# Patient Record
Sex: Female | Born: 1950 | Race: Black or African American | Hispanic: No | Smoking: Never smoker
Health system: Southern US, Community
[De-identification: ages and names within clinical notes are randomized; demographics above are authoritative.]

## PROBLEM LIST (undated history)

## (undated) DIAGNOSIS — I471 Supraventricular tachycardia, unspecified: Secondary | ICD-10-CM

## (undated) DIAGNOSIS — E079 Disorder of thyroid, unspecified: Secondary | ICD-10-CM

## (undated) DIAGNOSIS — I472 Ventricular tachycardia: Secondary | ICD-10-CM

## (undated) DIAGNOSIS — E7800 Pure hypercholesterolemia, unspecified: Secondary | ICD-10-CM

## (undated) DIAGNOSIS — I4729 Other ventricular tachycardia: Secondary | ICD-10-CM

## (undated) DIAGNOSIS — R202 Paresthesia of skin: Secondary | ICD-10-CM

## (undated) DIAGNOSIS — I10 Essential (primary) hypertension: Secondary | ICD-10-CM

## (undated) DIAGNOSIS — H409 Unspecified glaucoma: Secondary | ICD-10-CM

## (undated) DIAGNOSIS — R42 Dizziness and giddiness: Secondary | ICD-10-CM

## (undated) DIAGNOSIS — Z789 Other specified health status: Secondary | ICD-10-CM

## (undated) DIAGNOSIS — I340 Nonrheumatic mitral (valve) insufficiency: Secondary | ICD-10-CM

## (undated) DIAGNOSIS — C50919 Malignant neoplasm of unspecified site of unspecified female breast: Secondary | ICD-10-CM

## (undated) DIAGNOSIS — R9431 Abnormal electrocardiogram [ECG] [EKG]: Secondary | ICD-10-CM

## (undated) HISTORY — DX: Ventricular tachycardia: I47.2

## (undated) HISTORY — PX: BREAST EXCISIONAL BIOPSY: SUR124

## (undated) HISTORY — DX: Other ventricular tachycardia: I47.29

## (undated) HISTORY — DX: Paresthesia of skin: R20.2

## (undated) HISTORY — DX: Dizziness and giddiness: R42

## (undated) HISTORY — DX: Supraventricular tachycardia: I47.1

## (undated) HISTORY — DX: Supraventricular tachycardia, unspecified: I47.10

## (undated) HISTORY — PX: ABDOMINAL HYSTERECTOMY: SHX81

## (undated) HISTORY — DX: Other specified health status: Z78.9

## (undated) HISTORY — DX: Abnormal electrocardiogram (ECG) (EKG): R94.31

## (undated) HISTORY — DX: Nonrheumatic mitral (valve) insufficiency: I34.0

---

## 2003-04-29 DIAGNOSIS — C50919 Malignant neoplasm of unspecified site of unspecified female breast: Secondary | ICD-10-CM

## 2003-04-29 HISTORY — PX: BREAST LUMPECTOMY: SHX2

## 2003-04-29 HISTORY — DX: Malignant neoplasm of unspecified site of unspecified female breast: C50.919

## 2014-06-09 DIAGNOSIS — D493 Neoplasm of unspecified behavior of breast: Secondary | ICD-10-CM | POA: Diagnosis not present

## 2014-06-09 DIAGNOSIS — R7301 Impaired fasting glucose: Secondary | ICD-10-CM | POA: Diagnosis not present

## 2014-06-09 DIAGNOSIS — I10 Essential (primary) hypertension: Secondary | ICD-10-CM | POA: Diagnosis not present

## 2014-06-09 DIAGNOSIS — M818 Other osteoporosis without current pathological fracture: Secondary | ICD-10-CM | POA: Diagnosis not present

## 2014-06-09 DIAGNOSIS — E784 Other hyperlipidemia: Secondary | ICD-10-CM | POA: Diagnosis not present

## 2014-06-12 ENCOUNTER — Other Ambulatory Visit (HOSPITAL_COMMUNITY): Payer: Self-pay | Admitting: Internal Medicine

## 2014-06-12 DIAGNOSIS — I159 Secondary hypertension, unspecified: Secondary | ICD-10-CM

## 2014-06-13 ENCOUNTER — Other Ambulatory Visit: Payer: Self-pay | Admitting: Internal Medicine

## 2014-06-13 DIAGNOSIS — Z853 Personal history of malignant neoplasm of breast: Secondary | ICD-10-CM

## 2014-06-14 ENCOUNTER — Ambulatory Visit (HOSPITAL_COMMUNITY)
Admission: RE | Admit: 2014-06-14 | Discharge: 2014-06-14 | Disposition: A | Discharge status: Home / Self Care | Payer: Medicare Other | Source: Ambulatory Visit | Attending: Internal Medicine | Admitting: Internal Medicine

## 2014-06-14 DIAGNOSIS — I159 Secondary hypertension, unspecified: Secondary | ICD-10-CM | POA: Diagnosis not present

## 2014-06-14 DIAGNOSIS — R0600 Dyspnea, unspecified: Secondary | ICD-10-CM

## 2014-06-14 NOTE — Progress Notes (Signed)
  Echocardiogram 2D Echocardiogram has been performed.  Donata Clay 06/14/2014, 1:28 PM

## 2014-06-16 DIAGNOSIS — H4011X1 Primary open-angle glaucoma, mild stage: Secondary | ICD-10-CM | POA: Diagnosis not present

## 2014-08-08 ENCOUNTER — Ambulatory Visit
Admission: RE | Admit: 2014-08-08 | Discharge: 2014-08-08 | Disposition: A | Discharge status: Home / Self Care | Payer: Medicare Other | Source: Ambulatory Visit | Attending: Internal Medicine | Admitting: Internal Medicine

## 2014-08-08 DIAGNOSIS — Z1231 Encounter for screening mammogram for malignant neoplasm of breast: Secondary | ICD-10-CM | POA: Diagnosis not present

## 2014-08-08 DIAGNOSIS — Z853 Personal history of malignant neoplasm of breast: Secondary | ICD-10-CM

## 2014-09-06 DIAGNOSIS — E784 Other hyperlipidemia: Secondary | ICD-10-CM | POA: Diagnosis not present

## 2014-09-06 DIAGNOSIS — I10 Essential (primary) hypertension: Secondary | ICD-10-CM | POA: Diagnosis not present

## 2014-09-06 DIAGNOSIS — N390 Urinary tract infection, site not specified: Secondary | ICD-10-CM | POA: Diagnosis not present

## 2014-09-06 DIAGNOSIS — R7301 Impaired fasting glucose: Secondary | ICD-10-CM | POA: Diagnosis not present

## 2014-09-07 ENCOUNTER — Emergency Department (HOSPITAL_COMMUNITY): Payer: Medicare Other

## 2014-09-07 ENCOUNTER — Encounter (HOSPITAL_COMMUNITY): Payer: Self-pay | Admitting: *Deleted

## 2014-09-07 ENCOUNTER — Emergency Department (HOSPITAL_COMMUNITY)
Admission: EM | Admit: 2014-09-07 | Discharge: 2014-09-07 | Disposition: A | Discharge status: Home / Self Care | Payer: Medicare Other | Attending: Emergency Medicine | Admitting: Emergency Medicine

## 2014-09-07 DIAGNOSIS — Z79899 Other long term (current) drug therapy: Secondary | ICD-10-CM | POA: Diagnosis not present

## 2014-09-07 DIAGNOSIS — H409 Unspecified glaucoma: Secondary | ICD-10-CM | POA: Insufficient documentation

## 2014-09-07 DIAGNOSIS — I10 Essential (primary) hypertension: Secondary | ICD-10-CM | POA: Insufficient documentation

## 2014-09-07 DIAGNOSIS — E780 Pure hypercholesterolemia: Secondary | ICD-10-CM | POA: Insufficient documentation

## 2014-09-07 DIAGNOSIS — R918 Other nonspecific abnormal finding of lung field: Secondary | ICD-10-CM | POA: Diagnosis not present

## 2014-09-07 DIAGNOSIS — I491 Atrial premature depolarization: Secondary | ICD-10-CM | POA: Diagnosis not present

## 2014-09-07 DIAGNOSIS — R51 Headache: Secondary | ICD-10-CM | POA: Diagnosis not present

## 2014-09-07 DIAGNOSIS — Z880 Allergy status to penicillin: Secondary | ICD-10-CM | POA: Insufficient documentation

## 2014-09-07 DIAGNOSIS — R42 Dizziness and giddiness: Secondary | ICD-10-CM | POA: Diagnosis not present

## 2014-09-07 HISTORY — DX: Unspecified glaucoma: H40.9

## 2014-09-07 HISTORY — DX: Pure hypercholesterolemia, unspecified: E78.00

## 2014-09-07 HISTORY — DX: Essential (primary) hypertension: I10

## 2014-09-07 LAB — PROTIME-INR
INR: 1.11 (ref 0.00–1.49)
Prothrombin Time: 14.5 seconds (ref 11.6–15.2)

## 2014-09-07 LAB — CBC
HEMATOCRIT: 39.4 % (ref 36.0–46.0)
Hemoglobin: 13.5 g/dL (ref 12.0–15.0)
MCH: 31.1 pg (ref 26.0–34.0)
MCHC: 34.3 g/dL (ref 30.0–36.0)
MCV: 90.8 fL (ref 78.0–100.0)
Platelets: 205 10*3/uL (ref 150–400)
RBC: 4.34 MIL/uL (ref 3.87–5.11)
RDW: 11.9 % (ref 11.5–15.5)
WBC: 5.4 10*3/uL (ref 4.0–10.5)

## 2014-09-07 LAB — I-STAT TROPONIN, ED: Troponin i, poc: 0 ng/mL (ref 0.00–0.08)

## 2014-09-07 LAB — BASIC METABOLIC PANEL
Anion gap: 11 (ref 5–15)
BUN: 11 mg/dL (ref 6–20)
CO2: 23 mmol/L (ref 22–32)
CREATININE: 0.8 mg/dL (ref 0.44–1.00)
Calcium: 8.9 mg/dL (ref 8.9–10.3)
Chloride: 105 mmol/L (ref 101–111)
GFR calc non Af Amer: >60 mL/min (ref >60)
Glucose, Bld: 114 mg/dL — ABNORMAL HIGH (ref 65–99)
Potassium: 3.6 mmol/L (ref 3.5–5.1)
Sodium: 139 mmol/L (ref 135–145)

## 2014-09-07 MED ORDER — SULFAMETHOXAZOLE-TRIMETHOPRIM 800-160 MG PO TABS: 1.0000 | ORAL_TABLET | Freq: Two times a day (BID) | ORAL | Status: DC

## 2014-09-07 MED ORDER — NITROFURANTOIN MONOHYD MACRO 100 MG PO CAPS: 100.0000 mg | ORAL_CAPSULE | Freq: Two times a day (BID) | ORAL | Status: DC

## 2014-09-07 NOTE — ED Provider Notes (Signed)
CSN: [id number]     Arrival date & time 09/07/14  1522 History   First MD Initiated Contact with Patient 09/07/14 1639     Chief Complaint  Patient presents with  . Dizziness  . Blurred Vision                                                                                                          HPI Pt started a new medication for a uti yesterday.  After that she started having symptoms of her heart skipping a beat and racing.  She felt lightheaded as well and blurred vision when the episode occurred.  She thinks the symptoms started each time after taking the abx.  It has persisted since yesterday.  No trouble with speech or coordination.  No CP or SOB.  No fevers.  No vomiting.  No history of heart disease.  SHe called her doctor in order to get a different medication and she was told to come to the ED. Past Medical History  Diagnosis Date  . Hypertension   . Glaucoma   . Hypercholesteremia    Past Surgical History  Procedure Laterality Date  . Abdominal hysterectomy    . Breast lumpectomy Bilateral    History reviewed. No pertinent family history. History  Substance Use Topics  . Smoking status: Never Smoker   . Smokeless tobacco: Never Used  . Alcohol Use: No   OB History    No data available     Review of Systems  Constitutional: Negative for fever.  Neurological: Positive for light-headedness and headaches. Negative for speech difficulty.  All other systems reviewed and are negative.     Allergies  Penicillins  Home Medications   Prior to Admission medications   Medication Sig Start Date End Date Taking? Authorizing Provider  ciprofloxacin (CIPRO) 250 MG tablet Take 250 mg by mouth every 12 (twelve) hours. 09/06/14  Yes Historical Provider, MD  latanoprost (XALATAN) 0.005 % ophthalmic solution Place 1 drop into both eyes at bedtime. 08/24/14  Yes Historical Provider, MD  simvastatin (ZOCOR) 20 MG tablet Take 20 mg by mouth at bedtime. 08/24/14  Yes Historical  Provider, MD  valsartan (DIOVAN) 160 MG tablet Take 160 mg by mouth daily. 08/24/14  Yes Historical Provider, MD   BP 161/136 mmHg  Pulse 62  Temp(Src) 98.3 F (36.8 C) (Oral)  Resp 18  Ht 5\' 3"  (1.6 m)  Wt 121 lb (54.885 kg)  BMI 21.44 kg/m2  SpO2 99% Physical Exam  Constitutional: She is oriented to person, place, and time. She appears well-developed and well-nourished. No distress.  HENT:  Head: Normocephalic and atraumatic.  Right Ear: External ear normal.  Left Ear: External ear normal.  Mouth/Throat: Oropharynx is clear and moist.  Eyes: Conjunctivae are normal. Right eye exhibits no discharge. Left eye exhibits no discharge. No scleral icterus.  Neck: Neck supple. No tracheal deviation present.  Cardiovascular: Normal rate, regular rhythm and intact distal pulses.   Pulmonary/Chest: Effort normal and breath sounds normal. No stridor. No respiratory distress. She has no  wheezes. She has no rales.  Abdominal: Soft. Bowel sounds are normal. She exhibits no distension. There is no tenderness. There is no rebound and no guarding.  Musculoskeletal: She exhibits no edema or tenderness.  Neurological: She is alert and oriented to person, place, and time. She has normal strength. No cranial nerve deficit (No facial droop, extraocular movements intact, tongue midline ) or sensory deficit. She exhibits normal muscle tone. She displays no seizure activity. Coordination normal.  No pronator drift bilateral upper extrem, able to hold both legs off bed for 5 seconds, sensation intact in all extremities, no visual field cuts, no left or right sided neglect, normal finger-nose exam bilaterally, no nystagmus noted   Skin: Skin is warm and dry. No rash noted.  Psychiatric: She has a normal mood and affect.  Nursing note and vitals reviewed.   ED Course  Procedures (including critical care time) Labs Review Labs Reviewed  BASIC METABOLIC PANEL - Abnormal; Notable for the following:     Glucose, Bld 114 (*)    All other components within normal limits  CBC  PROTIME-INR  Randolm Idol, ED    Imaging Review Dg Chest 2 View  09/07/2014   CLINICAL DATA:  64 year old female with adverse reaction to bladder infection medication. History of hypertension. Initial encounter.  EXAM: CHEST  2 VIEW  COMPARISON:  None.  FINDINGS: No infiltrate, congestive heart failure or pneumothorax.  Minimal scarring right lung apex.  Heart size top-normal  IMPRESSION: No active cardiopulmonary disease.   Electronically Signed   By: Genia Del M.D.   On: 09/07/2014 16:18   Ct Head Wo Contrast  09/07/2014   CLINICAL DATA:  Dizziness and headaches  EXAM: CT HEAD WITHOUT CONTRAST  TECHNIQUE: Contiguous axial images were obtained from the base of the skull through the vertex without intravenous contrast.  COMPARISON:  None.  FINDINGS: The bony calvarium is intact. The ventricles are of normal size and configuration. No findings to suggest acute hemorrhage, acute infarction or space-occupying mass lesion are noted. Bilateral basal ganglia calcifications are noted.  IMPRESSION: No acute intracranial abnormality noted.   Electronically Signed   By: Inez Catalina M.D.   On: 09/07/2014 17:18     EKG Interpretation   Date/Time:  Thursday Sep 07 2014 15:29:38 EDT Ventricular Rate:  76 PR Interval:  104 QRS Duration: 84 QT Interval:  376 QTC Calculation: 423 R Axis:   49 Text Interpretation:  Sinus rhythm with short PR with Premature  supraventricular complexes Possible Left atrial enlargement Nonspecific ST  abnormality Abnormal ECG No old tracing to compare Confirmed by Maciel Kegg   MD-J, Jahlisa Rossitto ([id number]) on 09/07/2014 4:52:44 PM      MDM   Final diagnoses:  PAC (premature atrial contraction)   Pt started experiencing palpitations after starting cipro.  PAcs noted on her EKG and monitor.  No focal neuro symptoms.  Doubt TIA , stroke.  Will dc home on alternative abx, stop cipro.  Start macrobid.  Pt has  adequate creatinine clearance  At this time there does not appear to be any evidence of an acute emergency medical condition and the patient appears stable for discharge with appropriate outpatient follow up.    Dorie Rank, MD 09/07/14 [phone]

## 2014-09-07 NOTE — ED Notes (Signed)
Pt reports having a chest fluttering and left hand tingling after taking two medications for a bladder infection. Those symptoms have subsided, pt now c/o light headache, blurry vision and dizziness since 1300. Pt did not take the antibiotics today. Pt denies n/v/d. Pt denies any other symptoms.

## 2014-09-07 NOTE — ED Notes (Signed)
Pt is in stable condition upon d/c and ambulates from ED. 

## 2014-10-17 DIAGNOSIS — H4011X2 Primary open-angle glaucoma, moderate stage: Secondary | ICD-10-CM | POA: Diagnosis not present

## 2014-10-19 DIAGNOSIS — R7301 Impaired fasting glucose: Secondary | ICD-10-CM | POA: Diagnosis not present

## 2014-10-19 DIAGNOSIS — Z124 Encounter for screening for malignant neoplasm of cervix: Secondary | ICD-10-CM | POA: Diagnosis not present

## 2014-10-19 DIAGNOSIS — I10 Essential (primary) hypertension: Secondary | ICD-10-CM | POA: Diagnosis not present

## 2014-10-19 DIAGNOSIS — N760 Acute vaginitis: Secondary | ICD-10-CM | POA: Diagnosis not present

## 2014-11-16 DIAGNOSIS — H2513 Age-related nuclear cataract, bilateral: Secondary | ICD-10-CM | POA: Diagnosis not present

## 2014-11-16 DIAGNOSIS — H34239 Retinal artery branch occlusion, unspecified eye: Secondary | ICD-10-CM | POA: Diagnosis not present

## 2014-11-16 DIAGNOSIS — H4011X2 Primary open-angle glaucoma, moderate stage: Secondary | ICD-10-CM | POA: Diagnosis not present

## 2014-11-22 DIAGNOSIS — I10 Essential (primary) hypertension: Secondary | ICD-10-CM | POA: Diagnosis not present

## 2014-11-22 DIAGNOSIS — R7301 Impaired fasting glucose: Secondary | ICD-10-CM | POA: Diagnosis not present

## 2014-11-22 DIAGNOSIS — E784 Other hyperlipidemia: Secondary | ICD-10-CM | POA: Diagnosis not present

## 2014-11-22 DIAGNOSIS — D493 Neoplasm of unspecified behavior of breast: Secondary | ICD-10-CM | POA: Diagnosis not present

## 2014-12-22 DIAGNOSIS — H4011X2 Primary open-angle glaucoma, moderate stage: Secondary | ICD-10-CM | POA: Diagnosis not present

## 2015-01-17 DIAGNOSIS — H2513 Age-related nuclear cataract, bilateral: Secondary | ICD-10-CM | POA: Diagnosis not present

## 2015-01-17 DIAGNOSIS — H4011X2 Primary open-angle glaucoma, moderate stage: Secondary | ICD-10-CM | POA: Diagnosis not present

## 2015-02-21 DIAGNOSIS — I10 Essential (primary) hypertension: Secondary | ICD-10-CM | POA: Diagnosis not present

## 2015-02-21 DIAGNOSIS — Z1389 Encounter for screening for other disorder: Secondary | ICD-10-CM | POA: Diagnosis not present

## 2015-02-21 DIAGNOSIS — E784 Other hyperlipidemia: Secondary | ICD-10-CM | POA: Diagnosis not present

## 2015-02-21 DIAGNOSIS — R7301 Impaired fasting glucose: Secondary | ICD-10-CM | POA: Diagnosis not present

## 2015-03-02 ENCOUNTER — Encounter (HOSPITAL_COMMUNITY): Payer: Self-pay | Admitting: *Deleted

## 2015-03-02 ENCOUNTER — Emergency Department (HOSPITAL_COMMUNITY)
Admission: EM | Admit: 2015-03-02 | Discharge: 2015-03-02 | Disposition: A | Discharge status: Home / Self Care | Payer: Medicare Other | Attending: Emergency Medicine | Admitting: Emergency Medicine

## 2015-03-02 DIAGNOSIS — H539 Unspecified visual disturbance: Secondary | ICD-10-CM

## 2015-03-02 DIAGNOSIS — Z79899 Other long term (current) drug therapy: Secondary | ICD-10-CM | POA: Diagnosis not present

## 2015-03-02 DIAGNOSIS — Z880 Allergy status to penicillin: Secondary | ICD-10-CM | POA: Diagnosis not present

## 2015-03-02 DIAGNOSIS — H409 Unspecified glaucoma: Secondary | ICD-10-CM | POA: Diagnosis not present

## 2015-03-02 DIAGNOSIS — Z8639 Personal history of other endocrine, nutritional and metabolic disease: Secondary | ICD-10-CM | POA: Diagnosis not present

## 2015-03-02 DIAGNOSIS — I10 Essential (primary) hypertension: Secondary | ICD-10-CM | POA: Diagnosis not present

## 2015-03-02 DIAGNOSIS — H538 Other visual disturbances: Secondary | ICD-10-CM | POA: Insufficient documentation

## 2015-03-02 DIAGNOSIS — R42 Dizziness and giddiness: Secondary | ICD-10-CM | POA: Insufficient documentation

## 2015-03-02 LAB — COMPREHENSIVE METABOLIC PANEL
ALBUMIN: 3.6 g/dL (ref 3.5–5.0)
ALT: 19 U/L (ref 14–54)
ANION GAP: 6 (ref 5–15)
AST: 27 U/L (ref 15–41)
Alkaline Phosphatase: 55 U/L (ref 38–126)
BILIRUBIN TOTAL: 1.2 mg/dL (ref 0.3–1.2)
BUN: 9 mg/dL (ref 6–20)
CHLORIDE: 109 mmol/L (ref 101–111)
CO2: 26 mmol/L (ref 22–32)
Calcium: 8.8 mg/dL — ABNORMAL LOW (ref 8.9–10.3)
Creatinine, Ser: 0.77 mg/dL (ref 0.44–1.00)
GFR calc Af Amer: >60 mL/min (ref >60)
GFR calc non Af Amer: >60 mL/min (ref >60)
GLUCOSE: 113 mg/dL — AB (ref 65–99)
POTASSIUM: 3.6 mmol/L (ref 3.5–5.1)
SODIUM: 141 mmol/L (ref 135–145)
Total Protein: 6.4 g/dL — ABNORMAL LOW (ref 6.5–8.1)

## 2015-03-02 LAB — CBC WITH DIFFERENTIAL/PLATELET
BASOS ABS: 0 10*3/uL (ref 0.0–0.1)
BASOS PCT: 0 %
EOS ABS: 0.1 10*3/uL (ref 0.0–0.7)
EOS PCT: 3 %
HCT: 39.2 % (ref 36.0–46.0)
Hemoglobin: 13.5 g/dL (ref 12.0–15.0)
Lymphocytes Relative: 36 %
Lymphs Abs: 1.8 10*3/uL (ref 0.7–4.0)
MCH: 31.3 pg (ref 26.0–34.0)
MCHC: 34.4 g/dL (ref 30.0–36.0)
MCV: 90.7 fL (ref 78.0–100.0)
MONO ABS: 0.3 10*3/uL (ref 0.1–1.0)
MONOS PCT: 6 %
Neutro Abs: 2.7 10*3/uL (ref 1.7–7.7)
Neutrophils Relative %: 55 %
PLATELETS: 211 10*3/uL (ref 150–400)
RBC: 4.32 MIL/uL (ref 3.87–5.11)
RDW: 11.8 % (ref 11.5–15.5)
WBC: 4.9 10*3/uL (ref 4.0–10.5)

## 2015-03-02 LAB — URINE MICROSCOPIC-ADD ON

## 2015-03-02 LAB — URINALYSIS, ROUTINE W REFLEX MICROSCOPIC
Bilirubin Urine: NEGATIVE
Glucose, UA: NEGATIVE mg/dL
Ketones, ur: NEGATIVE mg/dL
Leukocytes, UA: NEGATIVE
NITRITE: NEGATIVE
Protein, ur: NEGATIVE mg/dL
Specific Gravity, Urine: 1.008 (ref 1.005–1.030)
UROBILINOGEN UA: 0.2 mg/dL (ref 0.0–1.0)
pH: 7 (ref 5.0–8.0)

## 2015-03-02 LAB — I-STAT TROPONIN, ED: Troponin i, poc: 0 ng/mL (ref 0.00–0.08)

## 2015-03-02 MED ORDER — SODIUM CHLORIDE 0.9 % IV BOLUS (SEPSIS): 1000.0000 mL | Freq: Once | INTRAVENOUS | Status: DC

## 2015-03-02 MED ORDER — SODIUM CHLORIDE 0.9 % IV BOLUS (SEPSIS)
1000.0000 mL | Freq: Once | INTRAVENOUS | Status: AC
  Administered 2015-03-02: 1000 mL via INTRAVENOUS

## 2015-03-02 NOTE — Discharge Instructions (Signed)
You have been seen today for dizziness. Your lab tests showed no abnormalities. Follow up with PCP for chronic management of this issue. Return to ED should symptoms worsen.

## 2015-03-02 NOTE — ED Notes (Signed)
Pt ambulated in the room with mild dizziness.  Steady on her feet with standby assist.

## 2015-03-02 NOTE — ED Provider Notes (Signed)
CSN: [id number]     Arrival date & time 03/02/15  4166 History   First MD Initiated Contact with Patient 03/02/15 1003     Chief Complaint  Patient presents with  . Dizziness     (Consider location/radiation/quality/duration/timing/severity/associated sxs/prior Treatment) HPI   Victoria Cardenas is a 64 y.o. female, pt with history of HTN, a "leaky heart valve," and Glaucoma, presenting to the ED with dizziness that pt describes as "feeling unsteady" while walking beginning yesterday. Pt states she also felt some onset of abdominal pain in the RUQ and LUQ yesterday, but currently denies any pain. Pt states the dizziness is intermittent and is only present when she stands up and tries to walk. Pt reports "stumbling around" while at the mall yesterday, but denies confusion, difficulty mentating, or sensory deficits. Pt also admits to some vision changes that she states feels like her vision is "fuzzy." Pt last visual acuity test was October 2015. Dizziness is accompanied by some tingling and "heaviness" in her hands. Pt denies falls, recent trauma or surgeries. Denies chest pain, shortness of breath, N/V/C/D, fever/chills, weakness or any other complaints. Pt denies new medications, changes in routine, or new level of physical activity. Pt does state that she has been under the influence of a new significant stressor since Wednesday of this week. Pt denies alcohol or drug use. Last eye pressure test for glaucoma was last month and pressures were normal. Pt was unable to say what kind of heart valve defect she has specifically and this information is not in her record. Pt last ate last night, states she still has a good appetite, and last drank water this morning.   Past Medical History  Diagnosis Date  . Hypertension   . Glaucoma   . Hypercholesteremia    Past Surgical History  Procedure Laterality Date  . Abdominal hysterectomy    . Breast lumpectomy Bilateral    No family history on file. Social  History  Substance Use Topics  . Smoking status: Never Smoker   . Smokeless tobacco: Never Used  . Alcohol Use: No   OB History    No data available     Review of Systems  Respiratory: Negative for shortness of breath.   Cardiovascular: Negative for chest pain.  Gastrointestinal: Negative for nausea, vomiting, abdominal pain, diarrhea, constipation and blood in stool.  Neurological: Positive for dizziness.  All other systems reviewed and are negative.     Allergies  Penicillins and Ciprofloxacin  Home Medications   Prior to Admission medications   Medication Sig Start Date End Date Taking? Authorizing Provider  LUMIGAN 0.01 % SOLN Place 1 drop into both eyes at bedtime. 02/13/15  Yes Historical Provider, MD  valsartan (DIOVAN) 160 MG tablet Take 160 mg by mouth daily at 12 noon.  08/24/14  Yes Historical Provider, MD   BP 135/74 mmHg  Pulse 56  Temp(Src) 98.4 F (36.9 C) (Oral)  Resp 15  SpO2 100% Physical Exam  Constitutional: She is oriented to person, place, and time. She appears well-developed and well-nourished. No distress.  HENT:  Head: Normocephalic and atraumatic.  Eyes: Conjunctivae and EOM are normal.  Right pupil slow to respond.  Cardiovascular: Normal rate, regular rhythm, normal heart sounds and intact distal pulses.   Orthostatic vitals signs show no drop in BP and no rise in pulse.   Pulmonary/Chest: Effort normal and breath sounds normal. No respiratory distress.  Abdominal: Soft. Bowel sounds are normal.  Musculoskeletal: Normal range of motion. She  exhibits no edema or tenderness.  Neurological: She is alert and oriented to person, place, and time. She has normal reflexes.  No sensory deficits. Strength 5/5 in all extremities. Cranial nerves II-XII grossly intact. Pt has no dizziness and is steady when sitting, even with eyes closed. Pt is stable when standing with feet together and eyes closed, but when pt is asked to shift her weight to one foot or  the other, she becomes rather unsteady. When walking, pt has a wobbling gait and takes smaller steps to compensate, but seems to lift her feet off the ground and does not shuffle. Walking heal to toe and on tip toes presents some stability issues.   Skin: Skin is warm and dry. She is not diaphoretic.  Nursing note and vitals reviewed.   ED Course  Procedures (including critical care time) Labs Review Labs Reviewed  COMPREHENSIVE METABOLIC PANEL - Abnormal; Notable for the following:    Glucose, Bld 113 (*)    Calcium 8.8 (*)    Total Protein 6.4 (*)    All other components within normal limits  URINALYSIS, ROUTINE W REFLEX MICROSCOPIC (NOT AT [facility]) - Abnormal; Notable for the following:    Hgb urine dipstick TRACE (*)    All other components within normal limits  CBC WITH DIFFERENTIAL/PLATELET  URINE MICROSCOPIC-ADD ON  I-STAT TROPOININ, ED    Imaging Review No results found. I have personally reviewed and evaluated these images and lab results as part of my medical decision-making.   EKG Interpretation   Date/Time:  Friday March 02 2015 10:11:29 EDT Ventricular Rate:  66 PR Interval:  104 QRS Duration: 80 QT Interval:  406 QTC Calculation: 425 R Axis:   54 Text Interpretation:  Sinus rhythm Short PR interval Minimal ST  depression, inferior leads No significant change since last tracing  Confirmed by Debby Freiberg [phone]) on 03/02/2015 10:25:22 AM      MDM   Final diagnoses:  Dizziness  Visual changes    Victoria Cardenas presents with complaint of dizziness and unsteadiness since yesterday.  Findings and plan of care discussed with Debby Freiberg, MD.  Will test orthostatic vital signs and visual acuity, as well as have labs drawn. Possibly stress related. EKG shows sinus rhythm with no significant changes since last tracing. Patient has gait ataxia and imbalance on exam. CBC and urinalysis revealed no significant abnormalities and nothing that would explain  current clinical symptoms. 12:17 PM patient reassessed. States that she feels much better and her vision is back to normal. Patient ambulated after IV fluids and dizziness resolved. Will discharge patient with instructions to follow up with PCP. Troponin is negative. Doubt cardiac source for dizziness.  Lorayne Bender, PA-C 03/02/15 1238  Debby Freiberg, MD 03/03/15 [phone]

## 2015-03-02 NOTE — ED Notes (Signed)
Pt presents via POV c/o dizziness and off balance when she walks, beginning yesterday.  Bilateral hands tingling and feel heavy as well.  Pt initially thought it was her BP.  Reports hx of these symptoms but not as severe.  Hx: HTN, HLD.  Pt a x 4, NAD. PA at bedside.

## 2015-04-18 DIAGNOSIS — H401131 Primary open-angle glaucoma, bilateral, mild stage: Secondary | ICD-10-CM | POA: Diagnosis not present

## 2015-04-18 DIAGNOSIS — H2513 Age-related nuclear cataract, bilateral: Secondary | ICD-10-CM | POA: Diagnosis not present

## 2015-06-29 ENCOUNTER — Other Ambulatory Visit: Payer: Self-pay | Admitting: Internal Medicine

## 2015-06-29 DIAGNOSIS — E2839 Other primary ovarian failure: Secondary | ICD-10-CM

## 2015-07-25 ENCOUNTER — Ambulatory Visit (INDEPENDENT_AMBULATORY_CARE_PROVIDER_SITE_OTHER): Payer: Medicare Other | Admitting: Cardiology

## 2015-07-25 ENCOUNTER — Encounter: Payer: Self-pay | Admitting: Cardiology

## 2015-07-25 VITALS — BP 130/82 | HR 58 | Ht 63.0 in | Wt 121.0 lb

## 2015-07-25 DIAGNOSIS — I340 Nonrheumatic mitral (valve) insufficiency: Secondary | ICD-10-CM

## 2015-07-25 DIAGNOSIS — I119 Hypertensive heart disease without heart failure: Secondary | ICD-10-CM | POA: Diagnosis not present

## 2015-07-25 DIAGNOSIS — E785 Hyperlipidemia, unspecified: Secondary | ICD-10-CM

## 2015-07-25 NOTE — Progress Notes (Signed)
Patient ID: Victoria Cardenas, female   DOB: Dec 27, 1950, 65 y.o.   MRN: [id number]     Cardiology Office Note    Date:  07/25/2015   ID:  Victoria Cardenas, DOB March 15, 1951, MRN [id number]  PCP:  Pcp Not In System  Cardiologist:  Dorothy Spark, MD   Chief complain: Establish cardiology care.  History of Present Illness:  Victoria Cardenas is a 65 y.o. female who is a very pleasant patient who recently moved here from [place] to take care of her elderly mother. The patient has history of hypertension treated with Diovan, and hyperlipidemia. She was previously treated with Lipitor for 2 years but developed muscle pain. After that patient try to be very strict with her diet and exercise and her cholesterol improved and was at goal at her primary care physician office about a month ago. She is very active, in fact she doesn't have a car and walks everywhere. With that she doesn't experience any chest pain or shortness of breath no palpitation or syncope. She denies any lower extremity edema, orthopnea.  File in [place] she underwent an exercise treadmill stress test that was positive for 1 mm ST depressions however follow-up exercise nuclear stress test showed no ischemia and no prior scar.  Past Medical History  Diagnosis Date  . Hypertension   . Glaucoma   . Hypercholesteremia     Past Surgical History  Procedure Laterality Date  . Abdominal hysterectomy    . Breast lumpectomy Bilateral     Outpatient Prescriptions Prior to Visit  Medication Sig Dispense Refill  . LUMIGAN 0.01 % SOLN Place 1 drop into both eyes at bedtime.  6  . valsartan (DIOVAN) 160 MG tablet Take 160 mg by mouth daily at 12 noon.   4   No facility-administered medications prior to visit.     Allergies:   Penicillins and Ciprofloxacin   Social History   Social History  . Marital Status: Single    Spouse Name: N/A  . Number of Children: N/A  . Years of Education: N/A   Social History Main Topics  . Smoking  status: Never Smoker   . Smokeless tobacco: Never Used  . Alcohol Use: No  . Drug Use: No  . Sexual Activity: Not Asked   Other Topics Concern  . None   Social History Narrative     Family History:  The patient's family history is not on file.   ROS:   Please see the history of present illness.    ROS All other systems reviewed and are negative.   PHYSICAL EXAM:   VS:  BP 130/82 mmHg  Pulse 58  Ht 5\' 3"  (1.6 m)  Wt 121 lb (54.885 kg)  BMI 21.44 kg/m2   GEN: Well nourished, well developed, in no acute distress HEENT: normal Neck: no JVD, carotid bruits, or masses Cardiac: RRR; no murmurs, rubs, or gallops,no edema  Respiratory:  clear to auscultation bilaterally, normal work of breathing GI: soft, nontender, nondistended, + BS MS: no deformity or atrophy Skin: warm and dry, no rash Neuro:  Alert and Oriented x 3, Strength and sensation are intact Psych: euthymic mood, full affect  Wt Readings from Last 3 Encounters:  07/25/15 121 lb (54.885 kg)  09/07/14 121 lb (54.885 kg)      Studies/Labs Reviewed:   EKG:  Normal sinus rhythm normal EKG.  Recent Labs: 03/02/2015: ALT 19; BUN 9; Creatinine, Ser 0.77; Hemoglobin 13.5; Platelets 211; Potassium 3.6; Sodium  141    TTE: 06/14/2014 Left ventricle: The cavity size was normal. Systolic function was normal. The estimated ejection fraction was in the range of 60% to 65%. Wall motion was normal; there were no regional wall motion abnormalities. ------------------------------------------------------------------- Aortic valve:  Trileaflet. Mild calcification, consistent with sclerosis. Mobility was not restricted. Doppler: Transvalvular velocity was within the normal range. There was no stenosis. There was no regurgitation. ------------------------------------------------------------------- Aorta: Aortic root: The aortic root was normal in  size. ------------------------------------------------------------------- Mitral valve:  Structurally normal valve.  Mobility was not restricted. Doppler: Transvalvular velocity was within the normal range. There was no evidence for stenosis. There was mild regurgitation.  Peak gradient (D): 3 mm Hg.   ASSESSMENT/PLAN:    1. 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 Most recent EKG didn't show any signs of LVH, most recent echocardiogram didn't show any LVH and no diastolic dysfunction. LVEF normal.  2. Hyperlipidemia on diet only will continue as all lipids at goal last months.  3. Mild mitral regurgitation - we will follow.   Signed, Dorothy Spark, MD  07/25/2015 10:14 AM    [facility] [place], [place], [place]  [address] Phone: [phone]; Fax: [phone]

## 2015-08-07 ENCOUNTER — Ambulatory Visit
Admission: RE | Admit: 2015-08-07 | Discharge: 2015-08-07 | Disposition: A | Discharge status: Home / Self Care | Payer: Medicare Other | Source: Ambulatory Visit | Attending: Internal Medicine | Admitting: Internal Medicine

## 2015-08-07 DIAGNOSIS — E2839 Other primary ovarian failure: Secondary | ICD-10-CM

## 2015-09-03 ENCOUNTER — Other Ambulatory Visit: Payer: Self-pay

## 2015-09-03 DIAGNOSIS — Z1231 Encounter for screening mammogram for malignant neoplasm of breast: Secondary | ICD-10-CM

## 2015-09-17 ENCOUNTER — Ambulatory Visit
Admission: RE | Admit: 2015-09-17 | Discharge: 2015-09-17 | Disposition: A | Discharge status: Home / Self Care | Payer: Medicare Other | Source: Ambulatory Visit

## 2015-09-17 DIAGNOSIS — Z1231 Encounter for screening mammogram for malignant neoplasm of breast: Secondary | ICD-10-CM

## 2015-09-19 ENCOUNTER — Other Ambulatory Visit: Payer: Self-pay | Admitting: Internal Medicine

## 2015-09-19 DIAGNOSIS — R928 Other abnormal and inconclusive findings on diagnostic imaging of breast: Secondary | ICD-10-CM

## 2015-09-26 ENCOUNTER — Ambulatory Visit
Admission: RE | Admit: 2015-09-26 | Discharge: 2015-09-26 | Disposition: A | Discharge status: Home / Self Care | Payer: Medicare Other | Source: Ambulatory Visit | Attending: Internal Medicine | Admitting: Internal Medicine

## 2015-09-26 ENCOUNTER — Other Ambulatory Visit: Payer: Self-pay | Admitting: Internal Medicine

## 2015-09-26 DIAGNOSIS — R928 Other abnormal and inconclusive findings on diagnostic imaging of breast: Secondary | ICD-10-CM

## 2015-09-26 DIAGNOSIS — N6320 Unspecified lump in the left breast, unspecified quadrant: Secondary | ICD-10-CM

## 2015-10-05 ENCOUNTER — Other Ambulatory Visit: Payer: Self-pay | Admitting: Internal Medicine

## 2015-10-05 DIAGNOSIS — N6320 Unspecified lump in the left breast, unspecified quadrant: Secondary | ICD-10-CM

## 2015-10-10 ENCOUNTER — Ambulatory Visit
Admission: RE | Admit: 2015-10-10 | Discharge: 2015-10-10 | Disposition: A | Discharge status: Home / Self Care | Payer: Medicare Other | Source: Ambulatory Visit | Attending: Internal Medicine | Admitting: Internal Medicine

## 2015-10-10 DIAGNOSIS — N6320 Unspecified lump in the left breast, unspecified quadrant: Secondary | ICD-10-CM

## 2015-10-10 HISTORY — PX: BREAST EXCISIONAL BIOPSY: SUR124

## 2015-11-08 ENCOUNTER — Other Ambulatory Visit: Payer: Self-pay | Admitting: Surgery

## 2015-11-08 DIAGNOSIS — E041 Nontoxic single thyroid nodule: Secondary | ICD-10-CM

## 2015-11-13 ENCOUNTER — Other Ambulatory Visit: Payer: Self-pay | Admitting: Surgery

## 2015-11-13 ENCOUNTER — Inpatient Hospital Stay
Admission: RE | Admit: 2015-11-13 | Discharge: 2015-11-13 | Disposition: A | Discharge status: Home / Self Care | Payer: Self-pay | Source: Ambulatory Visit | Attending: Surgery | Admitting: Surgery

## 2015-11-13 DIAGNOSIS — E041 Nontoxic single thyroid nodule: Secondary | ICD-10-CM

## 2015-11-27 ENCOUNTER — Ambulatory Visit
Admission: RE | Admit: 2015-11-27 | Discharge: 2015-11-27 | Disposition: A | Discharge status: Home / Self Care | Payer: Medicare Other | Source: Ambulatory Visit | Attending: Surgery | Admitting: Surgery

## 2015-11-27 ENCOUNTER — Other Ambulatory Visit (HOSPITAL_COMMUNITY)
Admission: RE | Admit: 2015-11-27 | Discharge: 2015-11-27 | Disposition: A | Discharge status: Home / Self Care | Payer: Medicare Other | Source: Ambulatory Visit | Attending: General Surgery | Admitting: General Surgery

## 2015-11-27 DIAGNOSIS — E041 Nontoxic single thyroid nodule: Secondary | ICD-10-CM

## 2016-02-06 DIAGNOSIS — H938X1 Other specified disorders of right ear: Secondary | ICD-10-CM | POA: Insufficient documentation

## 2016-02-06 DIAGNOSIS — H903 Sensorineural hearing loss, bilateral: Secondary | ICD-10-CM | POA: Insufficient documentation

## 2016-02-27 DIAGNOSIS — H6981 Other specified disorders of Eustachian tube, right ear: Secondary | ICD-10-CM | POA: Insufficient documentation

## 2016-04-04 ENCOUNTER — Other Ambulatory Visit: Payer: Self-pay | Admitting: Internal Medicine

## 2016-04-04 DIAGNOSIS — N6320 Unspecified lump in the left breast, unspecified quadrant: Secondary | ICD-10-CM

## 2016-04-11 ENCOUNTER — Other Ambulatory Visit: Payer: Self-pay | Admitting: Surgery

## 2016-04-11 DIAGNOSIS — E041 Nontoxic single thyroid nodule: Secondary | ICD-10-CM

## 2016-04-14 ENCOUNTER — Ambulatory Visit
Admission: RE | Admit: 2016-04-14 | Discharge: 2016-04-14 | Disposition: A | Discharge status: Home / Self Care | Payer: Medicare Other | Source: Ambulatory Visit | Attending: Internal Medicine | Admitting: Internal Medicine

## 2016-04-14 DIAGNOSIS — N6320 Unspecified lump in the left breast, unspecified quadrant: Secondary | ICD-10-CM

## 2016-05-07 ENCOUNTER — Ambulatory Visit
Admission: RE | Admit: 2016-05-07 | Discharge: 2016-05-07 | Disposition: A | Discharge status: Home / Self Care | Payer: Medicare Other | Source: Ambulatory Visit | Attending: Surgery | Admitting: Surgery

## 2016-05-07 DIAGNOSIS — E041 Nontoxic single thyroid nodule: Secondary | ICD-10-CM

## 2016-07-28 ENCOUNTER — Encounter: Payer: Self-pay | Admitting: Cardiology

## 2016-07-28 ENCOUNTER — Ambulatory Visit (INDEPENDENT_AMBULATORY_CARE_PROVIDER_SITE_OTHER): Payer: Medicare Other | Admitting: Cardiology

## 2016-07-28 VITALS — BP 124/64 | HR 64 | Ht 63.0 in | Wt 121.0 lb

## 2016-07-28 DIAGNOSIS — I119 Hypertensive heart disease without heart failure: Secondary | ICD-10-CM | POA: Diagnosis not present

## 2016-07-28 DIAGNOSIS — I340 Nonrheumatic mitral (valve) insufficiency: Secondary | ICD-10-CM | POA: Diagnosis not present

## 2016-07-28 MED ORDER — VALSARTAN 160 MG PO TABS: 160.0000 mg | ORAL_TABLET | Freq: Every day | ORAL | 6 refills | Status: DC

## 2016-07-28 NOTE — Patient Instructions (Signed)
Medication Instructions:   Your physician recommends that you continue on your current medications as directed. Please refer to the Current Medication list given to you today.    Follow-Up:  Your physician wants you to follow-up in: 2 [place] will receive a reminder letter in the mail two months in advance. If you don't receive a letter, please call our office to schedule the follow-up appointment.        If you need a refill on your cardiac medications before your next appointment, please call your pharmacy.

## 2016-07-28 NOTE — Progress Notes (Signed)
Patient ID: Victoria Cardenas, female   DOB: 1950-12-09, 66 y.o.   MRN: [id number]     Cardiology Office Note    Date:  07/28/2016   ID:  Victoria Cardenas, DOB 1951/02/10, MRN [id number]  PCP:  Victoria Fendt, MD  Cardiologist:  Victoria Dawley, MD   Chief complain: 1 year follow up  History of Present Illness:  Victoria Cardenas is a 66 y.o. female who is a very pleasant patient who recently moved here from [place] to take care of her elderly mother. The patient has history of hypertension treated with Diovan, and hyperlipidemia. She was previously treated with Lipitor for 2 years but developed muscle pain. After that patient try to be very strict with her diet and exercise and her cholesterol improved and was at goal at her primary care physician office about a month ago. She is very active, in fact she doesn't have a car and walks everywhere. With that she doesn't experience any chest pain or shortness of breath no palpitation or syncope. She denies any lower extremity edema, orthopnea.  While in [place] she underwent an exercise treadmill stress test that was positive for 1 mm ST depressions however follow-up exercise nuclear stress test showed no ischemia and no prior scar.  07/28/2016 - 1 year follow up, the patient feels great, she walks a lot, uses public transportation as she doesn't have a car. She denies any chest pain, SOB, palpitations, dizziness or syncope. She has been taking Diovan and has no side effects. She has had labs checked by her PCP and they were all at goal. She complains of muscle cramps at night, she stays hydrated and drinks 8 cups of water/day.   Past Medical History:  Diagnosis Date  . Glaucoma   . Hypercholesteremia   . Hypertension     Past Surgical History:  Procedure Laterality Date  . ABDOMINAL HYSTERECTOMY    . BREAST LUMPECTOMY Bilateral     Outpatient Medications Prior to Visit  Medication Sig Dispense Refill  . LUMIGAN 0.01 % SOLN Place 1 drop into both eyes  at bedtime.  6  . valsartan (DIOVAN) 160 MG tablet Take 160 mg by mouth daily at 12 noon.   4   No facility-administered medications prior to visit.      Allergies:   Penicillins and Ciprofloxacin   Social History   Social History  . Marital status: Single    Spouse name: N/A  . Number of children: N/A  . Years of education: N/A   Social History Main Topics  . Smoking status: Never Smoker  . Smokeless tobacco: Never Used  . Alcohol use No  . Drug use: No  . Sexual activity: Not Asked   Other Topics Concern  . None   Social History Narrative  . None     Family History:  The patient's family history is not on file.   ROS:   Please see the history of present illness.    ROS All other systems reviewed and are negative.   PHYSICAL EXAM:   VS:  BP 124/64   Pulse 64   Ht 5\' 3"  (1.6 m)   Wt 121 lb (54.9 kg)   BMI 21.43 kg/m    GEN: Well nourished, well developed, in no acute distress  HEENT: normal  Neck: no JVD, carotid bruits, or masses Cardiac: RRR; no murmurs, rubs, or gallops,no edema  Respiratory:  clear to auscultation bilaterally, normal work of breathing GI: soft, nontender,  nondistended, + BS MS: no deformity or atrophy  Skin: warm and dry, no rash Neuro:  Alert and Oriented x 3, Strength and sensation are intact Psych: euthymic mood, full affect  Wt Readings from Last 3 Encounters:  07/28/16 121 lb (54.9 kg)  07/25/15 121 lb (54.9 kg)  09/07/14 121 lb (54.9 kg)      Studies/Labs Reviewed:   EKG:  Performed today for 2018 and shows normal sinus rhythm normal EKG unchanged from prior.  Recent Labs: No results found for requested labs within last 8760 hours.    TTE: 06/14/2014 Left ventricle: The cavity size was normal. Systolic function was normal. The estimated ejection fraction was in the range of 60% to 65%. Wall motion was normal; there were no regional wall  motion abnormalities. ------------------------------------------------------------------- Aortic valve:  Trileaflet. Mild calcification, consistent with sclerosis. Mobility was not restricted. Doppler: Transvalvular velocity was within the normal range. There was no stenosis. There was no regurgitation. ------------------------------------------------------------------- Aorta: Aortic root: The aortic root was normal in size. ------------------------------------------------------------------- Mitral valve:  Structurally normal valve.  Mobility was not restricted. Doppler: Transvalvular velocity was within the normal range. There was no evidence for stenosis. There was mild regurgitation.  Peak gradient (D): 3 mm Hg.   ASSESSMENT/PLAN:    1. 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 Her EKG is completely normal and shows no signs of LVH. Echocardiograms in 2016 showed normal LVEF and no LVH. The patient is very active and asymptomatic, her labs were recently followed by her primary care physician and were normal. We will follow in 2 years.  2. Mitral regurgitation - only mild, minimal murmur, we will follow.  Signed, Victoria Dawley, MD  07/28/2016 10:10 AM    [facility] [place], [place], [place]  [address] Phone: [phone]; Fax: [phone]

## 2016-11-13 ENCOUNTER — Other Ambulatory Visit: Payer: Self-pay | Admitting: Internal Medicine

## 2016-11-13 DIAGNOSIS — Z1231 Encounter for screening mammogram for malignant neoplasm of breast: Secondary | ICD-10-CM

## 2016-12-04 ENCOUNTER — Ambulatory Visit (HOSPITAL_COMMUNITY)
Admission: EM | Admit: 2016-12-04 | Discharge: 2016-12-04 | Disposition: A | Discharge status: Home / Self Care | Payer: Medicare Other | Attending: Family Medicine | Admitting: Family Medicine

## 2016-12-04 ENCOUNTER — Encounter (HOSPITAL_COMMUNITY): Payer: Self-pay | Admitting: *Deleted

## 2016-12-04 DIAGNOSIS — R350 Frequency of micturition: Secondary | ICD-10-CM | POA: Diagnosis present

## 2016-12-04 DIAGNOSIS — N390 Urinary tract infection, site not specified: Secondary | ICD-10-CM | POA: Insufficient documentation

## 2016-12-04 LAB — POCT URINALYSIS DIP (DEVICE)
BILIRUBIN URINE: NEGATIVE
Glucose, UA: NEGATIVE mg/dL
KETONES UR: NEGATIVE mg/dL
Nitrite: NEGATIVE
Protein, ur: NEGATIVE mg/dL
UROBILINOGEN UA: 0.2 mg/dL (ref 0.0–1.0)
pH: 7 (ref 5.0–8.0)

## 2016-12-04 MED ORDER — NITROFURANTOIN MONOHYD MACRO 100 MG PO CAPS: 100.0000 mg | ORAL_CAPSULE | Freq: Two times a day (BID) | ORAL | 0 refills | Status: DC

## 2016-12-04 NOTE — ED Notes (Signed)
Pt already given UA

## 2016-12-04 NOTE — ED Triage Notes (Signed)
Pt   Reports   Started   [place]      11/25/1956     And   Since  Has  Had  Frequent   And  Scanty  Urination   And  Stiffness  In neck      Symptoms  Started  After  Starting  The  meds    Stopped  The  meds   Yesterday  Lightheaded  When  Gets  Up

## 2016-12-04 NOTE — ED Provider Notes (Signed)
  [place]   [id number] 12/04/16 Arrival Time: 6389  ASSESSMENT & PLAN:  1. Lower urinary tract infectious disease   2. Urinary frequency     Meds ordered this encounter  Medications  . nitrofurantoin, macrocrystal-monohydrate, (MACROBID) 100 MG capsule    Sig: Take 1 capsule (100 mg total) by mouth 2 (two) times daily.    Dispense:  10 capsule    Refill:  0    Order Specific Question:   Supervising Provider    Answer:   Sherlene Shams [[id number]]   UA cx  Reviewed expectations re: course of current medical issues. Questions answered. Outlined signs and symptoms indicating need for more acute intervention. Patient verbalized understanding. After Visit Summary given.   SUBJECTIVE:  Victoria Cardenas is a 66 y.o. female who presents with complaint of urinary frequency  ROS: As per HPI.   OBJECTIVE:  Vitals:   12/04/16 1319 12/04/16 1320  BP: 138/79   Pulse: 72   Resp: 16   Temp: 98.7 F (37.1 C)   TempSrc: Oral   SpO2: 97%   Weight:  125 lb (56.7 kg)  Height:  5\' 3"  (1.6 m)     General appearance: alert; no distress HEENT: normocephalic; atraumatic; conjunctivae normal; TMs normal; nasal mucosa normal; oral mucosa normal Neck: supple Lungs: clear to auscultation bilaterally Heart: regular rate and rhythm Abdomen: soft, non-tender; bowel sounds normal; no masses or organomegaly; no guarding or rebound tenderness Back: no CVA tenderness Extremities: no cyanosis or edema; symmetrical with no gross deformities Skin: warm and dry Neurologic: normal symmetric reflexes; normal gait Psychological:  alert and cooperative; normal mood and affect  Results for orders placed or performed during the hospital encounter of 12/04/16  POCT urinalysis dip (device)  Result Value Ref Range   Glucose, UA NEGATIVE NEGATIVE mg/dL   Bilirubin Urine NEGATIVE NEGATIVE   Ketones, ur NEGATIVE NEGATIVE mg/dL   Specific Gravity, Urine <=1.005 1.005 - 1.030   Hgb urine  dipstick TRACE (A) NEGATIVE   pH 7.0 5.0 - 8.0   Protein, ur NEGATIVE NEGATIVE mg/dL   Urobilinogen, UA 0.2 0.0 - 1.0 mg/dL   Nitrite NEGATIVE NEGATIVE   Leukocytes, UA TRACE (A) NEGATIVE    Labs Reviewed  POCT URINALYSIS DIP (DEVICE) - Abnormal; Notable for the following:       Result Value   Hgb urine dipstick TRACE (*)    Leukocytes, UA TRACE (*)    All other components within normal limits  URINE CULTURE    No results found.  Allergies PCN cipro  PMHx, SurgHx, SocialHx, Medications, and Allergies were reviewed in the Visit Navigator and updated as appropriate.      Lysbeth Penner, [place] 12/04/16 1526

## 2016-12-06 LAB — URINE CULTURE: Culture: <10000 — AB

## 2016-12-18 ENCOUNTER — Emergency Department (HOSPITAL_COMMUNITY): Payer: Medicare Other

## 2016-12-18 ENCOUNTER — Encounter (HOSPITAL_COMMUNITY): Payer: Self-pay

## 2016-12-18 ENCOUNTER — Emergency Department (HOSPITAL_COMMUNITY)
Admission: EM | Admit: 2016-12-18 | Discharge: 2016-12-19 | Disposition: A | Discharge status: Home / Self Care | Payer: Medicare Other | Attending: Emergency Medicine | Admitting: Emergency Medicine

## 2016-12-18 DIAGNOSIS — R002 Palpitations: Secondary | ICD-10-CM | POA: Insufficient documentation

## 2016-12-18 DIAGNOSIS — I10 Essential (primary) hypertension: Secondary | ICD-10-CM | POA: Diagnosis not present

## 2016-12-18 DIAGNOSIS — Z79899 Other long term (current) drug therapy: Secondary | ICD-10-CM | POA: Diagnosis not present

## 2016-12-18 LAB — CBC
HEMATOCRIT: 42.5 % (ref 36.0–46.0)
HEMOGLOBIN: 14.7 g/dL (ref 12.0–15.0)
MCH: 31.1 pg (ref 26.0–34.0)
MCHC: 34.6 g/dL (ref 30.0–36.0)
MCV: 89.9 fL (ref 78.0–100.0)
Platelets: 266 10*3/uL (ref 150–400)
RBC: 4.73 MIL/uL (ref 3.87–5.11)
RDW: 11.7 % (ref 11.5–15.5)
WBC: 5.9 10*3/uL (ref 4.0–10.5)

## 2016-12-18 LAB — BASIC METABOLIC PANEL
ANION GAP: 11 (ref 5–15)
BUN: 8 mg/dL (ref 6–20)
CALCIUM: 9.2 mg/dL (ref 8.9–10.3)
CO2: 24 mmol/L (ref 22–32)
Chloride: 105 mmol/L (ref 101–111)
Creatinine, Ser: 0.7 mg/dL (ref 0.44–1.00)
GFR calc Af Amer: >60 mL/min (ref >60)
GLUCOSE: 122 mg/dL — AB (ref 65–99)
POTASSIUM: 3.6 mmol/L (ref 3.5–5.1)
SODIUM: 140 mmol/L (ref 135–145)

## 2016-12-18 LAB — I-STAT TROPONIN, ED
TROPONIN I, POC: 0 ng/mL (ref 0.00–0.08)
Troponin i, poc: 0 ng/mL (ref 0.00–0.08)

## 2016-12-18 NOTE — ED Notes (Signed)
ED Provider at bedside. 

## 2016-12-18 NOTE — Discharge Instructions (Signed)
Return here as needed.  Follow-up on Tuesday at the cardiology office at 8:30 AM

## 2016-12-18 NOTE — ED Notes (Signed)
New BP med prescribed and pt has been taking it for a week and has been having side effects. Swelling bilateral, pt "felt flutter in chest". Pt states she does feel light headed currently and at home while taking med. Pt stated that "I feel a little shaky and short of breath". Pt denies SOB currently. Pt denies pain.

## 2016-12-18 NOTE — ED Triage Notes (Signed)
Pt presents for evaluation of palpitations starting yesterday. Pt reports recently changed BP meds approximately 1 week ago. Pt reports fluttering to chest, denies CP/SOB. Reports PCP was out of office yesterday.

## 2016-12-22 NOTE — ED Provider Notes (Signed)
[facility] DEPT Provider Note   CSN: [id number] Arrival date & time: 12/18/16  1047     History   Chief Complaint Chief Complaint  Patient presents with  . Palpitations    HPI Victoria Cardenas is a 66 y.o. female.  HPI Patient presents to the emergency department with palpitations that started one week ago.  She states it started slightly after she started taking a new blood pressure medication.  The patient states that she occasionally feels lightheaded.  She states she continue take the medication feeling that she does need to adjust patient states that she has not had any shortness of breath or chest pain. The patient denies chest pain, shortness of breath, headache,blurred vision, neck pain, fever, cough, weakness, numbness, dizziness, anorexia, edema, abdominal pain, nausea, vomiting, diarrhea, rash, back pain, dysuria, hematemesis, bloody stool, near syncope, or syncope.  Patient states nothing seems to make the condition better or worse Past Medical History:  Diagnosis Date  . Glaucoma   . Hypercholesteremia   . Hypertension     Patient Active Problem List   Diagnosis Date Noted  . Eustachian tube dysfunction, right 02/27/2016  . Ear pressure, right 02/06/2016  . 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 ABDOMINAL HYSTERECTOMY    . BREAST LUMPECTOMY Bilateral     OB History    No data available       Home Medications    Prior to Admission medications   Medication Sig Start Date End Date Taking? Authorizing Provider  amLODipine (NORVASC) 10 MG tablet Take 10 mg by mouth daily. 12/05/16  Yes [provider]  LUMIGAN 0.01 % SOLN Place 1 drop into both eyes at bedtime. 02/13/15  Yes [provider]  nitrofurantoin, macrocrystal-monohydrate, (MACROBID) 100 MG capsule Take 1 capsule (100 mg total) by mouth 2 (two) times daily. Patient not taking: Reported on 12/18/2016 12/04/16    Lysbeth Penner, FNP  valsartan (DIOVAN) 160 MG tablet Take 1 tablet (160 mg total) by mouth daily at 12 noon. Patient not taking: Reported on 12/18/2016 07/28/16   Dorothy Spark, MD    Family History No family history on file.  Social History Social History  Substance Use Topics  . Smoking status: Never Smoker  . Smokeless tobacco: Never Used  . Alcohol use No     Allergies   Penicillins and Ciprofloxacin   Review of Systems Review of Systems All other systems negative except as documented in the HPI. All pertinent positives and negatives as reviewed in the HPI.  Physical Exam Updated Vital Signs BP (!) 153/74 (BP Location: Right Arm)   Pulse 60   Temp 98 F (36.7 C) (Oral)   Resp 16   Ht 5\' 3"  (1.6 m)   Wt 56.7 kg (125 lb)   SpO2 100%   BMI 22.14 kg/m   Physical Exam  Constitutional: She is oriented to person, place, and time. She appears well-developed and well-nourished. No distress.  HENT:  Head: Normocephalic and atraumatic.  Mouth/Throat: Oropharynx is clear and moist.  Eyes: Pupils are equal, round, and reactive to light.  Neck: Normal range of motion. Neck supple.  Cardiovascular: Normal rate, regular rhythm and normal heart sounds.  Exam reveals no gallop and no friction rub.   No murmur heard. Pulmonary/Chest: Effort normal and breath sounds normal. No respiratory distress. She has no wheezes.  Abdominal: Soft. Bowel sounds are normal. She exhibits no distension. There  is no tenderness.  Neurological: She is alert and oriented to person, place, and time. She exhibits normal muscle tone. Coordination normal.  Skin: Skin is warm and dry. Capillary refill takes less than 2 seconds. No rash noted. No erythema.  Psychiatric: She has a normal mood and affect. Her behavior is normal.  Nursing note and vitals reviewed.    ED Treatments / Results  Labs (all labs ordered are listed, but only abnormal results are displayed) Labs Reviewed  BASIC  METABOLIC PANEL - Abnormal; Notable for the following:       Result Value   Glucose, Bld 122 (*)    All other components within normal limits  CBC  I-STAT TROPONIN, ED  I-STAT TROPONIN, ED    EKG  EKG Interpretation  Date/Time:  Thursday December 18 2016 10:48:37 EDT Ventricular Rate:  73 PR Interval:  108 QRS Duration: 84 QT Interval:  386 QTC Calculation: 425 R Axis:   36 Text Interpretation:  Sinus rhythm with short PR Otherwise normal ECG Nonspecific ST changes No significant change since last tracing Confirmed by Gareth Morgan [phone]) on 12/18/2016 2:39:19 PM       Radiology No results found.  Procedures Procedures (including critical care time)  Medications Ordered in ED Medications - No data to display   Initial Impression / Assessment and Plan / ED Course  I have reviewed the triage vital signs and the nursing notes.  Pertinent labs & imaging results that were available during my care of the patient were reviewed by me and considered in my medical decision making (see chart for details).     I spoke with the cardiologist, who will see the patient on Tuesday for further evaluation.  The patient is been stable.  She does not have any chest pain or shortness of breath in the emergency department.  Patient states that she feels no symptoms at this time.  I advised the patient to return here for any worsening in her condition.  The patient agrees the plan and all questions were answered  Final Clinical Impressions(s) / ED Diagnoses   Final diagnoses:  Palpitations    New Prescriptions Discharge Medication List as of 12/18/2016  4:39 PM       Dalia Heading, PA-C 12/22/16 1713    Jessy Cybulski, Harrell Gave, PA-C 12/22/16 1714    Gareth Morgan, MD 12/22/16 2322

## 2016-12-22 NOTE — Progress Notes (Signed)
Cardiology Office Note    Date:  12/23/2016  ID:  [name], DOB 1950-06-08, MRN [id number] PCP:  Victoria Ebbs, MD  Cardiologist:  [name]   Chief Complaint: f/u palpitations  History of Present Illness:  Victoria Cardenas is a 66 y.o. female with history of HTN, hyperlipidemia, glaucoma, mild MR who presents to f/u palpitations. She's previously followed with Dr. [name] for history of abnormal stress test. Reportedly while in [place] in the past she underwent an exercise treadmill stress test that was positive for 1 mm ST depressions however follow-up exercise nuclear stress test showed no ischemia and no prior scar. There is a report scanned in from 2013 indicating normal imaging, LVEF >65%, "during treadmill exercise and Adenosine infusion, the patient developed dyspnea, rare PVCs and positive ST segment response without chest pain." It sounds like this was managed conservatively. 2D echo 2016 showed EF 60-65%, mild aortic sclerosis, mild MR.   She recently was switched from valsartan onto amlodipine 10mg  daily due to recall. After that switch (approx 1 week ago), she began noticing increased LEE as well as sensation of heart pounding (not racing, just pounding harder). She was also feeling intermittently dizzy. She tried to go to her PCP's office but he was not in so she went on to the ED. CXR NAD. ED note does not indicate any specific finding. Istat troponin was negative x 2, labs otherwise showed K 3.6, glucose 122, Cr 0.70, normal CBC. EKG showed NSR with nonspecific ST-T Changes, short PR c/w prior. She has since stopped the amlodipine and continued to feel better daily. She denies any chest pain or dyspnea - states she is tremendously active walking everywhere because she does not have a car, and has not had any exertional angina. She has noticed her BP has been running normally off antihypertensives.   Past Medical History:  Diagnosis Date  . Glaucoma   . Hypercholesteremia   .  Hypertension     Past Surgical History:  Procedure Laterality Date  . ABDOMINAL HYSTERECTOMY    . BREAST LUMPECTOMY Bilateral     Current Medications: Current Meds  Medication Sig  . LUMIGAN 0.01 % SOLN Place 1 drop into both eyes at bedtime.     Allergies:   Penicillins and Ciprofloxacin   Social History   Social History  . Marital status: Single    Spouse name: N/A  . Number of children: N/A  . Years of education: N/A   Social History Main Topics  . Smoking status: Never Smoker  . Smokeless tobacco: Never Used  . Alcohol use No  . Drug use: No  . Sexual activity: Not Asked   Other Topics Concern  . None   Social History Narrative  . None     Family History:  Family History  Problem Relation Age of Onset  . Hypertension Mother     ROS:   Please see the history of present illness.  All other systems are reviewed and otherwise negative.    PHYSICAL EXAM:   VS:  BP 126/78   Pulse 68   Ht 5\' 3"  (1.6 m)   Wt 124 lb (56.2 kg)   BMI 21.97 kg/m   BMI: Body mass index is 21.97 kg/m. GEN: Well nourished, well developed AAF, in no acute distress  HEENT: normocephalic, atraumatic Neck: no JVD, carotid bruits, or masses Cardiac: RRR; no murmurs, rubs, or gallops, no edema  Respiratory:  clear to auscultation bilaterally, normal work of breathing GI:  soft, nontender, nondistended, + BS MS: no deformity or atrophy  Skin: warm and dry, no rash Neuro:  Alert and Oriented x 3, Strength and sensation are intact, follows commands Psych: euthymic mood, full affect  Wt Readings from Last 3 Encounters:  12/23/16 124 lb (56.2 kg)  12/18/16 125 lb (56.7 kg)  12/04/16 125 lb (56.7 kg)      Studies/Labs Reviewed:   EKG:   EKG was not ordered today. Reviewed EKG from ED.  Recent Labs: 12/18/2016: BUN 8; Creatinine, Ser 0.70; Hemoglobin 14.7; Platelets 266; Potassium 3.6; Sodium 140   Lipid Panel No results found for: CHOL, TRIG, HDL, CHOLHDL, VLDL, LDLCALC,  LDLDIRECT  Additional studies/ records that were reviewed today include: Summarized above    ASSESSMENT & PLAN:   1. Palpitations - the patient relays sensation of heart pounding harder as well as lower extremity edema while on amlodipine. I question whether this was dropping her BP too low and she was having a compensatory vasodilation response, since her blood pressure is now completely normal off any antihypertensives. The LEE is a fairly common side effect. She denies any tachypalpitations. EKG did not show any significant arrhythmia. She feels much better off amlodipine and BP is normal without it. We discussed event monitoring but she prefers to hold off and observe for any recurrent symptoms which is reasonable. She does have a short PR interval on her EKG but this was seen on prior tracings as well. I discussed importance of observation for recurrent sx - she will notify us ASAP if she experiences any. Will check screening TSH to complete workup. 2. Essential HTN - now controlled off anti-hypertensives. Follow. 3. Hyperlipidemia - being managed by PCP. Patient did not tolerate simvastatin due to perceived increased urination. 4. Hyperglycemia - discussed finding with pt, encouraged her to f/u PCP for further monitoring.  Disposition: F/u with Dr. [name] 2-3 months.  Medication Adjustments/Labs and Tests Ordered: Current medicines are reviewed at length with the patient today.  Concerns regarding medicines are outlined above. Medication changes, Labs and Tests ordered today are summarized above and listed in the Patient Instructions accessible in Encounters. Upon closing encounter and adding amlodipine to intolerances, it was noted that intake team had not removed amlodipine from medication list. I've asked intake nurse to call patient to make sure she knows NOT to continue this medication. I have since removed from her med list. We had discussed this in depth at OV but I just wanted to make  sure since her discharge document still had this listed.  Signed, Charlie Pitter, PA-C  12/23/2016 8:52 AM    Victoria Cardenas, [place], [place]  [address] Phone: [phone]; Fax: [phone]

## 2016-12-23 ENCOUNTER — Ambulatory Visit (INDEPENDENT_AMBULATORY_CARE_PROVIDER_SITE_OTHER): Payer: Medicare Other | Admitting: Physician Assistant

## 2016-12-23 ENCOUNTER — Encounter: Payer: Self-pay | Admitting: Physician Assistant

## 2016-12-23 VITALS — BP 126/78 | HR 68 | Ht 63.0 in | Wt 124.0 lb

## 2016-12-23 DIAGNOSIS — R002 Palpitations: Secondary | ICD-10-CM

## 2016-12-23 DIAGNOSIS — E785 Hyperlipidemia, unspecified: Secondary | ICD-10-CM

## 2016-12-23 DIAGNOSIS — I10 Essential (primary) hypertension: Secondary | ICD-10-CM | POA: Insufficient documentation

## 2016-12-23 DIAGNOSIS — R739 Hyperglycemia, unspecified: Secondary | ICD-10-CM | POA: Diagnosis not present

## 2016-12-23 LAB — TSH: TSH: 0.198 u[IU]/mL — ABNORMAL LOW (ref 0.450–4.500)

## 2016-12-23 NOTE — Patient Instructions (Addendum)
Medication Instructions:  Your physician recommends that you continue on your current medications as directed. Please refer to the Current Medication list given to you today.   Labwork: TODAY:  TSH  Testing/Procedures:   Follow-Up: Your physician recommends that you schedule a follow-up appointment in: 2-3 [place]   Any Other Special Instructions Will Be Listed Below (If Applicable).     If you need a refill on your cardiac medications before your next appointment, please call your pharmacy.

## 2017-01-05 ENCOUNTER — Ambulatory Visit
Admission: RE | Admit: 2017-01-05 | Discharge: 2017-01-05 | Disposition: A | Discharge status: Home / Self Care | Payer: Medicare Other | Source: Ambulatory Visit | Attending: Internal Medicine | Admitting: Internal Medicine

## 2017-01-05 DIAGNOSIS — Z1231 Encounter for screening mammogram for malignant neoplasm of breast: Secondary | ICD-10-CM

## 2017-04-01 ENCOUNTER — Ambulatory Visit: Payer: Medicare Other | Admitting: Cardiology

## 2017-04-02 ENCOUNTER — Encounter: Payer: Self-pay | Admitting: Cardiology

## 2017-04-02 ENCOUNTER — Ambulatory Visit (INDEPENDENT_AMBULATORY_CARE_PROVIDER_SITE_OTHER): Payer: Medicare Other | Admitting: Cardiology

## 2017-04-02 ENCOUNTER — Encounter (INDEPENDENT_AMBULATORY_CARE_PROVIDER_SITE_OTHER): Payer: Self-pay

## 2017-04-02 VITALS — BP 132/82 | HR 57 | Ht 63.0 in | Wt 122.0 lb

## 2017-04-02 DIAGNOSIS — R7989 Other specified abnormal findings of blood chemistry: Secondary | ICD-10-CM | POA: Diagnosis not present

## 2017-04-02 DIAGNOSIS — R002 Palpitations: Secondary | ICD-10-CM | POA: Diagnosis not present

## 2017-04-02 DIAGNOSIS — I119 Hypertensive heart disease without heart failure: Secondary | ICD-10-CM

## 2017-04-02 LAB — TSH: TSH: 0.356 u[IU]/mL — ABNORMAL LOW (ref 0.450–4.500)

## 2017-04-02 LAB — T4, FREE: Free T4: 1.24 ng/dL (ref 0.82–1.77)

## 2017-04-02 LAB — T3, FREE: T3, Free: 3.4 pg/mL (ref 2.0–4.4)

## 2017-04-02 MED ORDER — ASPIRIN EC 81 MG PO TBEC: 81.0000 mg | DELAYED_RELEASE_TABLET | Freq: Every day | ORAL | 3 refills | Status: DC

## 2017-04-02 NOTE — Progress Notes (Signed)
Cardiology Office Note    Date:  04/02/2017  ID:  Victoria Cardenas, DOB 09-Nov-1950, MRN [id number] PCP:  Nolene Ebbs, MD  Cardiologist:  [name]   Chief Complaint: f/u palpitations, hypertension  History of Present Illness:  Victoria Cardenas is a 66 y.o. female with history of HTN, hyperlipidemia, glaucoma, mild MR who presents to f/u palpitations. She's previously followed with Dr. [name] for history of abnormal stress test. Reportedly while in [place] in the past she underwent an exercise treadmill stress test that was positive for 1 mm ST depressions however follow-up exercise nuclear stress test showed no ischemia and no prior scar. There is a report scanned in from 2013 indicating normal imaging, LVEF >65%, "during treadmill exercise and Adenosine infusion, the patient developed dyspnea, rare PVCs and positive ST segment response without chest pain." It sounds like this was managed conservatively. 2D echo 2016 showed EF 60-65%, mild aortic sclerosis, mild MR.   12/23/2016 - She recently was switched from valsartan onto amlodipine '10mg'$  daily due to recall. After that switch (approx 1 week ago), she began noticing increased LEE as well as sensation of heart pounding (not racing, just pounding harder). She was also feeling intermittently dizzy. She tried to go to her PCP's office but he was not in so she went on to the ED. CXR NAD. ED note does not indicate any specific finding. Istat troponin was negative x 2, labs otherwise showed K 3.6, glucose 122, Cr 0.70, normal CBC. EKG showed NSR with nonspecific ST-T Changes, short PR c/w prior. She has since stopped the amlodipine and continued to feel better daily. She denies any chest pain or dyspnea - states she is tremendously active walking everywhere because she does not have a car, and has not had any exertional angina. She has noticed her BP has been running normally off antihypertensives.  04/02/2017 - the patient is coming after 4 months, she states  that her palpitations have completely resolved after she discontinued amlodipine. She is currently on no blood pressure medication and her blood pressure is always in 120 to 130 range. She denies any chest pain or shortness of breath, she is very active she doesn't have a car she has to walk to the bus stop and never experiences any symptoms. She previously had elevated lipids however tried diet and a normalized, followed by primary care physician. She has recently lost few pounds despite feeling hungry all the time, her TSH was normal in August slightly low. She previously had dedicated thyroid ultrasound for nodules one of them biopsied and benign. No other nodules met criteria for follow-up scan.   Past Medical History:  Diagnosis Date  . Glaucoma   . Hypercholesteremia   . Hypertension     Past Surgical History:  Procedure Laterality Date  . ABDOMINAL HYSTERECTOMY    . BREAST EXCISIONAL BIOPSY Bilateral   . BREAST LUMPECTOMY Bilateral     Current Medications: Current Meds  Medication Sig  . alendronate (FOSAMAX) 70 MG tablet Take 70 mg by mouth once a week.  Marland Kitchen LUMIGAN 0.01 % SOLN Place 1 drop into both eyes at bedtime.     Allergies:   Amlodipine; Penicillins; Ciprofloxacin; and Simvastatin   Social History   Socioeconomic History  . Marital status: Single    Spouse name: None  . Number of children: None  . Years of education: None  . Highest education level: None  Social Needs  . Financial resource strain: None  . Food insecurity -  worry: None  . Food insecurity - inability: None  . Transportation needs - medical: None  . Transportation needs - non-medical: None  Occupational History  . None  Tobacco Use  . Smoking status: Never Smoker  . Smokeless tobacco: Never Used  Substance and Sexual Activity  . Alcohol use: No  . Drug use: No  . Sexual activity: None  Other Topics Concern  . None  Social History Narrative  . None     Family History:  Family History    Problem Relation Age of Onset  . Hypertension Mother     ROS:   Please see the history of present illness.  All other systems are reviewed and otherwise negative.    PHYSICAL EXAM:   VS:  BP 132/82   Pulse (!) 57   Ht '5\' 3"'$  (1.6 m)   Wt 122 lb (55.3 kg)   SpO2 98%   BMI 21.61 kg/m   BMI: Body mass index is 21.61 kg/m. GEN: Well nourished, well developed AAF, in no acute distress  HEENT: normocephalic, atraumatic, right thyroid nodule Neck: no JVD, carotid bruits, or masses Cardiac: RRR; no murmurs, rubs, or gallops, no edema  Respiratory:  clear to auscultation bilaterally, normal work of breathing GI: soft, nontender, nondistended, + BS MS: no deformity or atrophy  Skin: warm and dry, no rash Neuro:  Alert and Oriented x 3, Strength and sensation are intact, follows commands Psych: euthymic mood, full affect  Wt Readings from Last 3 Encounters:  04/02/17 122 lb (55.3 kg)  12/23/16 124 lb (56.2 kg)  12/18/16 125 lb (56.7 kg)    Studies/Labs Reviewed:   EKG:   EKG was not ordered today. Reviewed EKG from ED.  Recent Labs: 12/18/2016: BUN 8; Creatinine, Ser 0.70; Hemoglobin 14.7; Platelets 266; Potassium 3.6; Sodium 140 12/23/2016: TSH 0.198   Lipid Panel No results found for: CHOL, TRIG, HDL, CHOLHDL, VLDL, LDLCALC, LDLDIRECT  Additional studies/ records that were reviewed today include: Summarized above    ASSESSMENT & PLAN:   1. Increased appetite and weight loss, low TSH in August, we will repeat with free T3 and free T4 2. Palpitations - this has resolved with discontinuation of amlodipine 3. Essential HTN - now controlled off anti-hypertensives with diet only. 4. Hyperlipidemia - being managed by PCP. It has normalized with diet, we will obtain records from primary care physician.   Disposition: F/u with Dr. [name] 6 months.  Medication Adjustments/Labs and Tests Ordered: Current medicines are reviewed at length with the patient today.  Concerns  regarding medicines are outlined above. Medication changes, Labs and Tests ordered today are summarized above and listed in the Patient Instructions accessible in Encounters. Upon closing encounter and adding amlodipine to intolerances, it was noted that intake team had not removed amlodipine from medication list. I've asked intake nurse to call patient to make sure she knows NOT to continue this medication. I have since removed from her med list. We had discussed this in depth at OV but I just wanted to make sure since her discharge document still had this listed.  Signed, Ena Dawley, MD  04/02/2017 9:11 AM    [facility] [place], [place], [place]  [address] Phone: [phone]; Fax: [phone]

## 2017-04-02 NOTE — Patient Instructions (Signed)
Medication Instructions:   START TAKING ASPIRIN 81 MG ONCE DAILY    Labwork:  TODAY---TSH, FREE T3, AND FREE T4     Follow-Up:  Your physician wants you to follow-up in: [place] will receive a reminder letter in the mail two months in advance. If you don't receive a letter, please call our office to schedule the follow-up appointment.        If you need a refill on your cardiac medications before your next appointment, please call your pharmacy.

## 2017-04-14 IMAGING — US US THYROID BIOPSY
1 series · 13 of 15 positions shown · non-contrast
Comparison: Ultrasound on November 13, 2015

INDICATION: Dysphagia with findings of a right thyroid nodule on recent
ultrasound. Request is made for biopsy.

EXAM:
ULTRASOUND GUIDED NEEDLE ASPIRATE BIOPSY OF THE THYROID GLAND

[Series 1: us thyroid biopsy · 0.07mm/px · 15 acquisitions, 13 frames shown]
[im 1/15]
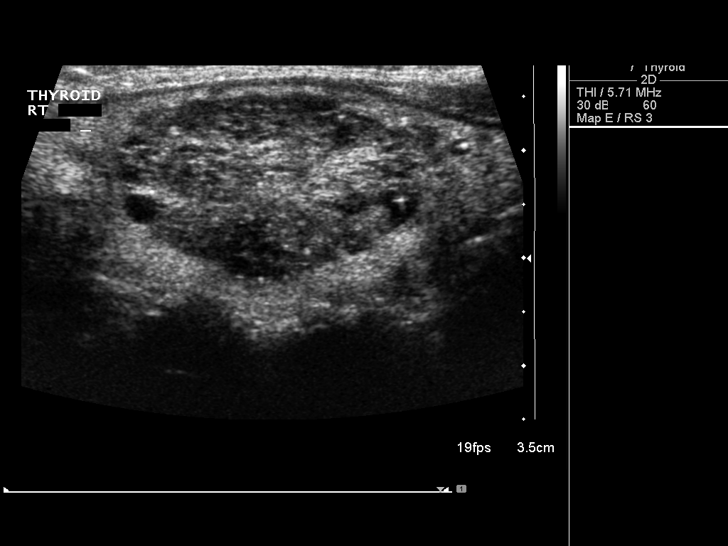
[im 2/15]
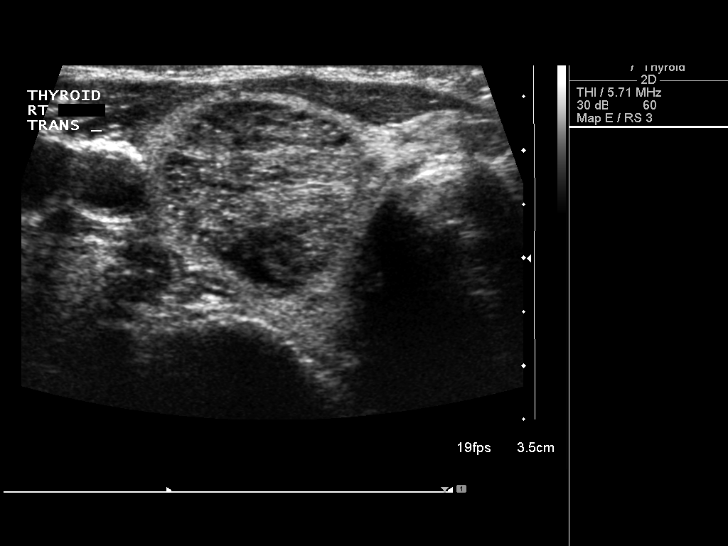
[im 3/15]
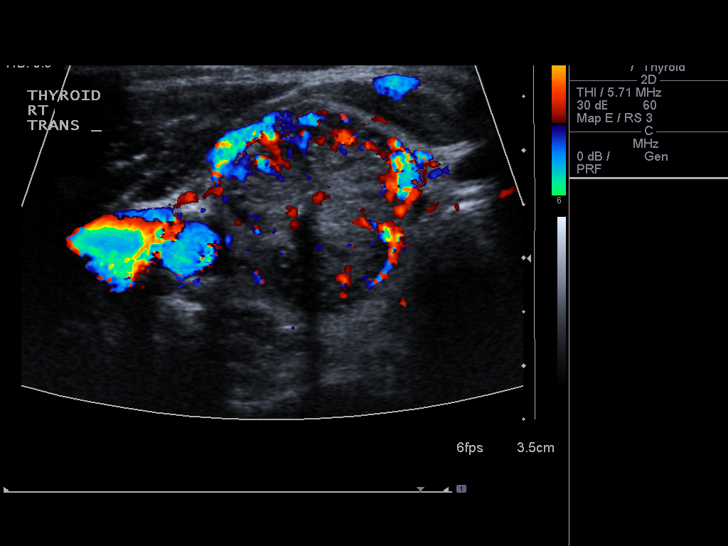
[im 5/15]
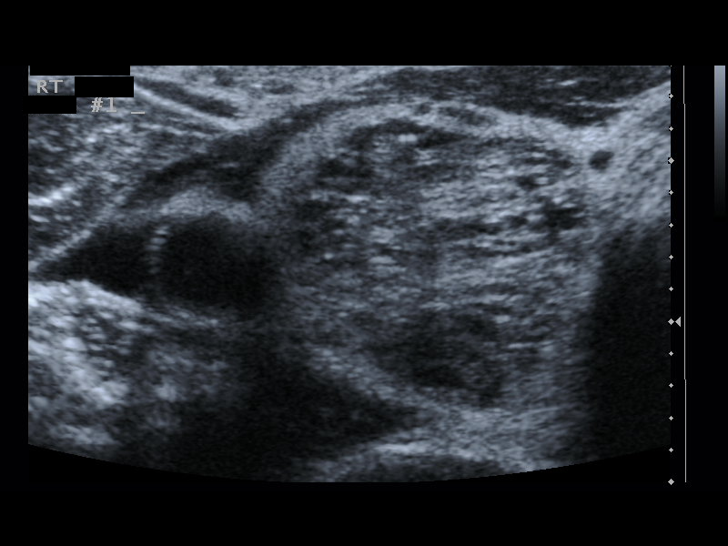
[im 6/15]
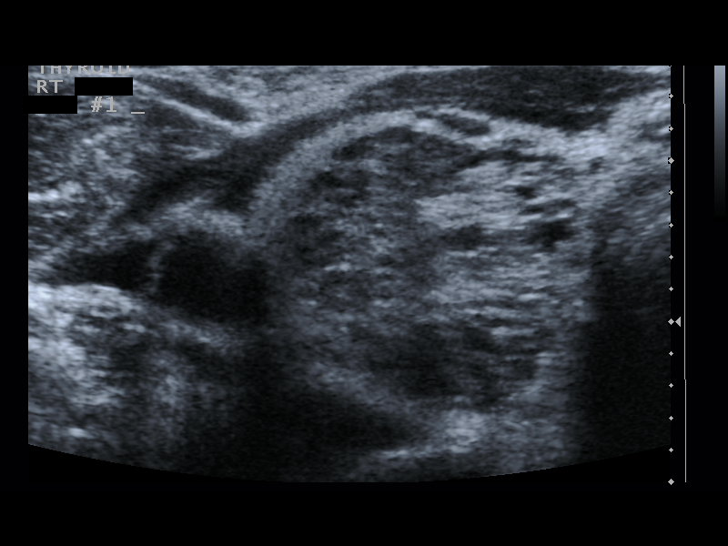
[im 7/15]
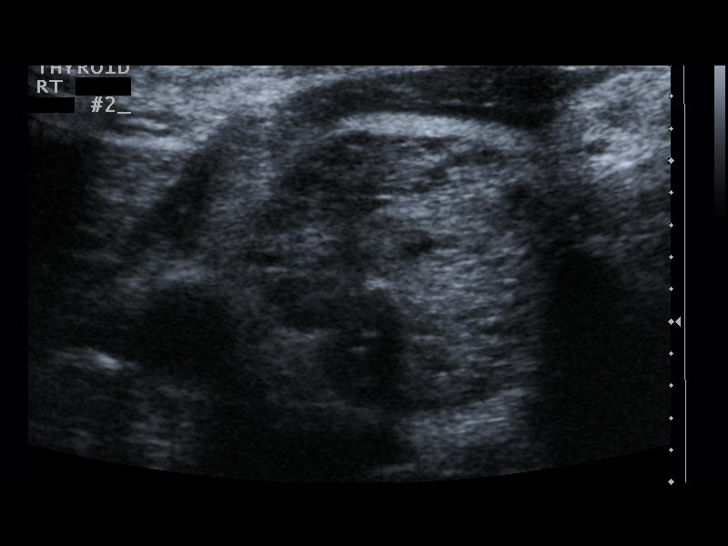
[im 8/15]
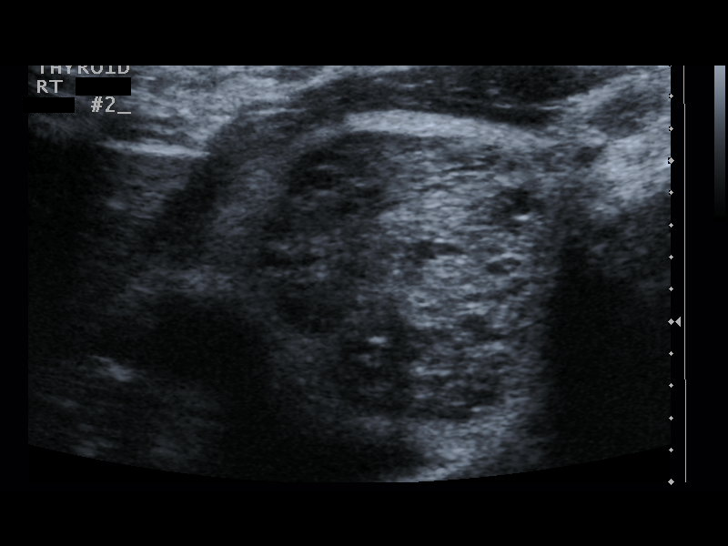
[im 9/15]
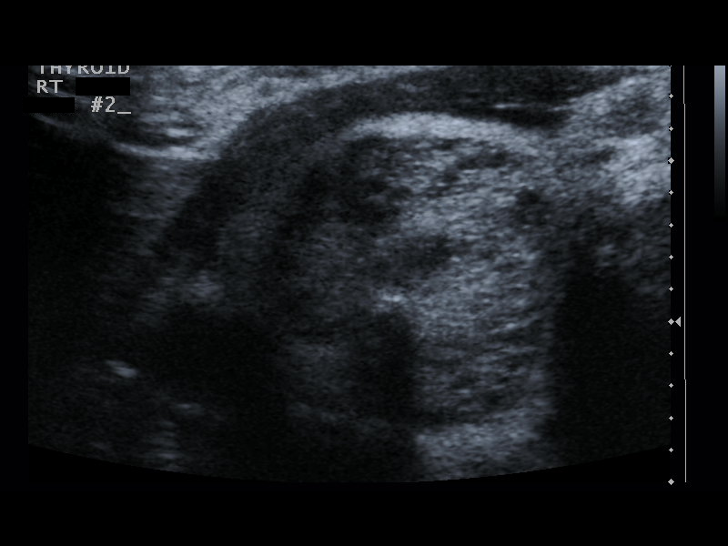
[im 10/15]
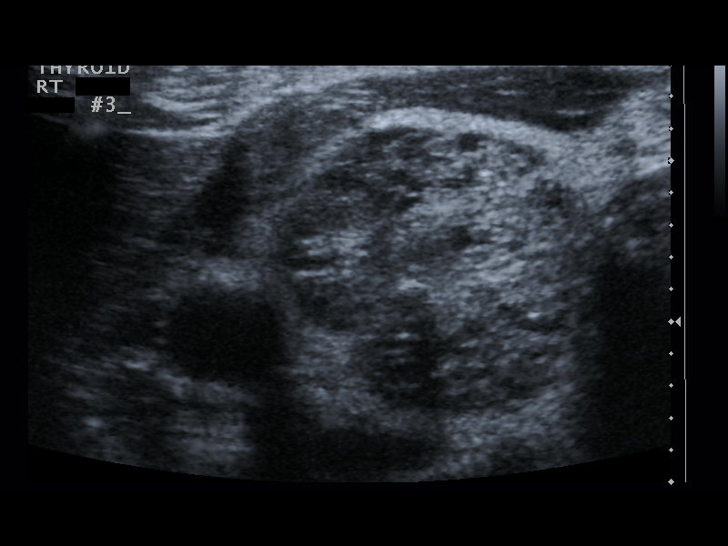
[im 11/15]
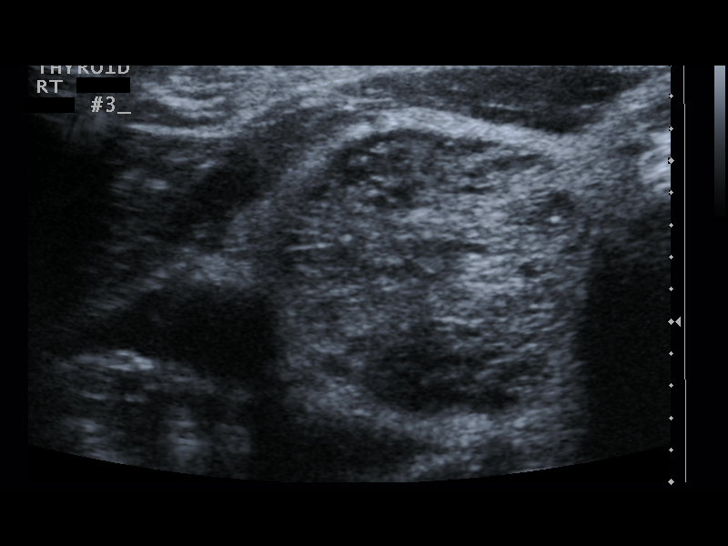
[im 13/15]
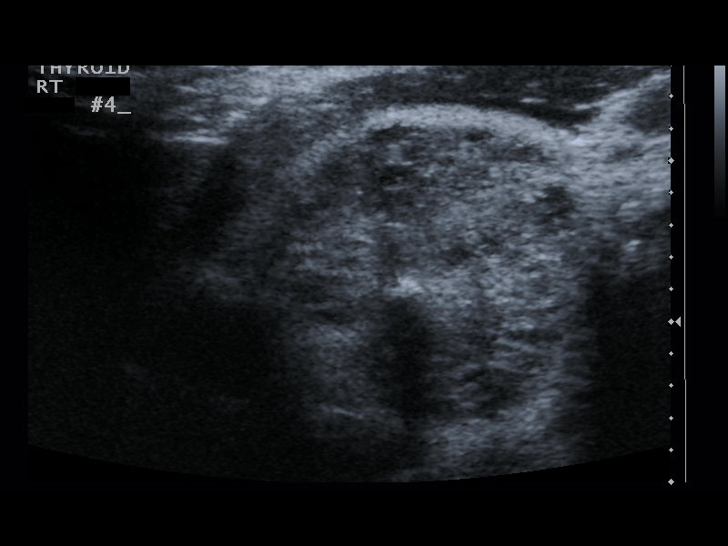
[im 14/15]
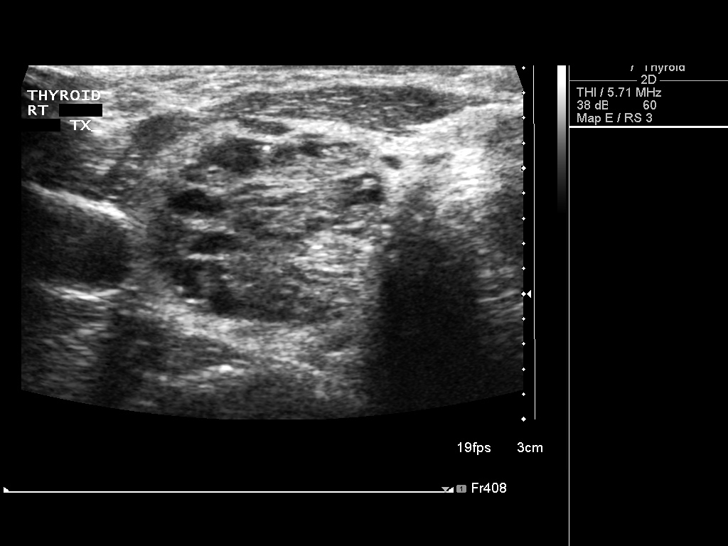
[im 15/15]
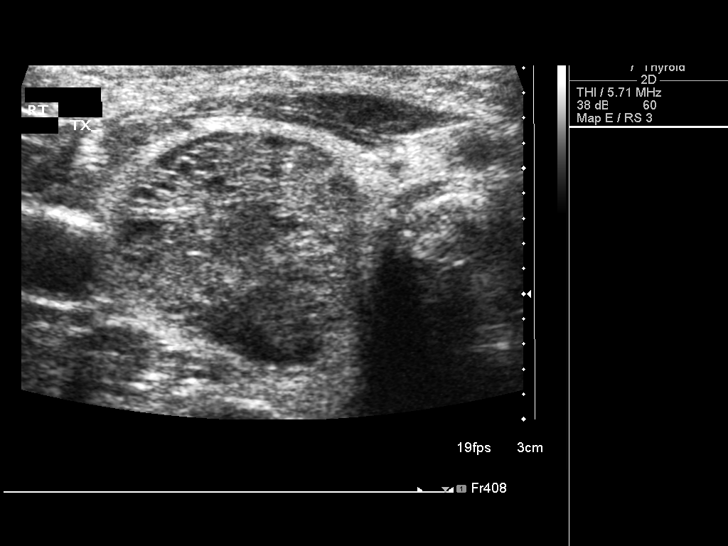

[13 of 15 positions shown; findings below may reference images not displayed]

MEDICATIONS:
1% lidocaine

COMPLICATIONS:
None immediate.

PROCEDURE:
Informed written consent was obtained from the patient after a
thorough discussion of the procedural risks, benefits and
alternatives. All questions were addressed. Maximal Sterile Barrier
Technique was utilized including caps, mask, sterile gowns, sterile
gloves, sterile drape, hand hygiene and skin antiseptic. A timeout
was performed prior to the initiation of the procedure.

Ultrasound was performed to localize and mark an adequate site for
the biopsy. The patient was then prepped and draped in a normal
sterile fashion. Local anesthesia was provided with 1% lidocaine.
Using direct ultrasound guidance, 4 passes were made using 25 gauge
needles into the nodule within the right mid lobe of the thyroid.
Ultrasound was used to confirm needle placements on all occasions.
Specimens were sent to Pathology for analysis.
IMPRESSION: Ultrasound guided needle aspirate biopsy performed of the right mid
thyroid nodule.

## 2017-04-24 ENCOUNTER — Other Ambulatory Visit: Payer: Self-pay | Admitting: Surgery

## 2017-04-24 DIAGNOSIS — R2232 Localized swelling, mass and lump, left upper limb: Secondary | ICD-10-CM

## 2017-04-24 DIAGNOSIS — N6310 Unspecified lump in the right breast, unspecified quadrant: Secondary | ICD-10-CM

## 2017-05-04 ENCOUNTER — Ambulatory Visit
Admission: RE | Admit: 2017-05-04 | Discharge: 2017-05-04 | Disposition: A | Discharge status: Home / Self Care | Payer: Medicare Other | Source: Ambulatory Visit | Attending: Surgery | Admitting: Surgery

## 2017-05-04 ENCOUNTER — Ambulatory Visit: Payer: Medicare Other

## 2017-05-04 ENCOUNTER — Other Ambulatory Visit: Payer: Self-pay | Admitting: Surgery

## 2017-05-04 DIAGNOSIS — R2232 Localized swelling, mass and lump, left upper limb: Secondary | ICD-10-CM

## 2017-05-04 DIAGNOSIS — N6310 Unspecified lump in the right breast, unspecified quadrant: Secondary | ICD-10-CM

## 2017-05-04 HISTORY — DX: Malignant neoplasm of unspecified site of unspecified female breast: C50.919

## 2017-10-26 ENCOUNTER — Encounter: Payer: Self-pay | Admitting: Cardiology

## 2017-11-09 ENCOUNTER — Encounter (INDEPENDENT_AMBULATORY_CARE_PROVIDER_SITE_OTHER): Payer: Self-pay

## 2017-11-09 ENCOUNTER — Ambulatory Visit (INDEPENDENT_AMBULATORY_CARE_PROVIDER_SITE_OTHER): Payer: Medicare Other | Admitting: Cardiology

## 2017-11-09 ENCOUNTER — Encounter: Payer: Self-pay | Admitting: Cardiology

## 2017-11-09 VITALS — BP 120/100 | HR 58 | Ht 63.0 in | Wt 127.4 lb

## 2017-11-09 DIAGNOSIS — E785 Hyperlipidemia, unspecified: Secondary | ICD-10-CM | POA: Diagnosis not present

## 2017-11-09 DIAGNOSIS — R002 Palpitations: Secondary | ICD-10-CM

## 2017-11-09 DIAGNOSIS — I10 Essential (primary) hypertension: Secondary | ICD-10-CM

## 2017-11-09 NOTE — Patient Instructions (Addendum)
Medication Instructions:    STOP TAKING ASPIRIN NOW   Labwork: None  Testing/Procedures: None  Follow-Up: Your physician wants you to follow-up in:  1 Year with Dr. [name]. You will receive a reminder letter in the mail two months in advance. If you don't receive a letter, please call our office to schedule the follow-up appointment.  If you need a refill on your cardiac medications before your next appointment, please call your pharmacy.

## 2017-11-09 NOTE — Progress Notes (Signed)
Cardiology Office Note    Date:  11/09/2017  ID:  [name], DOB [birth year], MRN [id number] PCP:  Nolene Ebbs, MD  Cardiologist:  [name]   Chief Complaint: f/u palpitations, hypertension  History of Present Illness:  Victoria Cardenas is a 67 y.o. female with history of HTN, hyperlipidemia, glaucoma, mild MR who presents to f/u palpitations. She's previously followed with Dr. [name] for history of abnormal stress test. Reportedly while in [place] in the past she underwent an exercise treadmill stress test that was positive for 1 mm ST depressions however follow-up exercise nuclear stress test showed no ischemia and no prior scar. There is a report scanned in from 2013 indicating normal imaging, LVEF >65%, "during treadmill exercise and Adenosine infusion, the patient developed dyspnea, rare PVCs and positive ST segment response without chest pain." It sounds like this was managed conservatively. 2D echo 2016 showed EF 60-65%, mild aortic sclerosis, mild MR.   12/23/2016 - She recently was switched from valsartan onto amlodipine 62m daily due to recall. After that switch (approx 1 week ago), she began noticing increased LEE as well as sensation of heart pounding (not racing, just pounding harder). She was also feeling intermittently dizzy. She tried to go to her PCP's office but he was not in so she went on to the ED. CXR NAD. ED note does not indicate any specific finding. Istat troponin was negative x 2, labs otherwise showed K 3.6, glucose 122, Cr 0.70, normal CBC. EKG showed NSR with nonspecific ST-T Changes, short PR c/w prior. She has since stopped the amlodipine and continued to feel better daily. She denies any chest pain or dyspnea - states she is tremendously active walking everywhere because she does not have a car, and has not had any exertional angina. She has noticed her BP has been running normally off antihypertensives.  04/02/2017 - the patient is coming after 4 months, she states  that her palpitations have completely resolved after she discontinued amlodipine. She is currently on no blood pressure medication and her blood pressure is always in 120 to 130 range. She denies any chest pain or shortness of breath, she is very active she doesn't have a car she has to walk to the bus stop and never experiences any symptoms. She previously had elevated lipids however tried diet and a normalized, followed by primary care physician. She has recently lost few pounds despite feeling hungry all the time, her TSH was normal in August slightly low. She previously had dedicated thyroid ultrasound for nodules one of them biopsied and benign. No other nodules met criteria for follow-up scan.  11/09/2017, this is 6 months follow-up, she has been doing quite, no palpitation dizziness or syncope.  Her blood pressures controlled just by diet, she gets occasional lower extremity edema when she eats salty food otherwise no edema no orthopnea proximal nocturnal dyspnea.  She has been experiencing epigastric pain is undergoing EGD next week.  She stopped using aspirin because of that.  Past Medical History:  Diagnosis Date  . [place] 2005   right breast  . Glaucoma   . Hypercholesteremia   . Hypertension     Past Surgical History:  Procedure Laterality Date  . ABDOMINAL HYSTERECTOMY    . BREAST EXCISIONAL BIOPSY Left pt unsure   . 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 alendronate (FOSAMAX) 70 MG tablet Take 70 mg by mouth once a  week.  . aspirin EC 81 MG tablet Take 1 tablet (81 mg total) by mouth daily.     Allergies:   Amlodipine; Penicillins; Ciprofloxacin; and Simvastatin   Social History   Socioeconomic History  . Marital status: Single    Spouse name: Not on file  . Number of children: Not on file  . Years of education: Not on file  . Highest education level: Not on file  Occupational History  . Not on file    Social Needs  . Financial resource strain: Not on file  . Food insecurity:    Worry: Not on file    Inability: Not on file  . Transportation needs:    Medical: Not on file    Non-medical: Not on file  Tobacco Use  . Smoking status: Never Smoker  . Smokeless tobacco: Never Used  Substance and Sexual Activity  . Alcohol use: No  . Drug use: No  . Sexual activity: Not on file  Lifestyle  . Physical activity:    Days per week: Not on file    Minutes per session: Not on file  . Stress: Not on file  Relationships  . Social connections:    Talks on phone: Not on file    Gets together: Not on file    Attends religious service: Not on file    Active member of club or organization: Not on file    Attends meetings of clubs or organizations: Not on file    Relationship status: Not on file  Other Topics Concern  . Not on file  Social History Narrative  . Not on file     Family History:  Family History  Problem Relation Age of Onset  . Hypertension Mother   . Breast cancer Mother 24  . Breast cancer Maternal Aunt   . Breast cancer Maternal Grandmother     ROS:   Please see the history of present illness.  All other systems are reviewed and otherwise negative.    PHYSICAL EXAM:   VS:  BP (!) 120/100   Pulse (!) 58   Ht _0  (1.6 m)   Wt 127 lb 6.4 oz (57.8 kg)   SpO2 99%   BMI 22.57 kg/m   BMI: Body mass index is 22.57 kg/m. GEN: Well nourished, well developed AAF, in no acute distress  HEENT: normocephalic, atraumatic, right thyroid nodule Neck: no JVD, carotid bruits, or masses Cardiac: RRR; no murmurs, rubs, or gallops, no edema  Respiratory:  clear to auscultation bilaterally, normal work of breathing GI: soft, nontender, nondistended, + BS MS: no deformity or atrophy  Skin: warm and dry, no rash Neuro:  Alert and Oriented x 3, Strength and sensation are intact, follows commands Psych: euthymic mood, full affect  Wt Readings from Last 3 Encounters:   11/09/17 127 lb 6.4 oz (57.8 kg)  04/02/17 122 lb (55.3 kg)  12/23/16 124 lb (56.2 kg)    Studies/Labs Reviewed:   EKG:   EKG performed today November 09, 2017 shows sinus bradycardia, 57 bpm, otherwise normal EKG, this was personally reviewed.  Recent Labs: 12/18/2016: BUN 8; Creatinine, Ser 0.70; Hemoglobin 14.7; Platelets 266; Potassium 3.6; Sodium 140 04/02/2017: TSH 0.356   Lipid Panel No results found for: CHOL, TRIG, HDL, CHOLHDL, VLDL, LDLCALC, LDLDIRECT  Additional studies/ records that were reviewed today include: Summarized above    ASSESSMENT & PLAN:   1. Palpitations - this has resolved with discontinuation of amlodipine 2. Essential HTN - now controlled  off anti-hypertensives with diet only. 3. Hyperlipidemia - being managed by PCP. It has normalized with diet, we will obtain records from primary care physician.  4. Abnormal TSH at 0.3 however normal free T4 and free T3.  Disposition: F/u with Dr. [name] in 1 year.  Medication Adjustments/Labs and Tests Ordered: Current medicines are reviewed at length with the patient today.  Concerns regarding medicines are outlined above. Medication changes, Labs and Tests ordered today are summarized above and listed in the Patient Instructions accessible in Encounters. Upon closing encounter and adding amlodipine to intolerances, it was noted that intake team had not removed amlodipine from medication list. I've asked intake nurse to call patient to make sure she knows NOT to continue this medication. I have since removed from her med list. We had discussed this in depth at [facility] but I just wanted to make sure since her discharge document still had this listed.  Signed, Ena Dawley, MD  11/09/2017 4:18 PM    [facility] [address], [place], [place]  [address] Phone: [phone]; Fax: [phone]

## 2017-11-11 ENCOUNTER — Other Ambulatory Visit: Payer: Self-pay | Admitting: Internal Medicine

## 2017-11-11 DIAGNOSIS — E2839 Other primary ovarian failure: Secondary | ICD-10-CM

## 2017-12-22 ENCOUNTER — Ambulatory Visit
Admission: RE | Admit: 2017-12-22 | Discharge: 2017-12-22 | Disposition: A | Discharge status: Home / Self Care | Payer: Medicare Other | Source: Ambulatory Visit | Attending: Internal Medicine | Admitting: Internal Medicine

## 2017-12-22 DIAGNOSIS — E2839 Other primary ovarian failure: Secondary | ICD-10-CM

## 2018-04-05 ENCOUNTER — Other Ambulatory Visit: Payer: Self-pay | Admitting: Internal Medicine

## 2018-04-05 DIAGNOSIS — Z1231 Encounter for screening mammogram for malignant neoplasm of breast: Secondary | ICD-10-CM

## 2018-05-14 ENCOUNTER — Ambulatory Visit
Admission: RE | Admit: 2018-05-14 | Discharge: 2018-05-14 | Disposition: A | Discharge status: Home / Self Care | Payer: Medicare Other | Source: Ambulatory Visit | Attending: Internal Medicine | Admitting: Internal Medicine

## 2018-05-14 DIAGNOSIS — Z1231 Encounter for screening mammogram for malignant neoplasm of breast: Secondary | ICD-10-CM

## 2018-05-29 ENCOUNTER — Ambulatory Visit (INDEPENDENT_AMBULATORY_CARE_PROVIDER_SITE_OTHER)
Admission: EM | Admit: 2018-05-29 | Discharge: 2018-05-29 | Disposition: A | Discharge status: Home / Self Care | Payer: Medicare Other | Source: Home / Self Care

## 2018-05-29 ENCOUNTER — Other Ambulatory Visit: Payer: Self-pay

## 2018-05-29 ENCOUNTER — Emergency Department (HOSPITAL_COMMUNITY): Payer: Medicare Other

## 2018-05-29 ENCOUNTER — Encounter (HOSPITAL_COMMUNITY): Payer: Self-pay

## 2018-05-29 ENCOUNTER — Emergency Department (HOSPITAL_COMMUNITY)
Admission: EM | Admit: 2018-05-29 | Discharge: 2018-05-29 | Disposition: A | Discharge status: Home / Self Care | Payer: Medicare Other | Attending: Emergency Medicine | Admitting: Emergency Medicine

## 2018-05-29 DIAGNOSIS — R42 Dizziness and giddiness: Secondary | ICD-10-CM | POA: Diagnosis not present

## 2018-05-29 DIAGNOSIS — I10 Essential (primary) hypertension: Secondary | ICD-10-CM | POA: Diagnosis not present

## 2018-05-29 DIAGNOSIS — M436 Torticollis: Secondary | ICD-10-CM | POA: Diagnosis not present

## 2018-05-29 DIAGNOSIS — M542 Cervicalgia: Secondary | ICD-10-CM | POA: Diagnosis present

## 2018-05-29 MED ORDER — DICLOFENAC SODIUM 1 % TD GEL: 2.0000 g | Freq: Four times a day (QID) | TRANSDERMAL | 0 refills | Status: DC
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
## 2018-05-29 NOTE — ED Notes (Signed)
The provider( Dr Mannie Stabile ) has advise the PT to go the ER. After looking at the EKG. Per Kenton Kingfisher

## 2018-05-29 NOTE — Discharge Instructions (Signed)
Apply Voltaren to area as prescribed.  Follow up with your doctor. Return to ER as needed.

## 2018-05-29 NOTE — ED Triage Notes (Signed)
Pt states she has a tingle and stiffness in her left shoulder and neck pt states its radiating down in her left hand. Pt states it started about eight a.m this morning. Pt states she feels light headed as well.

## 2018-05-29 NOTE — ED Notes (Signed)
Patient transported to X-ray 

## 2018-05-29 NOTE — ED Notes (Signed)
Pt alert and oriented in NAD. Pt verbalized understanding of discharge instructions. 

## 2018-05-29 NOTE — ED Triage Notes (Signed)
Pt had a stiff neck and left shoulder beginning last night. Today she felt tingling in left arm and hand with some dizziness.

## 2018-08-03 ENCOUNTER — Telehealth: Payer: Self-pay | Admitting: Cardiology

## 2018-08-03 NOTE — Telephone Encounter (Signed)
Spoke with the pt who is complaining of palpitations, intermittent in nature. Pt reports her current BP today as 136/76 and HR-63.  Pt states its very uncomfortable feeling in her chest.  Pt would like for her appt to be moved up from 4/23 to a sooner date.  Scheduled the pt for Virtual Video Visit through [facility] with Dr [name] on tomorrow 4/8 at 1020.   Pt is aware that she will need to go ahead and take her BP and HR, and weight herself, write this down,  30 mins prior to that appt, for the rooming CMA will call her around 10 am to get those values, do med reconciliation, and update allergies, prior to Dr [name] sending her a text through doximity at 1020.  Pt states she is very familiar with how doximity works, for she just did the same visit with her PCP last week.  Pt knows to click the blue hyperlink when Dr [name] text her to do so, and then click join the meeting. Pt states she is not interested in signing up for mychart at this time on her phone or computer, for she states she has NO computer at all, and prefers not using mychart at all either. Obtained VERBAL CONSENT TO TREAT THE PATIENT OVER THE PHONE.  Went over dotphrase as below, as well.  Pt gave the verbal consent to treat her tomorrow.     Virtual Visit Pre-Appointment Phone Call  Steps For Call:  1. Confirm consent - "In the setting of the current Covid19 crisis, you are scheduled for a ( video) visit with your Dr. [name] on 08/04/18 at 10:20 am.  Just as we do with many in-office visits, in order for you to participate in this visit, we must obtain consent.  If you'd like, I can send this to your mychart (if signed up) or email for you to review.  Otherwise, I can obtain your verbal consent now.  All virtual visits are billed to your insurance company just like a normal visit would be.  By agreeing to a virtual visit, we'd like you to understand that the technology does not allow for your provider to perform an examination, and thus  may limit your provider's ability to fully assess your condition.  Finally, though the technology is pretty good, we cannot assure that it will always work on either your or our end, and in the setting of a video visit, we may have to convert it to a phone-only visit.  In either situation, we cannot ensure that we have a secure connection.  Are you willing to proceed?"  2. Give patient instructions for the use of doximity through her smartphone as below if video visit  3. Advise patient to be prepared with any vital sign or heart rhythm information, their current medicines, and a piece of paper and pen handy for any instructions they may receive the day of their visit  4. Inform patient they will receive a phone call 15 minutes prior to their appointment time (may be from unknown caller ID) so they should be prepared to answer  5. Confirm that appointment type is correct in Epic appointment notes (video visit on 4/8 with Dr. [name] at 10:20 am)    TELEPHONE CALL NOTE  Victoria Cardenas has been deemed a candidate for a follow-up tele-health visit to limit community exposure during the Covid-19 pandemic. I spoke with the patient via phone to ensure availability of phone/video source, confirm preferred email & phone  number, and discuss instructions and expectations.  I reminded Victoria Cardenas to be prepared with any vital sign and/or heart rhythm information that could potentially be obtained via home monitoring, at the time of her visit. I reminded Victoria Cardenas to expect a phone call at the time of her visit if her visit.  Did the patient verbally acknowledge consent to treatment? YES SHE GAVE VERBAL CONSENT TO TREAT OVER THE PHONE TODAY WHILE ARRANGING VIDEO VISIT.  Victoria Alpha, LPN 10/02/5991 5:70 PM    CONSENT FOR TELE-HEALTH VISIT - PLEASE REVIEW  I hereby voluntarily request, consent and authorize [facility] and its employed or contracted physicians, physician assistants,  nurse practitioners or other licensed health care professionals (the Practitioner), to provide me with telemedicine health care services (the Services") as deemed necessary by the treating Practitioner. I acknowledge and consent to receive the Services by the Practitioner via telemedicine. I understand that the telemedicine visit will involve communicating with the Practitioner through live audiovisual communication technology and the disclosure of certain medical information by electronic transmission. I acknowledge that I have been given the opportunity to request an in-person assessment or other available alternative prior to the telemedicine visit and am voluntarily participating in the telemedicine visit.  I understand that I have the right to withhold or withdraw my consent to the use of telemedicine in the course of my care at any time, without affecting my right to future care or treatment, and that the Practitioner or I may terminate the telemedicine visit at any time. I understand that I have the right to inspect all information obtained and/or recorded in the course of the telemedicine visit and may receive copies of available information for a reasonable fee.  I understand that some of the potential risks of receiving the Services via telemedicine include:   Delay or interruption in medical evaluation due to technological equipment failure or disruption;  Information transmitted may not be sufficient (e.g. poor resolution of images) to allow for appropriate medical decision making by the Practitioner; and/or   In rare instances, security protocols could fail, causing a breach of personal health information.  Furthermore, I acknowledge that it is my responsibility to provide information about my medical history, conditions and care that is complete and accurate to the best of my ability. I acknowledge that Practitioner's advice, recommendations, and/or decision may be based on factors not  within their control, such as incomplete or inaccurate data provided by me or distortions of diagnostic images or specimens that may result from electronic transmissions. I understand that the practice of medicine is not an exact science and that Practitioner makes no warranties or guarantees regarding treatment outcomes. I acknowledge that I will receive a copy of this consent concurrently upon execution via email to the email address I last provided but may also request a printed copy by calling the office of [facility].    I understand that my insurance will be billed for this visit.   I have read or had this consent read to me.  I understand the contents of this consent, which adequately explains the benefits and risks of the Services being provided via telemedicine.   I have been provided ample opportunity to ask questions regarding this consent and the Services and have had my questions answered to my satisfaction.  I give my informed consent for the services to be provided through the use of telemedicine in my medical care  By participating in this telemedicine visit I  agree to the above.

## 2018-08-03 NOTE — Telephone Encounter (Signed)
Patient called stating she has been having heart palpations.  She took her BP this morning it was 136/76, her HR 63.    She has appt with Dr. [name] on 4/23, she is wondering if she should wait that long to be seen.

## 2018-08-04 ENCOUNTER — Telehealth: Payer: Self-pay | Admitting: *Deleted

## 2018-08-04 ENCOUNTER — Other Ambulatory Visit: Payer: Self-pay

## 2018-08-04 ENCOUNTER — Telehealth (INDEPENDENT_AMBULATORY_CARE_PROVIDER_SITE_OTHER): Payer: Medicare Other | Admitting: Cardiology

## 2018-08-04 ENCOUNTER — Encounter: Payer: Self-pay | Admitting: Cardiology

## 2018-08-04 DIAGNOSIS — I10 Essential (primary) hypertension: Secondary | ICD-10-CM | POA: Diagnosis not present

## 2018-08-04 DIAGNOSIS — R002 Palpitations: Secondary | ICD-10-CM | POA: Diagnosis not present

## 2018-08-04 DIAGNOSIS — R7989 Other specified abnormal findings of blood chemistry: Secondary | ICD-10-CM

## 2018-08-04 NOTE — Telephone Encounter (Signed)
Called patient to inform her she will be enrolled today for [employer] to mail a 14 day ZIO XT long term holter monitor to her home.  Instructions reviewed.

## 2018-08-04 NOTE — Addendum Note (Signed)
Addended by: [name] on: 08/04/2018 12:24 PM   Modules accepted: Orders

## 2018-08-04 NOTE — Telephone Encounter (Signed)
Virtual Visit Pre-Appointment Phone Call  Steps For Call:  1. Confirm consent - "In the setting of the current Covid19 crisis, you are scheduled for a ( video) visit with Dr. [name] on 11/08/18  at 9:20 am.  Just as we do with many in-office visits, in order for you to participate in this visit, we must obtain consent.  If you'd like, I can send this to your mychart (if signed up) or email for you to review.  Otherwise, I can obtain your verbal consent now.  All virtual visits are billed to your insurance company just like a normal visit would be.  By agreeing to a virtual visit, we'd like you to understand that the technology does not allow for your provider to perform an examination, and thus may limit your provider's ability to fully assess your condition.  Finally, though the technology is pretty good, we cannot assure that it will always work on either your or our end, and in the setting of a video visit, we may have to convert it to a phone-only visit.  In either situation, we cannot ensure that we have a secure connection.  Are you willing to proceed?"  2. Give patient instructions for repeat doximity useon her  smartphone as below if video visit  3. Advise patient to be prepared with any vital sign or heart rhythm information, their current medicines, and a piece of paper and pen handy for any instructions they may receive the day of their visit  4. Inform patient they will receive a phone call 15 minutes prior to their appointment time (may be from unknown caller ID) so they should be prepared to answer  5. Confirm that appointment type is correct in Epic appointment notes (video visit on 7/13 with Dr. [name] at 9:20 am)    TELEPHONE CALL NOTE  Victoria Cardenas has been deemed a candidate for a follow-up tele-health visit to limit community exposure during the Covid-19 pandemic. I spoke with the patient via phone to ensure availability of phone/video source, confirm preferred email &  phone number, and discuss instructions and expectations.  I reminded Victoria Cardenas to be prepared with any vital sign and/or heart rhythm information that could potentially be obtained via home monitoring, at the time of her visit. I reminded Javier Mamone to expect a phone call at the time of her visit if her visit.  Did the patient verbally acknowledge consent to treatment? YES PT GAVE VERBAL CONSENT AGAIN TODAY AT HER EARLIER VIDEO VISIT WITH DR NELSON--THIS NEXT VIDEO VISIT IS A 3 MONTH FOLLOW-UP VIRTUAL VISIT WITH DR [name], SCHEDULED FOR 7/13 AT 9:20 AM AND PATIENT WENT AHEAD AND GAVE [place] VERBAL CONSENT TO TREAT HER THIS WAY AGAIN.  [name], LPN 11/26/8297 37:16 PM  CONSENT FOR TELE-HEALTH VISIT - PLEASE REVIEW  I hereby voluntarily request, consent and authorize [facility] and its employed or contracted physicians, physician assistants, nurse practitioners or other licensed health care professionals (the Practitioner), to provide me with telemedicine health care services (the "Services") as deemed necessary by the treating Practitioner. I acknowledge and consent to receive the Services by the Practitioner via telemedicine. I understand that the telemedicine visit will involve communicating with the Practitioner through live audiovisual communication technology and the disclosure of certain medical information by electronic transmission. I acknowledge that I have been given the opportunity to request an in-person assessment or other available alternative prior to the telemedicine visit and am voluntarily participating in  the telemedicine visit.  I understand that I have the right to withhold or withdraw my consent to the use of telemedicine in the course of my care at any time, without affecting my right to future care or treatment, and that the Practitioner or I may terminate the telemedicine visit at any time. I understand that I have the right to inspect all information obtained  and/or recorded in the course of the telemedicine visit and may receive copies of available information for a reasonable fee.  I understand that some of the potential risks of receiving the Services via telemedicine include:  Marland Kitchen Delay or interruption in medical evaluation due to technological equipment failure or disruption; . Information transmitted may not be sufficient (e.g. poor resolution of images) to allow for appropriate medical decision making by the Practitioner; and/or  . In rare instances, security protocols could fail, causing a breach of personal health information.  Furthermore, I acknowledge that it is my responsibility to provide information about my medical history, conditions and care that is complete and accurate to the best of my ability. I acknowledge that Practitioner's advice, recommendations, and/or decision may be based on factors not within their control, such as incomplete or inaccurate data provided by me or distortions of diagnostic images or specimens that may result from electronic transmissions. I understand that the practice of medicine is not an exact science and that Practitioner makes no warranties or guarantees regarding treatment outcomes. I acknowledge that I will receive a copy of this consent concurrently upon execution via email to the email address I last provided but may also request a printed copy by calling the office of [facility].    I understand that my insurance will be billed for this visit.   I have read or had this consent read to me. . I understand the contents of this consent, which adequately explains the benefits and risks of the Services being provided via telemedicine.  . I have been provided ample opportunity to ask questions regarding this consent and the Services and have had my questions answered to my satisfaction. . I give my informed consent for the services to be provided through the use of telemedicine in my medical care     [name], LPN 09/28/2295 98:92 PM

## 2018-08-04 NOTE — Telephone Encounter (Signed)
-----   Message from Jennefer Bravo sent at 08/04/2018  3:53 PM EDT ----- Regarding: RE: pt needs 14 day long term zio monitor per Dr [name] from virtual visit today Patient called.  Will enroll for 14 day Zio XT to be mailed.  Thanks, Darrick Penna ----- Message ----- From: [name], LPN Sent: 0/11/8108  31:59 PM EDT To: Kendall Flack, # Subject: pt needs 14 day long term zio monitor per Dr#  This pt had a virtual video visit with Dr [name] this morning and she ordered for her to be set up to have a 14 day long term zio patch monitor for palpitations.  Pt is aware that you will be calling her to have this all set up and mailed.  Can you please call and arrange and shoot me the date you are mailing this to her?  Thanks so much, [employer]

## 2018-08-04 NOTE — Progress Notes (Signed)
Virtual Visit via Video Note   This visit type was conducted due to national recommendations for restrictions regarding the COVID-19 Pandemic (e.g. social distancing) in an effort to limit this patient's exposure and mitigate transmission in our community.  Due to her co-morbid illnesses, this patient is at least at moderate risk for complications without adequate follow up.  This format is felt to be most appropriate for this patient at this time.  All issues noted in this document were discussed and addressed.  A limited physical exam was performed with this format.  Please refer to the patient's chart for her consent to telehealth for [facility]   Evaluation Performed:  Follow-up visit  Date:  08/04/2018   ID:  Carrol Hougland, DOB 05-04-1950, MRN [id number]  Patient Location: Home  Provider Location: Home  PCP:  Nolene Ebbs, MD  Cardiologist:  Ena Dawley, MD  Electrophysiologist:  None   Chief Complaint: palpitations  History of Present Illness:    Victoria Cardenas is a 68 y.o. female who presents via audio/video conferencing for a telehealth visit today.  Video conference attempted but failed, converted to a phone meeting.  68 y.o. female with history of HTN, hyperlipidemia, glaucoma, mild MR followed for hypertension and palpitations. She's previously followed for history of abnormal stress test. Reportedly while in [place] in the past she underwent an exercise treadmill stress test that was positive for 1 mm ST depressions however follow-up exercise nuclear stress test showed no ischemia and no prior scar. There is a report scanned in from 2013 indicating normal imaging, LVEF >65%, "during treadmill exercise and Adenosine infusion, the patient developed dyspnea, rare PVCs and positive ST segment response without chest pain." It sounds like this was managed conservatively. 2D echo 2016 showed EF 60-65%, mild aortic sclerosis, mild MR.  H/o LE edema and palpitations with  amlodipine.  08/04/2018 - 9 months follow up, she has developed URI 2 weeks ago and was given allergy pills by her PCP, however she has not started ar her symptoms have resolved. She has developed palpitations associated with dizziness since then. They are starting and terminating all of a sudden, happening several times a day and lasting up to 10 minutes. No syncope. No fever, chills, no chest pain, no LE edema.  The patient does not have symptoms concerning for COVID-19 infection (fever, chills, cough, or new shortness of breath).    Past Medical History:  Diagnosis Date  . Breast cancer ([place]) 2005   right breast  . Glaucoma   . Hypercholesteremia   . Hypertension    Past Surgical History:  Procedure Laterality Date  . ABDOMINAL HYSTERECTOMY    . BREAST EXCISIONAL BIOPSY Left pt unsure   . 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 aspirin 81 MG chewable tablet Chew 81-162 mg by mouth as needed (for headaches, discomfort, or intermittent chest pains).  . ibandronate (BONIVA) 150 MG tablet Take 150 mg by mouth every 30 (thirty) days.  Marland Kitchen LUMIGAN 0.01 % SOLN Place 1 drop into both eyes at bedtime.     Allergies:   Amlodipine; Penicillins; Ciprofloxacin; and Simvastatin   Social History   Tobacco Use  . Smoking status: Never Smoker  . Smokeless tobacco: Never Used  Substance Use Topics  . Alcohol use: No  . Drug use: No     Family Hx: The patient's family history includes Breast cancer in her maternal aunt and maternal  grandmother; Breast cancer (age of onset: 75) in her mother; Hypertension in her mother.  ROS:   Please see the history of present illness.    All other systems reviewed and are negative.   Prior CV studies:   The following studies were reviewed today:  06/14/2014 - Left ventricle: The cavity size was normal. Systolic function was normal. The estimated ejection fraction was in the range of 60% to 65%. Wall motion was  normal; there were no regional wall motion abnormalities. - Aortic valve: Mild calcification, consistent with sclerosis. - Mitral valve: There was mild regurgitation. - Atrial septum: There was increased thickness of the septum, consistent with lipomatous hypertrophy. - Pulmonic valve: There was no regurgitation.  Labs/Other Tests and Data Reviewed:    EKG:  No ECG reviewed.  Recent Labs: No results found for requested labs within last 8760 hours.   Recent Lipid Panel No results found for: CHOL, TRIG, HDL, CHOLHDL, LDLCALC, LDLDIRECT  Wt Readings from Last 3 Encounters:  08/04/18 125 lb (56.7 kg)  05/29/18 130 lb (59 kg)  05/29/18 130 lb (59 kg)     Objective:    Vital Signs:  BP 131/75   Pulse 77   Ht 5\' 3"  (1.6 m)   Wt 125 lb (56.7 kg)   BMI 22.14 kg/m    Well nourished, well developed female in no acute distress.  ASSESSMENT & PLAN:    1. Palpitations - check TSH, fT3,4, CBC, CMP tomorrow, also arrange for a 10 day Zio patch monitor. She has h/o of abnormal TSH in the past, but normal fT3,4, last checked in 2018. 2. Essential HTN - now controlled off anti-hypertensives with diet only. 3. Hyperlipidemia - being managed by PCP. It has normalized with diet, we will obtain records from primary care physician.   COVID-19 Education: The signs and symptoms of COVID-19 were discussed with the patient and how to seek care for testing (follow up with PCP or arrange E-visit).  The importance of social distancing was discussed today.  Time:   Today, I have spent 30 minutes with the patient with telehealth technology discussing the above problems.     Medication Adjustments/Labs and Tests Ordered: Current medicines are reviewed at length with the patient today.  Concerns regarding medicines are outlined above.  Tests Ordered: No orders of the defined types were placed in this encounter.  Medication Changes: No orders of the defined types were placed in this encounter.    Disposition:  Follow up in 3 month(s)  Signed, Ena Dawley, MD  08/04/2018 11:15 AM    [facility]

## 2018-08-04 NOTE — Patient Instructions (Addendum)
Medication Instructions:   Your physician recommends that you continue on your current medications as directed. Please refer to the Current Medication list given to you today.  If you need a refill on your cardiac medications before your next appointment, please call your pharmacy.    Lab work:  THIS Friday 08/06/18 AT OUR OFFICE--TO CHECK TSH, FREE T3, FREE T4, CBC W DIFF, AND CMET PER DR NELSON--PLEASE KEEP THIS LAB APPOINTMENT.  If you have labs (blood work) drawn today and your tests are completely normal, you will receive your results only by: Marland Kitchen MyChart Message (if you have MyChart) OR . A paper copy in the mail If you have any lab test that is abnormal or we need to change your treatment, we will call you to review the results.    Testing/Procedures:  14 DAY LONG-TERM MONITOR ZIO PATCH-OUR SCHEDULING DEPARTMENT AND OUR MONITOR TECHNICIANS WILL BE IN CONTACT WITH YOU ABOUT MAILING THIS MONITOR TO YOUR HOME ADDRESS WITH INSTRUCTIONS ON HOW TO PUT THIS ON.    Follow-Up:  WITH DR [name] ON Monday November 08, 2018 AT 9:20 AM FOR ANOTHER VIRTUAL VIDEO VISIT

## 2018-08-06 ENCOUNTER — Other Ambulatory Visit: Payer: Medicare Other

## 2018-08-06 ENCOUNTER — Other Ambulatory Visit: Payer: Self-pay

## 2018-08-06 DIAGNOSIS — R002 Palpitations: Secondary | ICD-10-CM

## 2018-08-06 DIAGNOSIS — I10 Essential (primary) hypertension: Secondary | ICD-10-CM

## 2018-08-06 DIAGNOSIS — R7989 Other specified abnormal findings of blood chemistry: Secondary | ICD-10-CM

## 2018-08-06 LAB — COMPREHENSIVE METABOLIC PANEL
ALT: 16 IU/L (ref 0–32)
AST: 14 IU/L (ref 0–40)
Albumin/Globulin Ratio: 1.8 (ref 1.2–2.2)
Albumin: 4.3 g/dL (ref 3.8–4.8)
Alkaline Phosphatase: 69 IU/L (ref 39–117)
BUN/Creatinine Ratio: 12 (ref 12–28)
BUN: 9 mg/dL (ref 8–27)
Bilirubin Total: 0.8 mg/dL (ref 0.0–1.2)
CO2: 25 mmol/L (ref 20–29)
Calcium: 9.4 mg/dL (ref 8.7–10.3)
Chloride: 103 mmol/L (ref 96–106)
Creatinine, Ser: 0.76 mg/dL (ref 0.57–1.00)
GFR calc Af Amer: 93 mL/min/{1.73_m2} (ref >59)
GFR calc non Af Amer: 80 mL/min/{1.73_m2} (ref >59)
Globulin, Total: 2.4 g/dL (ref 1.5–4.5)
Glucose: 105 mg/dL — ABNORMAL HIGH (ref 65–99)
Potassium: 4.1 mmol/L (ref 3.5–5.2)
Sodium: 143 mmol/L (ref 134–144)
Total Protein: 6.7 g/dL (ref 6.0–8.5)

## 2018-08-06 LAB — CBC WITH DIFFERENTIAL/PLATELET
Basophils Absolute: 0 10*3/uL (ref 0.0–0.2)
Basos: 0 %
EOS (ABSOLUTE): 0.2 10*3/uL (ref 0.0–0.4)
Eos: 4 %
Hematocrit: 41.2 % (ref 34.0–46.6)
Hemoglobin: 14.2 g/dL (ref 11.1–15.9)
Immature Grans (Abs): 0 10*3/uL (ref 0.0–0.1)
Immature Granulocytes: 0 %
Lymphocytes Absolute: 2 10*3/uL (ref 0.7–3.1)
Lymphs: 39 %
MCH: 31.1 pg (ref 26.6–33.0)
MCHC: 34.5 g/dL (ref 31.5–35.7)
MCV: 90 fL (ref 79–97)
Monocytes Absolute: 0.6 10*3/uL (ref 0.1–0.9)
Monocytes: 12 %
Neutrophils Absolute: 2.2 10*3/uL (ref 1.4–7.0)
Neutrophils: 45 %
Platelets: 237 10*3/uL (ref 150–450)
RBC: 4.56 x10E6/uL (ref 3.77–5.28)
RDW: 12.1 % (ref 11.7–15.4)
WBC: 5.1 10*3/uL (ref 3.4–10.8)

## 2018-08-06 LAB — TSH: TSH: 0.056 u[IU]/mL — ABNORMAL LOW (ref 0.450–4.500)

## 2018-08-06 LAB — T4, FREE: Free T4: 1.68 ng/dL (ref 0.82–1.77)

## 2018-08-06 LAB — T3, FREE: T3, Free: 4.3 pg/mL (ref 2.0–4.4)

## 2018-08-08 ENCOUNTER — Ambulatory Visit (INDEPENDENT_AMBULATORY_CARE_PROVIDER_SITE_OTHER): Payer: Medicare Other

## 2018-08-08 DIAGNOSIS — R7989 Other specified abnormal findings of blood chemistry: Secondary | ICD-10-CM | POA: Diagnosis not present

## 2018-08-08 DIAGNOSIS — R002 Palpitations: Secondary | ICD-10-CM

## 2018-08-08 DIAGNOSIS — I10 Essential (primary) hypertension: Secondary | ICD-10-CM | POA: Diagnosis not present

## 2018-08-09 ENCOUNTER — Telehealth: Payer: Self-pay | Admitting: *Deleted

## 2018-08-09 DIAGNOSIS — R7989 Other specified abnormal findings of blood chemistry: Secondary | ICD-10-CM

## 2018-08-09 DIAGNOSIS — R002 Palpitations: Secondary | ICD-10-CM

## 2018-08-09 MED ORDER — PROPRANOLOL HCL 20 MG PO TABS: 20.0000 mg | ORAL_TABLET | Freq: Two times a day (BID) | ORAL | 2 refills | Status: DC

## 2018-08-09 NOTE — Telephone Encounter (Signed)
Spoke with the pt and informed her that per Dr [name], her labs showed borderline thyroid levels, so she recommends that we refer her to Endocrinology for further evaluation of this, and start her on propranolol 20 mg po bid, for palpitations.  Confirmed the pharmacy of choice with the pt.  Informed the pt that I will place the referral to Endocrinology in the system and our [facility] schedulers will be calling her back to have this arranged. Pt verbalized understanding and agrees with this plan.

## 2018-08-09 NOTE — Telephone Encounter (Signed)
-----   Message from Dorothy Spark, MD sent at 08/09/2018  3:41 PM EDT ----- Borderline thyroid labs, please refer her to an endocrinologist. Please start her on propranolol 20 mg PO BID for palpitations.

## 2018-08-12 ENCOUNTER — Encounter (HOSPITAL_COMMUNITY): Payer: Self-pay

## 2018-08-12 ENCOUNTER — Emergency Department (HOSPITAL_COMMUNITY)
Admission: EM | Admit: 2018-08-12 | Discharge: 2018-08-12 | Disposition: A | Discharge status: Home / Self Care | Payer: Medicare Other | Attending: Emergency Medicine | Admitting: Emergency Medicine

## 2018-08-12 ENCOUNTER — Other Ambulatory Visit: Payer: Self-pay

## 2018-08-12 DIAGNOSIS — Z853 Personal history of malignant neoplasm of breast: Secondary | ICD-10-CM | POA: Diagnosis not present

## 2018-08-12 DIAGNOSIS — I10 Essential (primary) hypertension: Secondary | ICD-10-CM | POA: Insufficient documentation

## 2018-08-12 DIAGNOSIS — H5319 Other subjective visual disturbances: Secondary | ICD-10-CM | POA: Diagnosis not present

## 2018-08-12 DIAGNOSIS — Z79899 Other long term (current) drug therapy: Secondary | ICD-10-CM | POA: Diagnosis not present

## 2018-08-12 DIAGNOSIS — H43399 Other vitreous opacities, unspecified eye: Secondary | ICD-10-CM | POA: Diagnosis present

## 2018-08-12 MED ORDER — FLUORESCEIN SODIUM 1 MG OP STRP
1.0000 | ORAL_STRIP | Freq: Once | OPHTHALMIC | Status: AC
  Administered 2018-08-12: 1 via OPHTHALMIC
  Filled 2018-08-12: qty 1

## 2018-08-12 MED ORDER — TETRACAINE HCL 0.5 % OP SOLN
2.0000 [drp] | Freq: Once | OPHTHALMIC | Status: AC
  Administered 2018-08-12: 22:00:00 2 [drp] via OPHTHALMIC
  Filled 2018-08-12: qty 4

## 2018-08-12 NOTE — Discharge Instructions (Signed)
Please contact your eye doctor tomorrow and make an appointment for a sooner follow-up visit.  Return to the ER for worsening symptoms, loss of vision, double vision, headache, slurred speech, numbness, weakness.

## 2018-08-12 NOTE — ED Triage Notes (Signed)
Pt presents POV for eval of R eye floaters w/ flashes. Pt reports she spoke to opthomologist  today and the RN there advised her to present to ED if she developed flashing sensation. Pt reports she noted flashing sensation around 2000 tonight. Denies associated neuro symptoms. Reports persistent brown/gray floaters, no known eye injury or trauma. Pt denies hx of same.

## 2018-08-12 NOTE — ED Notes (Signed)
Pt is near sighted. Wears glasses. In place during Visual Acuity.

## 2018-08-12 NOTE — ED Provider Notes (Signed)
[facility] EMERGENCY DEPARTMENT Provider Note   CSN: [id number] Arrival date & time: 08/12/18  2128    History   Chief Complaint Chief Complaint  Patient presents with  . Eye Problem    HPI Victoria Cardenas is a 68 y.o. female.     Patient presents to the emergency department with a chief complaint of an episode of flashers and floaters today.  She spoke with her ophthalmologist's nurse, who advised her to come to the emergency department for evaluation.  She has a history of glaucoma.  She denies any headache, vision loss, double vision, curtainlike sensation, eye pain, pain with eye movement, foreign body sensation, or trauma.  She has not taken anything for her symptoms.  She states that the flashers only lasted a brief period of time and have now completely resolved.  She states that she still has some floaters, but she has had these intermittently for years.  She denies any numbness, weakness, tingling, slurred speech.  Denies any other associated symptoms.  There are no aggravating factors.  The history is provided by the patient. No language interpreter was used.    Past Medical History:  Diagnosis Date  . Breast cancer ([place]) 2005   right breast  . Glaucoma   . Hypercholesteremia   . Hypertension     Patient Active Problem List   Diagnosis Date Noted  . Heart palpitations 12/23/2016  . Essential hypertension 12/23/2016  . Hyperlipidemia 12/23/2016  . Hyperglycemia 12/23/2016  . Eustachian tube dysfunction, right 02/27/2016  . Ear pressure, right 02/06/2016  . 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 ABDOMINAL HYSTERECTOMY    . BREAST EXCISIONAL BIOPSY Left pt unsure   . 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      Home Medications    Prior to Admission medications   Medication Sig Start Date End Date Taking?  Authorizing Provider  aspirin 81 MG chewable tablet Chew 81-162 mg by mouth as needed (for headaches, discomfort, or intermittent chest pains).    [provider]  ibandronate (BONIVA) 150 MG tablet Take 150 mg by mouth every 30 (thirty) days. 07/22/18   [provider]  LUMIGAN 0.01 % SOLN Place 1 drop into both eyes at bedtime. 05/14/18   [provider]  propranolol (INDERAL) 20 MG tablet Take 1 tablet (20 mg total) by mouth 2 (two) times daily. 08/09/18   Dorothy Spark, MD    Family History Family History  Problem Relation Age of Onset  . Hypertension Mother   . Breast cancer Mother 53  . Breast cancer Maternal Aunt   . Breast cancer Maternal Grandmother     Social History Social History   Tobacco Use  . Smoking status: Never Smoker  . Smokeless tobacco: Never Used  Substance Use Topics  . Alcohol use: No  . Drug use: No     Allergies   Amlodipine; Penicillins; Ciprofloxacin; and Simvastatin   Review of Systems Review of Systems  All other systems reviewed and are negative.    Physical Exam Updated Vital Signs BP (!) 181/94 (BP Location: Left Arm)   Pulse 69   Temp 98 F (36.7 C) (Oral)   Resp 18   Ht 5\' 3"  (1.6 m)   Wt 57 kg   SpO2 98%   BMI 22.26 kg/m   Physical  Exam Vitals signs and nursing note reviewed.  Constitutional:      Appearance: She is well-developed.  HENT:     Head: Normocephalic and atraumatic.  Eyes:     Conjunctiva/sclera: Conjunctivae normal.     Comments:   Visual Acuity  Right Eye Distance: 20/32 Left Eye Distance: 20/25   Normal EOMs Conjunctiva normal PERRL Visualized funduscopic appears normal, though limited exam  Neck:     Musculoskeletal: Normal range of motion.  Cardiovascular:     Rate and Rhythm: Normal rate.  Pulmonary:     Effort: Pulmonary effort is normal.  Abdominal:     General: There is no distension.  Musculoskeletal: Normal range of motion.  Skin:    General: Skin is  dry.  Neurological:     Mental Status: She is alert and oriented to person, place, and time.     Comments: CN 3-12 intact, speech is clear, movements are goal oriented  Psychiatric:        Behavior: Behavior normal.        Thought Content: Thought content normal.        Judgment: Judgment normal.      ED Treatments / Results  Labs (all labs ordered are listed, but only abnormal results are displayed) Labs Reviewed - No data to display  EKG None  Radiology No results found.  Procedures Procedures (including critical care time)  Medications Ordered in ED Medications  tetracaine (PONTOCAINE) 0.5 % ophthalmic solution 2 drop (has no administration in time range)  fluorescein ophthalmic strip 1 strip (has no administration in time range)     Initial Impression / Assessment and Plan / ED Course  I have reviewed the triage vital signs and the nursing notes.  Pertinent labs & imaging results that were available during my care of the patient were reviewed by me and considered in my medical decision making (see chart for details).        Patient with an episode of flashers in her right eye.  Symptoms have resolved.  Was advised to come to the emergency department by her ophthalmologist on-call nurse.  Denies any pain, double vision, loss of vision, headache, numbness, weakness, tingling.  Doubt TIA or stroke.  Doubt retinal detachment, no curtain like sensation.  Case discussed with Dr. Francia Greaves, who agrees with plan for discharge and follow-up with ophthalmology.  Return precautions given.  Final Clinical Impressions(s) / ED Diagnoses   Final diagnoses:  Photopsia of right eye    ED Discharge Orders    None       Montine Circle, PA-C 08/12/18 2304    Valarie Merino, MD 08/12/18 [phone]

## 2018-08-12 NOTE — ED Notes (Signed)
  Fluorescein and tetracaine at bedside

## 2018-08-19 ENCOUNTER — Ambulatory Visit: Payer: Medicare Other | Admitting: Cardiology

## 2018-08-25 ENCOUNTER — Other Ambulatory Visit: Payer: Self-pay

## 2018-08-25 ENCOUNTER — Ambulatory Visit (INDEPENDENT_AMBULATORY_CARE_PROVIDER_SITE_OTHER): Payer: Medicare Other | Admitting: Internal Medicine

## 2018-08-25 ENCOUNTER — Encounter: Payer: Self-pay | Admitting: Internal Medicine

## 2018-08-25 VITALS — BP 148/84 | HR 68 | Temp 97.4°F | Ht 63.0 in | Wt 125.0 lb

## 2018-08-25 DIAGNOSIS — E0590 Thyrotoxicosis, unspecified without thyrotoxic crisis or storm: Secondary | ICD-10-CM | POA: Diagnosis not present

## 2018-08-25 DIAGNOSIS — M810 Age-related osteoporosis without current pathological fracture: Secondary | ICD-10-CM | POA: Diagnosis not present

## 2018-08-25 DIAGNOSIS — R002 Palpitations: Secondary | ICD-10-CM

## 2018-08-25 DIAGNOSIS — E042 Nontoxic multinodular goiter: Secondary | ICD-10-CM | POA: Diagnosis not present

## 2018-08-25 MED ORDER — METHIMAZOLE 10 MG PO TABS: 10.0000 mg | ORAL_TABLET | Freq: Every day | ORAL | 6 refills | Status: DC

## 2018-08-25 NOTE — Progress Notes (Signed)
Name: Victoria Cardenas  MRN/ DOB: [id number], 06/13/1950    Age/ Sex: 68 y.o., female    PCP: Victoria Ebbs, MD   Reason for Endocrinology Evaluation: Subclinical hyperthyroidism      Date of Initial Endocrinology Evaluation: 08/25/2018     HPI: Victoria Cardenas is a 68 y.o. female with a past medical history of HTN, Hx of breast cancer (S/P lumpectomy )Osteoporosis . The patient presented for initial endocrinology clinic visit on 08/25/2018 for consultative assistance with her subclinical hyperthyroidism.   Pt was noted with low TSH in 11/2016, at the time she presented to the ED for palpitations.   In April, 2020 she presented again with palpitations and sob.   She denies weight loss, anxiety or jittery sensation  No tremors or diarrhea.   Denies local neck symptoms.   Denies prior exposure to radiation   No FH of thyroid disease.     Osteoporosis : She was diagnosed with osteoporosis 2017, and was started on boniva at the time, but pt stopped it due to palpitations and diarrhea ~ 2 yrs ago.  Currently she is only on Calcium and Vitamin D 2 tablets daily    Menarche at age : 57 Menopausal at age : hysterectomy 1979  Fracture Hx: no Hx of HRT: no FH of osteoporosis or hip fracture:  no Prior Hx of anti-estrogenic therapy :no Prior Hx of anti-resorptive therapy : yes from 2017-2019   HISTORY:  Past Medical History:  Past Medical History:  Diagnosis Date  . Breast cancer ([place]) 2005   right breast  . Glaucoma   . Hypercholesteremia   . Hypertension    Past Surgical History:  Past Surgical History:  Procedure Laterality Date  . ABDOMINAL HYSTERECTOMY    . BREAST EXCISIONAL BIOPSY Left pt unsure   . 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 She has never used smokeless tobacco. She reports that she does not drink alcohol or use drugs. Family History: family history includes Breast cancer in her maternal  aunt and maternal grandmother; Breast cancer (age of onset: 34) in her mother; Hypertension in her mother.   HOME MEDICATIONS: Allergies as of 08/25/2018      Reactions   Amlodipine Swelling, Palpitations   Swelling, heart pounding   Penicillins Hives   Has patient had a PCN reaction causing immediate rash, facial/tongue/throat swelling, SOB or lightheadedness with hypotension: yes Has patient had a PCN reaction causing severe rash involving mucus membranes or skin necrosis: no Has patient had a PCN reaction that required hospitalization: no Has patient had a PCN reaction occurring within the last 10 years: no If all of the above answers are "NO", then may proceed with Cephalosporin   Ciprofloxacin Palpitations   Simvastatin Other (See Comments)   Increased urination      Medication List       Accurate as of August 25, 2018 10:29 AM. Always use your most recent med list.        aspirin 81 MG chewable tablet Chew 81-162 mg by mouth as needed (for headaches, discomfort, or intermittent chest pains).   ibandronate 150 MG tablet Commonly known as:  BONIVA Take 150 mg by mouth every 30 (thirty) days.   Lumigan 0.01 % Soln Generic drug:  bimatoprost Place 1 drop into both eyes at bedtime.   methimazole 10 MG tablet Commonly known as:  TAPAZOLE Take 1 tablet (10 mg  total) by mouth daily.   propranolol 20 MG tablet Commonly known as:  INDERAL Take 1 tablet (20 mg total) by mouth 2 (two) times daily.         REVIEW OF SYSTEMS: A comprehensive ROS was conducted with the patient and is negative except as per HPI and below:  Review of Systems  Constitutional: Negative for fever and weight loss.  HENT: Positive for sore throat. Negative for congestion.   Eyes: Negative for blurred vision and pain.  Respiratory: Positive for shortness of breath. Negative for cough.   Cardiovascular: Positive for palpitations. Negative for chest pain.  Gastrointestinal: Negative for diarrhea  and nausea.  Genitourinary: Negative for frequency.  Neurological: Negative for tingling and tremors.  Endo/Heme/Allergies: Negative for polydipsia.  Psychiatric/Behavioral: Negative for depression. The patient is not nervous/anxious.        OBJECTIVE:  VS: BP (!) 148/84 (BP Location: Left Arm, Patient Position: Sitting, Cuff Size: Normal)   Pulse 68   Temp (!) 97.4 F (36.3 C)   Ht 5\' 3"  (1.6 m)   Wt 125 lb (56.7 kg)   SpO2 98%   BMI 22.14 kg/m    Wt Readings from Last 3 Encounters:  08/25/18 125 lb (56.7 kg)  08/12/18 125 lb 10.6 oz (57 kg)  08/04/18 125 lb (56.7 kg)     EXAM: General: Pt appears well and is in NAD  Hydration: Well-hydrated with moist mucous membranes and good skin turgor  Eyes: External eye exam normal without stare, lid lag or exophthalmos.  EOM intact.  PERRL.  Ears, Nose, Throat: Hearing: Grossly intact bilaterally Dental: Good dentition  Throat: Clear without mass, erythema or exudate  Neck: General: Supple without adenopathy. Thyroid: Thyroid size normal.  No goiter or nodules appreciated. No thyroid bruit.  Lungs: Clear with good BS bilat with no rales, rhonchi, or wheezes  Heart: Auscultation: RRR.Systolic murmur present  Abdomen: Normoactive bowel sounds, soft, nontender, without masses or organomegaly palpable  Extremities:  BL LE: No pretibial edema normal ROM and strength.  Skin: Hair: Texture and amount normal with gender appropriate distribution Skin Inspection: No rashes Skin Palpation: Skin temperature, texture, and thickness normal to palpation  Neuro: Cranial nerves: II - XII grossly intact  Motor: Normal strength throughout DTRs: 2+ and symmetric in UE without delay in relaxation phase  Mental Status: Judgment, insight: Intact Orientation: Oriented to time, place, and person Mood and affect: No depression, anxiety, or agitation     DATA REVIEWED: Results for Victoria, Cardenas (MRN [id number]) as of 08/25/2018 09:42  Ref.  Range 12/23/2016 09:30 04/02/2017 09:08 08/06/2018 10:42  TSH Latest Ref Range: 0.450 - 4.500 uIU/mL 0.198 (L) 0.356 (L) 0.056 (L)  Triiodothyronine,Free,Serum Latest Ref Range: 2.0 - 4.4 pg/mL  3.4 4.3  T4,Free(Direct) Latest Ref Range: 0.82 - 1.77 ng/dL  1.24 1.68    Right Mid Nodule FNA (11/27/2015) CONSISTENT WITH BENIGN FOLLICULAR NODULE (BETHESDA CATEGORY II).  DXA (12/22/2017) AP Spine  L1-L4      12/22/2017    68.4         -3.1    0.815 g/cm2 AP Spine  L1-L4      08/07/2015    66.0         -3.0    0.823 g/cm2  DualFemur Neck Left  12/22/2017    68.4         -3.3    0.582 g/cm2 DualFemur Neck Left  08/07/2015    66.0         -  3.2    0.591 g/cm2  DualFemur Total Mean 12/22/2017    68.4         -2.7    0.662 g/cm2 DualFemur Total Mean 08/07/2015    66.0         -2.7    0.666 g/cm2   ASSESSMENT/PLAN/RECOMMENDATIONS:   Subclinical Hyperthyroidism - The causes of subclinical hyperthyroidism are the same as the causes of overt hyperthyroidism, and like overt hyperthyroidism, subclinical hyperthyroidism can be persistent or transient. Common causes of subclinical hyperthyroidism include excessive thyroid hormone therapy (exogenous subclinical hyperthyroidism), autonomously functioning thyroid adenomas and multinodular goiters (endogenous subclinical hyperthyroidism), or Graves' disease (endogenous subclinical hyperthyroidism).   Most patients with subclinical hyperthyroidism have no clinical manifestations of hyperthyroidism, and those symptoms that are present (eg, tachycardia, tremor, dyspnea on exertion, weight loss) are mild and nonspecific." However, subclinical hyperthyroidism is associated with an increased risk of atrial fibrillation and, primarily in postmenopausal women, a decrease in bone mineral density. - Since she is already having palpitations and osteoporosis , will start thionamide treatment.  - I do suspect that the cause of her subclinical hyperthyroidism is autonomous  nodule (s) but its difficult to be certain without thyroid uptake and scan , which at this time is going to be on hold due to the pandemic.  - We will start Methimazole, and she understands this is until we are able to perform uptake and scan, unless TRAb levels come back positive which will then be consistent with graves' disease.  - We discussed with pt the benefits of methimazole in the Tx of hyperthyroidism, as well as the possible side effects/complications of anti-thyroid drug Tx (specifically detailing the rare, but serious side effect of agranulocytosis). She was informed of need for regular thyroid function monitoring while on methimazole to ensure appropriate dosage without over-treatment. As well, we discussed the possible side effects of methimazole including the chance of rash, the small chance of liver irritation/juandice and the <=1 in 300-400 chance of sudden onset agranulocytosis.  We discussed importance of seeking medical attention promptly (and stopping methimazole) if she were to develop significant fever with severe sore throat of other evidence of acute infection.     2. Multinodular Goiter   - No local neck symptoms  - This could be the main reason for subclinical hyperthyroidism  - No further intervention at this time without performing thyroid uptake and scan first.  - She is S/P benign FNA of the right mid nodule in 2018 , other sub-centimeter nodules present on ultrasound bilaterally but did not require further follow up.    Medications : Methimazole 10 mg daily  Labs in 4 weeks      3. Osteoporosis   - I have explained to her the increased risk of fracture - We also discussed hyperthyroidism as a contributing factor to osteoporosis - We discussed the importance of continuing with calcium and vitamin D - We also discussed the importance of starting anti-resorptove therapy. She has boniva listed but is not compliant with taking and has declined any medication for  osteoporosis at this time.  - Will revisit this subject again on next visit.   4. Palpitations  - Propranolol per cardiology    F/u 2 months     Signed electronically by: Mack Guise, MD  [facility]  [address] [facility] [address] [place], [place] [address] Phone: [phone] FAX: [phone]   CC: Victoria Cardenas, [place] [address] Phone:  [phone] Fax: [phone]   Return to Endocrinology clinic as below: Future Appointments  Date Time Provider [place]  09/22/2018 10:00 AM [facility] None  10/20/2018 10:10 AM Kedar Sedano, Melanie Crazier, MD [facility] None  11/08/2018  9:20 AM Dorothy Spark, MD [facility]

## 2018-08-25 NOTE — Patient Instructions (Signed)
We recommend that you follow these hyperthyroidism instructions at home:  1) Take Methimazole 10 mg once a day  If you develop severe sore throat with high fevers OR develop unexplained yellowing of your skin, eyes, under your tongue, severe abdominal pain with nausea or vomiting --> then please get evaluated immediately.  2) Get repeat thyroid labs in 4 weeks .   It is ESSENTIAL to get follow-up labs to help avoid over or undertreatment of your hyperthyroidism - both of which can be dangerous to your health.

## 2018-08-27 ENCOUNTER — Telehealth: Payer: Self-pay | Admitting: Internal Medicine

## 2018-08-27 NOTE — Telephone Encounter (Signed)
Pt stated that she has not started the methimazole due to her sore throat and that she has contacted her PCP and that she would like to wait until she is referred to ENT because she does not want the mediation to make her throat worse

## 2018-08-27 NOTE — Telephone Encounter (Signed)
Please advise 

## 2018-08-27 NOTE — Telephone Encounter (Signed)
Patient states that the doctor prescribed her methimazole (TAPAZOLE) 10 MG tablet and she has been having a sore throat and wasn't sure if she should continue taken it.  Please Advise, Thanks

## 2018-08-30 ENCOUNTER — Other Ambulatory Visit: Payer: Self-pay

## 2018-09-01 NOTE — Telephone Encounter (Signed)
Patient states she started taking methimazole on Monday and she has been tired, upset stomach, heart palpations, headaches, bloating, and swelling in her right foot.  Please Advise, Thanks

## 2018-09-01 NOTE — Telephone Encounter (Signed)
Please advise 

## 2018-09-01 NOTE — Telephone Encounter (Signed)
Scheduled for 09/10/18 @10 :30

## 2018-09-02 ENCOUNTER — Telehealth: Payer: Self-pay | Admitting: *Deleted

## 2018-09-02 NOTE — Telephone Encounter (Signed)
-----   Message from Dorothy Spark, MD sent at 09/02/2018  1:47 PM EDT ----- SR to ST, short runs of SVT. She has been started on propranolol and methimazole, please ask her is she is feeling better and if she is having episodes of dizziness. Otherwise continue the same therapy.

## 2018-09-02 NOTE — Telephone Encounter (Signed)
Spoke with the pt and endorsed to her, her monitor results and recommendations per Dr [name]. Asked the pt if she was having anymore episodes of dizziness and lightheadedness, and she replied yes. Pt did state however, state she never picked up her propranolol, because she wanted to see her Endocrinologist we referred her to first, to make sure this medicine was really needed or not. Pt states that her Endocrinologist started her on methimazole, and she had to stop taking that, because it was causing her severe diarrhea and GI issues.  Pt states that her Endocrinologist advised her to stop that medicine and they will see her next Friday in the office for further evaluation and to further advise on a different thyroid agent.  So the pt is not on propanolol or methimazole at this time. Advised the pt that given her monitor results, and her dizziness, she needs to go ahead today and pick up her propranolol from the pharmacy, and take 1 dose tonight then start her BID regimen of this med tomorrow. Lots of pt education provided on the need for this med.  Advised the pt to follow-up as planned with her Endocrinologist next Friday.  Will discontinue her methimazole out of her med list and update this in her allergies.  Will make Dr [name] aware of this message as well. Pt verbalized understanding and agrees with this plan.

## 2018-09-04 ENCOUNTER — Telehealth: Payer: Self-pay | Admitting: Cardiology

## 2018-09-04 DIAGNOSIS — R002 Palpitations: Secondary | ICD-10-CM

## 2018-09-04 DIAGNOSIS — R7989 Other specified abnormal findings of blood chemistry: Secondary | ICD-10-CM

## 2018-09-04 MED ORDER — PROPRANOLOL HCL 20 MG PO TABS: 10.0000 mg | ORAL_TABLET | Freq: Two times a day (BID) | ORAL | 2 refills | Status: DC

## 2018-09-04 NOTE — Addendum Note (Signed)
Addended by: Daune Perch D on: 09/04/2018 03:58 PM   Modules accepted: Orders

## 2018-09-04 NOTE — Telephone Encounter (Signed)
I called pt and advised her to decrease her propranolol dose to 10 mg (1/2 pill) 2 times daily. She is not sure if she feels any better with having skipped her morning dose. I advised her to call us if she continues to have problems. She agrees.

## 2018-09-04 NOTE — Telephone Encounter (Signed)
Pt called answering service with c/o medication side effects. I called her back and she is a little vague with multiple symptoms including slow heart beat alternating with fast heart beat, mild headache in the temple region, face flushed and chills (no fever that she knows of). She had been placed on methimazole recently but had to stop it due to side effects. She had just started taking propranolol 2 days ago and noted the new symptoms yesterday.  I am not sure what is causing her symptoms. I told her that she can hold off on the propranolol for the rest of the weekend and call the office on Monday to review with Dr. [name].  I also advised that she can always call us again if her symptoms worsen.  She feels good about this plan.

## 2018-09-04 NOTE — Telephone Encounter (Signed)
Please ask her to cut it in half to take 10 mg po bid and let us know about side efects

## 2018-09-06 ENCOUNTER — Telehealth: Payer: Self-pay

## 2018-09-06 NOTE — Telephone Encounter (Signed)
Message received from triage.  Chart reviewed.  It appears that late last week her propranolol was decreased to 10 mg twice daily.  She is also following with endocrinology for thyroid disease. She was started on methimazole but is no longer taking this for side effects.   She states that after taking propranaolol, she feels rapid heart beat with flushing, followed by slow heart rate "can barely feel." She doesn't know the heart rates.  I had her take her blood pressure while on the phone with me and it was 157/83 with a heart rate of 70.  These palpitations and changes in heart rate may be related to her thyroid.  I advised her to continue taking the propranolol for now.  The next time she feels a slowed heartbeat I instructed her on how to take her pulse.  She will call our office if her heart rate is below 50 bpm.  I will asked triage to set her up for a virtual visit with Dr. [name] on 09/08/18.   Tami Lin Jamario Colina, PA-C 09/06/2018, 12:09 PM

## 2018-09-06 NOTE — Telephone Encounter (Signed)
Pt states that about 45 min after taking her Propranolol (which was recently reduced) her races and then slows done significantly, then her eyes begin hurting and turning red. She denies any CP, dizziness, sweating or fever. However, she does say she is still feeling lightheaded after taking the medication. Asked pt to take her BP and HR in which she states she just got home and needs to get settled and will call back with her vitals. Will route to [facility] for advisement.

## 2018-09-06 NOTE — Telephone Encounter (Signed)
Pt called back to let us know that she is still having issues with her medication.

## 2018-09-06 NOTE — Telephone Encounter (Signed)
Routing to triage to address as Victoria Cardenas is not in the office today

## 2018-09-06 NOTE — Telephone Encounter (Signed)
[facility] advised to schedule pt for video with [name] this Wednesday 5/13-unable to find an open time slot. Will route to [facility] for advisement on [employer] scheduling requests.

## 2018-09-07 NOTE — Telephone Encounter (Signed)
Virtual Visit Pre-Appointment Phone Call  "(Name), I am calling you today to discuss your upcoming appointment. We are currently trying to limit exposure to the virus that causes COVID-19 by seeing patients at home rather than in the office."  1. "What is the BEST phone number to call the day of the visit?" - include this in appointment notes-YES UPDATED IN APPT NOTES  2. Do you have or have access to (through a family member/friend) a smartphone with video capability that we can use for your visit?" a. If yes - list this number in appt notes as cell (if different from BEST phone #) and list the appointment type as a VIDEO visit in appointment notes b.  3. Confirm consent - "In the setting of the current Covid19 crisis, you are scheduled for a ( video) visit with Dr [name] on 5/13 at 12:40 pm..  Just as we do with many in-office visits, in order for you to participate in this visit, we must obtain consent.  If you'd like, I can send this to your mychart (if signed up) or email for you to review.  Otherwise, I can obtain your verbal consent now.  All virtual visits are billed to your insurance company just like a normal visit would be.  By agreeing to a virtual visit, we'd like you to understand that the technology does not allow for your provider to perform an examination, and thus may limit your provider's ability to fully assess your condition. If your provider identifies any concerns that need to be evaluated in person, we will make arrangements to do so.  Finally, though the technology is pretty good, we cannot assure that it will always work on either your or our end, and in the setting of a video visit, we may have to convert it to a phone-only visit.  In either situation, we cannot ensure that we have a secure connection.  Are you willing to proceed?" STAFF: Did the patient verbally acknowledge consent to telehealth visit? Document YES/NO here: YES PT GAVE VERBAL CONSENT TO TREAT AS WELL AS  CONSENT ALREADY OBTAINED AT HER LAST VIRTUAL VISIT WITH DR [name].   4. Advise patient to be prepared - "Two hours prior to your appointment, go ahead and check your blood pressure, pulse, oxygen saturation, and your weight (if you have the equipment to check those) and write them all down. When your visit starts, your provider will ask you for this information. If you have an Apple Watch or Kardia device, please plan to have heart rate information ready on the day of your appointment. Please have a pen and paper handy nearby the day of the visit as well."  5. Give patient instructions to smartphone [email or web address] as below if video visit (depending on what platform provider is using) YES  6. Inform patient they will receive a phone call 15 minutes prior to their appointment time (may be from unknown caller ID) so they should be prepared to answer YES    TELEPHONE CALL NOTE  Victoria Cardenas has been deemed a candidate for a follow-up tele-health visit to limit community exposure during the Covid-19 pandemic. I spoke with the patient via phone to ensure availability of phone/video source, confirm preferred email & phone number, and discuss instructions and expectations.  I reminded Victoria Cardenas to be prepared with any vital sign and/or heart rhythm information that could potentially be obtained via home monitoring, at the time of her visit. I reminded Victoria Cardenas to expect a phone call prior to her visit.  [name], LPN 12/24/9369 6:96 AM     IF USING  [email or web address] - The patient will receive a link just prior to their visit by text. YES  PT HAS ALREADY USED THIS PLATFORM     FULL LENGTH CONSENT FOR TELE-HEALTH VISIT   I hereby voluntarily request, consent and authorize [facility] and its employed or contracted physicians, physician assistants, nurse practitioners or other licensed health care professionals (the Practitioner), to provide me with telemedicine health care services  (the Services") as deemed necessary by the treating Practitioner. I acknowledge and consent to receive the Services by the Practitioner via telemedicine. I understand that the telemedicine visit will involve communicating with the Practitioner through live audiovisual communication technology and the disclosure of certain medical information by electronic transmission. I acknowledge that I have been given the opportunity to request an in-person assessment or other available alternative prior to the telemedicine visit and am voluntarily participating in the telemedicine visit.  I understand that I have the right to withhold or withdraw my consent to the use of telemedicine in the course of my care at any time, without affecting my right to future care or treatment, and that the Practitioner or I may terminate the telemedicine visit at any time. I understand that I have the right to inspect all information obtained and/or recorded in the course of the telemedicine visit and may receive copies of available information for a reasonable fee.  I understand that some of the potential risks of receiving the Services via telemedicine include:   Delay or interruption in medical evaluation due to technological equipment failure or disruption;  Information transmitted may not be sufficient (e.g. poor resolution of images) to allow for appropriate medical decision making by the Practitioner; and/or   In rare instances, security protocols could fail, causing a breach of personal health information.  Furthermore, I acknowledge that it is my responsibility to provide information about my medical history, conditions and care that is complete and accurate to the best of my ability. I acknowledge that Practitioner's advice, recommendations, and/or decision may be based on factors not within their control, such as incomplete or inaccurate data provided by me or distortions of diagnostic images or specimens that may result  from electronic transmissions. I understand that the practice of medicine is not an exact science and that Practitioner makes no warranties or guarantees regarding treatment outcomes. I acknowledge that I will receive a copy of this consent concurrently upon execution via email to the email address I last provided but may also request a printed copy by calling the office of [place].    I understand that my insurance will be billed for this visit.   I have read or had this consent read to me.  I understand the contents of this consent, which adequately explains the benefits and risks of the Services being provided via telemedicine.   I have been provided ample opportunity to ask questions regarding this consent and the Services and have had my questions answered to my satisfaction.  I give my informed consent for the services to be provided through the use of telemedicine in my medical care  By participating in this telemedicine visit I agree to the above. YES PT GAVE VERBAL CONSENT TO TREAT AS WELL AS CONSENT ALREADY OBTAINED AT LAST VIRTUAL VISIT WITH DR [name]

## 2018-09-07 NOTE — Telephone Encounter (Signed)
Pt will see Dr [name] for a add on virtual visit as advised by Delia Heady on 5/11, for tomorrow 5/13 at 12:40 pm.  Pt aware of appt date and time and instructions were given to the pt about this visit as well as consent already obtained.

## 2018-09-08 ENCOUNTER — Other Ambulatory Visit: Payer: Self-pay

## 2018-09-08 ENCOUNTER — Telehealth (INDEPENDENT_AMBULATORY_CARE_PROVIDER_SITE_OTHER): Payer: Medicare Other | Admitting: Cardiology

## 2018-09-08 ENCOUNTER — Encounter: Payer: Self-pay | Admitting: Cardiology

## 2018-09-08 VITALS — BP 140/81 | HR 62 | Ht 63.0 in | Wt 125.0 lb

## 2018-09-08 DIAGNOSIS — R7989 Other specified abnormal findings of blood chemistry: Secondary | ICD-10-CM | POA: Diagnosis not present

## 2018-09-08 DIAGNOSIS — I472 Ventricular tachycardia: Secondary | ICD-10-CM | POA: Diagnosis not present

## 2018-09-08 DIAGNOSIS — I10 Essential (primary) hypertension: Secondary | ICD-10-CM

## 2018-09-08 DIAGNOSIS — I4729 Other ventricular tachycardia: Secondary | ICD-10-CM

## 2018-09-08 DIAGNOSIS — R002 Palpitations: Secondary | ICD-10-CM

## 2018-09-08 MED ORDER — METOPROLOL TARTRATE 25 MG PO TABS: 12.5000 mg | ORAL_TABLET | Freq: Two times a day (BID) | ORAL | 1 refills | Status: DC

## 2018-09-08 NOTE — Patient Instructions (Signed)
Medication Instructions:   STOP TAKING PROPRANOLOL NOW  START TAKING METOPROLOL TARTRATE 12.5 MG BY MOUTH TWICE DAILY  If you need a refill on your cardiac medications before your next appointment, please call your pharmacy.     Follow-Up:  WITH DAYNA DUNN PA-C AS A FOLLOW-UP VIRTUAL VIDEO VISIT ON October 04, 2018 AT 10:30 AM

## 2018-09-08 NOTE — Progress Notes (Signed)
Virtual Visit via Video Note   This visit type was conducted due to national recommendations for restrictions regarding the COVID-19 Pandemic (e.g. social distancing) in an effort to limit this patient's exposure and mitigate transmission in our community.  Due to her co-morbid illnesses, this patient is at least at moderate risk for complications without adequate follow up.  This format is felt to be most appropriate for this patient at this time.  All issues noted in this document were discussed and addressed.  A limited physical exam was performed with this format.  Please refer to the patient's chart for her consent to telehealth for [facility]   Date:  09/08/2018   ID:  Victoria Cardenas, DOB [birth year], MRN [id number]  Patient Location: Home Provider Location: Home  PCP:  Nolene Ebbs, MD  Cardiologist:  Ena Dawley, MD  Electrophysiologist:  None   Evaluation Performed:  Follow-Up Visit  Chief Complaint: Palpitations  History of Present Illness:    Victoria Cardenas is a 68 y.o. female with history of HTN, hyperlipidemia, glaucoma, mild MR followed for hypertension and palpitations. She's previously followed for history of abnormal stress test. Reportedly while in [place] in the past she underwent an exercise treadmill stress test that was positive for 1 mm ST depressions however follow-up exercise nuclear stress test showed no ischemia and no prior scar. There is a report scanned in from 2013 indicating normal imaging, LVEF >65%, "during treadmill exercise and Adenosine infusion, the patient developed dyspnea, rare PVCs and positive ST segment response without chest pain." It sounds like this was managed conservatively. 2D echo 2016 showed EF 60-65%, mild aortic sclerosis, mild MR.  H/o LE edema and palpitations with amlodipine.  08/04/2018 - 9 months follow up, she has developed URI 2 weeks ago and was given allergy pills by her PCP, however she has not started ar her symptoms  have resolved. She has developed palpitations associated with dizziness since then. They are starting and terminating all of a sudden, happening several times a day and lasting up to 10 minutes. No syncope. No fever, chills, no chest pain, no LE edema.  09/08/2018 - 1 month follow up, she was found to have hyperthyroidism, and was started on methimazole and propranolol by an endocrinologist. She couldn't tolerate methimazole as she developed diarrhea and upset stomach. Propranolol caused bradycardia and the dose was decreased to 10 mg PO BID. She continues to have chills and palpitations, some dizziness but no presyncope or syncope. She continues to have chills. No weight loss, good appetite, her BP has bee elevated.  The patient does not have symptoms concerning for COVID-19 infection (fever, chills, cough, or new shortness of breath).   Past Medical History:  Diagnosis Date  . Breast cancer ([place]) 2005   right breast  . Glaucoma   . Hypercholesteremia   . Hypertension    Past Surgical History:  Procedure Laterality Date  . ABDOMINAL HYSTERECTOMY    . BREAST EXCISIONAL BIOPSY Left pt unsure   . 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 aspirin 81 MG chewable tablet Chew 81-162 mg by mouth as needed (for headaches, discomfort, or intermittent chest pains).  . calcium-vitamin D (OSCAL WITH D) 500-200 MG-UNIT tablet Take 1 tablet by mouth 2 (two) times a day.  Marland Kitchen LUMIGAN 0.01 % SOLN Place 1 drop into both eyes at bedtime.  . propranolol (INDERAL) 20 MG tablet Take 0.5 tablets (10 mg total)  by mouth 2 (two) times daily.    Allergies:   Amlodipine; Penicillins; Methimazole; Ciprofloxacin; and Simvastatin   Social History   Tobacco Use  . Smoking status: Never Smoker  . Smokeless tobacco: Never Used  Substance Use Topics  . Alcohol use: No  . Drug use: No     Family Hx: The patient's family history includes Breast cancer in her maternal aunt and maternal  grandmother; Breast cancer (age of onset: 98) in her mother; Hypertension in her mother.  ROS:   Please see the history of present illness.    All other systems reviewed and are negative.   Prior CV studies:   The following studies were reviewed today:   Labs/Other Tests and Data Reviewed:    EKG:  No ECG reviewed.  Recent Labs: 08/06/2018: ALT 16; BUN 9; Creatinine, Ser 0.76; Hemoglobin 14.2; Platelets 237; Potassium 4.1; Sodium 143; TSH 0.056   Recent Lipid Panel No results found for: CHOL, TRIG, HDL, CHOLHDL, LDLCALC, LDLDIRECT  Wt Readings from Last 3 Encounters:  09/08/18 125 lb (56.7 kg)  08/25/18 125 lb (56.7 kg)  08/12/18 125 lb 10.6 oz (57 kg)     Objective:    Vital Signs:  BP 140/81   Pulse 62   Ht 5\' 3"  (1.6 m)   Wt 125 lb (56.7 kg)   BMI 22.14 kg/m    VITAL SIGNS:  reviewed  ASSESSMENT & PLAN:    1. Palpitations - sec to hyperthyroidism, Zio patch monitor showed   Sinus tach cardia to sinus tachycardia.  1 episode of ventricular tachycardia for total of 9 beats with ventricular rate 170 bpm.  43 episodes of SVTs some of which seem to represent atrial fibrillation.   - she didn't tolerate propranolol, I will discontinue and start low dose metoprolol 12.5 mg PO BID  2. Hyperthyroidism  - I will follow endocrinology recommendations, she couldn't tolerate methimazole, she is quite symptomatic  3. Essential HTN - elevated, I will start metoprolol  4. Hyperlipidemia - being managed by PCP. It has normalized with diet, we will obtain records from primary care physician.   COVID-19 Education: The signs and symptoms of COVID-19 were discussed with the patient and how to seek care for testing (follow up with PCP or arrange E-visit).  The importance of social distancing was discussed today.  Time:   Today, I have spent 25 minutes with the patient with telehealth technology discussing the above problems.    Medication Adjustments/Labs and Tests Ordered:  Current medicines are reviewed at length with the patient today.  Concerns regarding medicines are outlined above.   Tests Ordered: No orders of the defined types were placed in this encounter.  Medication Changes: No orders of the defined types were placed in this encounter.  Disposition:  Follow up in 4 week(s)  Signed, Ena Dawley, MD  09/08/2018 1:08 PM    [facility]

## 2018-09-09 ENCOUNTER — Telehealth: Payer: Self-pay | Admitting: *Deleted

## 2018-09-09 NOTE — Telephone Encounter (Addendum)
Patient called the refill request line on 09/08/18. She stated that the doctor had given her a different type medication. Patient also, stated "She told me that it would be a lower dosage."  I viewed the note from 5/13. Dr. [name] discontinued  Propanolol 20 mg, and changed to Metoprolol 25 mg tablets, taking 1/2 tablet twice a day. Patient had a concern that the new medication is stronger because Metoprolol 25 mg and Propanolol was written for 20 mg. Patient was advised that based on how the medication is formulated dosage will read differently. Patient verbalized understanding. Patient is scheduled to have an upcoming virtual visit with Estell Harpin on 10/04/18 @ 10:30 am. She was advised to call the office with any other concerns.

## 2018-09-10 ENCOUNTER — Other Ambulatory Visit: Payer: Self-pay

## 2018-09-10 ENCOUNTER — Telehealth: Payer: Self-pay | Admitting: Cardiology

## 2018-09-10 ENCOUNTER — Ambulatory Visit (INDEPENDENT_AMBULATORY_CARE_PROVIDER_SITE_OTHER): Payer: Medicare Other | Admitting: Internal Medicine

## 2018-09-10 ENCOUNTER — Encounter: Payer: Self-pay | Admitting: Internal Medicine

## 2018-09-10 VITALS — BP 122/78 | HR 70 | Temp 97.9°F | Ht 63.0 in | Wt 124.4 lb

## 2018-09-10 DIAGNOSIS — E0590 Thyrotoxicosis, unspecified without thyrotoxic crisis or storm: Secondary | ICD-10-CM

## 2018-09-10 LAB — T4, FREE: Free T4: 1.29 ng/dL (ref 0.60–1.60)

## 2018-09-10 LAB — TSH: TSH: 0.04 u[IU]/mL — ABNORMAL LOW (ref 0.35–4.50)

## 2018-09-10 NOTE — Telephone Encounter (Signed)
I spoke to the patient who called because since starting her Metoprolol Tartrate 12.5 mg bid on Wednesday, she has been feeling SOB, heaviness in chest and light headed.  She did not take her morning dose of Metoprolol and her BP was 132/66.    She feels ok now and I told her to monitor her BP over the weekend after taking her Metoprolol and any symptoms.  I told her that if symptoms worsen over the weekend, that she needs to go to the ED.

## 2018-09-10 NOTE — Telephone Encounter (Signed)
Follow up: ° ° ° °Patient returning your call back. Please call patient. °

## 2018-09-10 NOTE — Progress Notes (Signed)
Name: Victoria Cardenas  MRN/ DOB: [id number], Jun 19, 1950    Age/ Sex: 68 y.o., female     PCP: Nolene Ebbs, MD   Reason for Endocrinology Evaluation: Subclinical hyperthyroidism     Initial Endocrinology Clinic Visit: 08/25/2018    PATIENT IDENTIFIER: [name] is a 68 y.o., female with a past medical history of HTN, Hx of breast cancer (S/P lumpectomy ) and Osteoporosis   . She has followed with [facility] since 08/25/2018 for consultative assistance with management of her Subclinical hyperthyroidism.   HISTORICAL SUMMARY:  Pt was noted with low TSH in 11/2016, at the time she presented to the ED for palpitations.   In April, 2020 she presented again with palpitations and sob and was found to have a TSH of 0.056 uIU/mL with normal T3 and T4.    No FH of thyroid disease.    Osteoporosis : She was diagnosed with osteoporosis 2017, and was started on boniva at the time, but pt stopped it due to palpitations and diarrhea ~ 2 yrs ago.  Currently she is only on Calcium and Vitamin D 2 tablets daily    SUBJECTIVE:   During last visit (08/06/2018): We started her on Methimazole 10 mg daily   Today (09/10/2018):  Ms. Balducci is here for a short follow up visit to further discuss treatment options for subclinical hyperthyroidism. On her last visit I prescribed methimazole but she called our office due to concerns of sore that she has had for quite sometime prior to her visit. She was encouraged to try it, but called after the second day with c/o diarrhea, dizziness and palpitations, so she was advised to stop it.   Today she is c/o dizziness, intermittent palpitations, and sob. Diarrhea has resolved. She attributes her recently symptoms to metoprolol but she is not sure.    ROS:  As per HPI.   HISTORY:  Past Medical History:  Past Medical History:  Diagnosis Date  . Breast cancer ([place]) 2005   right breast  . Glaucoma   . Hypercholesteremia   .  Hypertension    Past Surgical History:  Past Surgical History:  Procedure Laterality Date  . ABDOMINAL HYSTERECTOMY    . BREAST EXCISIONAL BIOPSY Left pt unsure   . 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 She has never used smokeless tobacco. She reports that she does not drink alcohol or use drugs. Family History:  Family History  Problem Relation Age of Onset  . Hypertension Mother   . Breast cancer Mother 39  . Breast cancer Maternal Aunt   . Breast cancer Maternal Grandmother      HOME MEDICATIONS: Allergies as of 09/10/2018      Reactions   Amlodipine Swelling, Palpitations   Swelling, heart pounding   Penicillins Hives   Has patient had a PCN reaction causing immediate rash, facial/tongue/throat swelling, SOB or lightheadedness with hypotension: yes Has patient had a PCN reaction causing severe rash involving mucus membranes or skin necrosis: no Has patient had a PCN reaction that required hospitalization: no Has patient had a PCN reaction occurring within the last 10 years: no If all of the above answers are "NO", then may proceed with Cephalosporin   Methimazole Other (See Comments)   Pt report causes diarrhea and severe GI issues   Propranolol Other (See Comments)   Pt reports causes flushing    Ciprofloxacin Palpitations   Simvastatin  Other (See Comments)   Increased urination      Medication List       Accurate as of Sep 10, 2018  1:02 PM. If you have any questions, ask your nurse or doctor.        aspirin 81 MG chewable tablet Chew 81-162 mg by mouth as needed (for headaches, discomfort, or intermittent chest pains).   calcium-vitamin D 500-200 MG-UNIT tablet Commonly known as:  OSCAL WITH D Take 1 tablet by mouth 2 (two) times a day.   Lumigan 0.01 % Soln Generic drug:  bimatoprost Place 1 drop into both eyes at bedtime.   metoprolol tartrate 25 MG tablet Commonly known as:  LOPRESSOR Take 0.5  tablets (12.5 mg total) by mouth 2 (two) times daily.         OBJECTIVE:   PHYSICAL EXAM: VS: BP 122/78 (BP Location: Right Arm, Patient Position: Sitting, Cuff Size: Normal)   Pulse 70   Temp 97.9 F (36.6 C)   Ht 5\' 3"  (1.6 m)   Wt 124 lb 6.4 oz (56.4 kg)   SpO2 98%   BMI 22.04 kg/m    EXAM: General: Pt appears well and is in NAD  Neck: General: Supple without adenopathy. Thyroid: Thyroid size normal.  No goiter or nodules appreciated. No thyroid bruit.  Lungs: Clear with good BS bilat with no rales, rhonchi, or wheezes  Heart: Auscultation: RRR.  Abdomen: Normoactive bowel sounds, soft, nontender, without masses or organomegaly palpable  Extremities:  BL LE: No pretibial edema normal ROM and strength.  Skin: Hair: Texture and amount normal with gender appropriate distribution Skin Inspection: No rashes Skin Palpation: Skin temperature, texture, and thickness normal to palpation  Mental Status: Judgment, insight: Intact Orientation: Oriented to time, place, and person Mood and affect: No depression, anxiety, or agitation     DATA REVIEWED: Results for TAMI, BLASS (MRN [id number]) as of 09/10/2018 16:25  Ref. Range 04/02/2017 09:08 08/06/2018 10:42 09/10/2018 10:54  TSH Latest Ref Range: 0.35 - 4.50 uIU/mL 0.356 (L) 0.056 (L) 0.04 (L)  Triiodothyronine,Free,Serum Latest Ref Range: 2.0 - 4.4 pg/mL 3.4 4.3   T4,Free(Direct) Latest Ref Range: 0.60 - 1.60 ng/dL 1.24 1.68 1.29      ASSESSMENT / PLAN / RECOMMENDATIONS:   Subclinical Hyperthyroidism  - We again discussed the risk of subclinical hyperthyroidism and its association with an increased risk of atrial fibrillation, mortality and, primarily in postmenopausal women, a decrease in bone mineral density. - We again discussed different thionamide therapy including the side effects , we also discussed RAI ablation, I explained to her that with RAI ablation she may end up on LT-4 replacement for life.  - We will  obtain TRAB levels for graves' disease but autonomous nodules (s) is a likely cause in this case.  - We discussed that her TSH has been worsening over time and this could be responsible for all her symptoms rather then the medications. - We reviewed symptoms of hyperthyroidism  - Pt would like to give methimazole another try    Medications  Methimazole 10 mg daily     2. Multinodular Goiter   - No local neck symptoms  - This could be the main reason for subclinical hyperthyroidism  - No further intervention at this time without performing thyroid uptake and scan first.  - She is S/P benign FNA of the right mid nodule in 2018 , other sub-centimeter nodules present on ultrasound bilaterally but did not require further follow up.  3. Osteoporosis :  She has declined anti-resorptive therapy   Addendum: lab results discussed with the patient on 09/10/2018 at 1630   Signed electronically by: Mack Guise, MD  [facility]  [facility] [address] [place], [place] [address] Phone: [phone] FAX: [phone]      CC: Nolene Ebbs, [place] [address] Phone: [phone]  Fax: [phone]   Return to Endocrinology clinic as below: Future Appointments  Date Time Provider [place]  09/22/2018 10:00 AM [facility] None  10/04/2018 10:30 AM Charlie Pitter, PA-C [facility]  10/20/2018 10:10 AM Denise Washburn, Melanie Crazier, MD [facility] None  11/08/2018  9:20 AM Dorothy Spark, MD [facility]

## 2018-09-10 NOTE — Telephone Encounter (Signed)
I spoke with pt briefly. She reports she is currently at a doctor's appointment and will call us back

## 2018-09-10 NOTE — Telephone Encounter (Signed)
New message   Pt c/o medication issue:  1. Name of Medication: metoprolol tartrate (LOPRESSOR) 25 MG tablet  2. How are you currently taking this medication (dosage and times per day)? 1/2 tablet twice daily  3. Are you having a reaction (difficulty breathing--STAT)? No   4. What is your medication issue? Patient is having sob, lightheadeness, heavy feeling in chest. Patient believes this may be coming from this medication. Please advise.

## 2018-09-14 ENCOUNTER — Encounter: Payer: Self-pay | Admitting: Internal Medicine

## 2018-09-14 LAB — TRAB (TSH RECEPTOR BINDING ANTIBODY): TRAB: 3.34 IU/L — ABNORMAL HIGH (ref <=2.00)

## 2018-09-14 NOTE — Telephone Encounter (Signed)
Dr [name] reviewed this encounter and no new changes were made.

## 2018-09-20 ENCOUNTER — Emergency Department (HOSPITAL_COMMUNITY)
Admission: EM | Admit: 2018-09-20 | Discharge: 2018-09-20 | Disposition: A | Discharge status: Home / Self Care | Payer: Medicare Other | Attending: Emergency Medicine | Admitting: Emergency Medicine

## 2018-09-20 ENCOUNTER — Emergency Department (HOSPITAL_COMMUNITY): Payer: Medicare Other

## 2018-09-20 ENCOUNTER — Ambulatory Visit (INDEPENDENT_AMBULATORY_CARE_PROVIDER_SITE_OTHER)
Admission: EM | Admit: 2018-09-20 | Discharge: 2018-09-20 | Disposition: A | Discharge status: Home / Self Care | Payer: Medicare Other | Source: Home / Self Care

## 2018-09-20 ENCOUNTER — Encounter (HOSPITAL_COMMUNITY): Payer: Self-pay | Admitting: *Deleted

## 2018-09-20 ENCOUNTER — Encounter (HOSPITAL_COMMUNITY): Payer: Self-pay

## 2018-09-20 ENCOUNTER — Other Ambulatory Visit: Payer: Self-pay

## 2018-09-20 DIAGNOSIS — Z853 Personal history of malignant neoplasm of breast: Secondary | ICD-10-CM | POA: Insufficient documentation

## 2018-09-20 DIAGNOSIS — R079 Chest pain, unspecified: Secondary | ICD-10-CM | POA: Insufficient documentation

## 2018-09-20 DIAGNOSIS — I160 Hypertensive urgency: Secondary | ICD-10-CM | POA: Diagnosis not present

## 2018-09-20 DIAGNOSIS — R42 Dizziness and giddiness: Secondary | ICD-10-CM | POA: Diagnosis not present

## 2018-09-20 DIAGNOSIS — Z79899 Other long term (current) drug therapy: Secondary | ICD-10-CM | POA: Diagnosis not present

## 2018-09-20 DIAGNOSIS — I10 Essential (primary) hypertension: Secondary | ICD-10-CM

## 2018-09-20 DIAGNOSIS — R519 Headache, unspecified: Secondary | ICD-10-CM

## 2018-09-20 DIAGNOSIS — R51 Headache: Secondary | ICD-10-CM | POA: Diagnosis not present

## 2018-09-20 HISTORY — DX: Disorder of thyroid, unspecified: E07.9

## 2018-09-20 LAB — SEDIMENTATION RATE: Sed Rate: 7 mm/hr (ref 0–22)

## 2018-09-20 LAB — BASIC METABOLIC PANEL
Anion gap: 12 (ref 5–15)
BUN: 7 mg/dL — ABNORMAL LOW (ref 8–23)
CO2: 23 mmol/L (ref 22–32)
Calcium: 9.2 mg/dL (ref 8.9–10.3)
Chloride: 104 mmol/L (ref 98–111)
Creatinine, Ser: 0.76 mg/dL (ref 0.44–1.00)
GFR calc Af Amer: >60 mL/min (ref >60)
GFR calc non Af Amer: >60 mL/min (ref >60)
Glucose, Bld: 106 mg/dL — ABNORMAL HIGH (ref 70–99)
Potassium: 3.5 mmol/L (ref 3.5–5.1)
Sodium: 139 mmol/L (ref 135–145)

## 2018-09-20 LAB — CBC
HCT: 44.1 % (ref 36.0–46.0)
Hemoglobin: 14.7 g/dL (ref 12.0–15.0)
MCH: 30.7 pg (ref 26.0–34.0)
MCHC: 33.3 g/dL (ref 30.0–36.0)
MCV: 92.1 fL (ref 80.0–100.0)
Platelets: 219 10*3/uL (ref 150–400)
RBC: 4.79 MIL/uL (ref 3.87–5.11)
RDW: 11.7 % (ref 11.5–15.5)
WBC: 6 10*3/uL (ref 4.0–10.5)
nRBC: 0 % (ref 0.0–0.2)

## 2018-09-20 LAB — TROPONIN I: Troponin I: <0.03 ng/mL (ref <0.03)

## 2018-09-20 MED ORDER — SODIUM CHLORIDE 0.9% FLUSH
3.0000 mL | Freq: Once | INTRAVENOUS | Status: AC
  Administered 2018-09-20: 3 mL via INTRAVENOUS

## 2018-09-20 MED ORDER — SODIUM CHLORIDE 0.9 % IV BOLUS
500.0000 mL | Freq: Once | INTRAVENOUS | Status: AC
  Administered 2018-09-20: 21:00:00 500 mL via INTRAVENOUS

## 2018-09-20 NOTE — Discharge Instructions (Addendum)
If you develop a severe headache, vomiting, chest pain, trouble breathing, blurry vision, weakness or numbness, or any other new/concerning symptoms then return to the ER for evaluation.

## 2018-09-20 NOTE — ED Notes (Addendum)
Patient states that she started a new medicine in April and it has been causing swelling, HA, broken blood vessels in her eyes, runny nose, cough and congestion.Her provider told her to stop taking meds (which she did until the 15th when she had her follow up appt) and now re-takes.  Patient was told that her Thyroid was "off". So, she stopped taking the medication again last night and her BP is elevated today.

## 2018-09-20 NOTE — ED Provider Notes (Signed)
[facility] EMERGENCY DEPARTMENT Provider Note   CSN: [id number] Arrival date & time: 09/20/18  1704    History   Chief Complaint Chief Complaint  Patient presents with  . Hypertension    HPI Victoria Cardenas is a 68 y.o. female.     HPI  68 year old female presents with a chief complaint of lightheadedness and chest pain.  She states she has been getting headaches on and off ever since she has been changing her blood pressure medicines.  She seen her cardiologist and then endocrinologist.  She is also being treated for overactive thyroid.  She keeps going on and off different blood pressure medicines, what appears to be metoprolol in the notes, and she gets side effects that she attributes to this including cough and sore throat.  She also gets on and off headache.  Last time she had a headache was yesterday where she states it kind of gradually worsened.  Started on the left temple.  Seems to be gone today.  Today she is felt lightheaded and checked her blood pressure and it was elevated.  She did not take her meds this morning as she was afraid of side effects.  Currently she feels okay except for a little lightheaded.  There is no chest pain or shortness of breath.  Past Medical History:  Diagnosis Date  . Breast cancer ([place]) 2005   right breast  . Glaucoma   . Hypercholesteremia   . Hypertension   . Thyroid disease     Patient Active Problem List   Diagnosis Date Noted  . Heart palpitations 12/23/2016  . Essential hypertension 12/23/2016  . Hyperlipidemia 12/23/2016  . Hyperglycemia 12/23/2016  . Eustachian tube dysfunction, right 02/27/2016  . Ear pressure, right 02/06/2016  . 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 ABDOMINAL HYSTERECTOMY    . BREAST EXCISIONAL BIOPSY Left pt unsure   . 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       Home Medications    Prior to Admission medications   Medication Sig Start Date End Date Taking? Authorizing Provider  aspirin 81 MG chewable tablet Chew 81-162 mg by mouth as needed (for headaches, discomfort, or intermittent chest pains).    [provider]  calcium-vitamin D (OSCAL WITH D) 500-200 MG-UNIT tablet Take 1 tablet by mouth 2 (two) times a day.    [provider]  LUMIGAN 0.01 % SOLN Place 1 drop into both eyes at bedtime. 05/14/18   [provider]  metoprolol tartrate (LOPRESSOR) 25 MG tablet Take 0.5 tablets (12.5 mg total) by mouth 2 (two) times daily. 09/08/18   Dorothy Spark, MD    Family History Family History  Problem Relation Age of Onset  . Hypertension Mother   . Breast cancer Mother 68  . Breast cancer Maternal Aunt   . Breast cancer Maternal Grandmother     Social History Social History   Tobacco Use  . Smoking status: Never Smoker  . Smokeless tobacco: Never Used  Substance Use Topics  . Alcohol use: No  . Drug use: No     Allergies   Amlodipine; Penicillins; Methimazole; Propranolol; Ciprofloxacin; and Simvastatin   Review of Systems Review of Systems  Respiratory: Negative for shortness of breath.   Cardiovascular: Negative for chest pain.  Neurological: Positive for light-headedness and headaches. Negative for  dizziness.  All other systems reviewed and are negative.    Physical Exam Updated Vital Signs BP (!) 166/87   Pulse 61   Temp 98.5 F (36.9 C) (Oral)   Resp 16   Ht _0  (1.6 m)   Wt 56.7 kg   SpO2 100%   BMI 22.14 kg/m   Physical Exam Vitals signs and nursing note reviewed.  Constitutional:      Appearance: She is well-developed.  HENT:     Head: Normocephalic and atraumatic.     Comments: No tenderness over left temple    Right Ear: External ear normal.     Left Ear: External ear normal.     Nose: Nose normal.  Eyes:     General:        Right eye: No discharge.         Left eye: No discharge.     Extraocular Movements: Extraocular movements intact.     Pupils: Pupils are equal, round, and reactive to light.  Cardiovascular:     Rate and Rhythm: Regular rhythm. Bradycardia present.     Heart sounds: Normal heart sounds.  Pulmonary:     Effort: Pulmonary effort is normal.     Breath sounds: Normal breath sounds.  Abdominal:     Palpations: Abdomen is soft.     Tenderness: There is no abdominal tenderness.  Skin:    General: Skin is warm and dry.  Neurological:     Mental Status: She is alert.     Comments: CN 3-12 grossly intact. 5/5 strength in all 4 extremities. Grossly normal sensation. Normal finger to nose.   Psychiatric:        Mood and Affect: Mood is not anxious.      ED Treatments / Results  Labs (all labs ordered are listed, but only abnormal results are displayed) Labs Reviewed  BASIC METABOLIC PANEL - Abnormal; Notable for the following components:      Result Value   Glucose, Bld 106 (*)    BUN 7 (*)    All other components within normal limits  CBC  TROPONIN I  SEDIMENTATION RATE    EKG EKG Interpretation  Date/Time:  Monday Sep 20 2018 17:37:59 EDT Ventricular Rate:  59 PR Interval:  102 QRS Duration: 82 QT Interval:  432 QTC Calculation: 427 R Axis:   56 Text Interpretation:  Sinus bradycardia with short PR Possible Left atrial enlargement Borderline ECG no acute ST/T changes no significant change since Feb 2020 Confirmed by Sherwood Gambler [phone]) on 09/20/2018 8:31:54 PM   Radiology Dg Chest 2 View  Result Date: 09/20/2018 CLINICAL DATA:  Loss of appetite and dizziness. EXAM: CHEST - 2 VIEW COMPARISON:  12/18/2016 FINDINGS: Lungs are hyperexpanded. The lungs are clear without focal pneumonia, edema, pneumothorax or pleural effusion. Cardiopericardial silhouette is at upper limits of normal for size. The visualized bony structures of the thorax are intact. IMPRESSION: No active cardiopulmonary disease.  Electronically Signed   By: Misty Stanley M.D.   On: 09/20/2018 18:33    Procedures Procedures (including critical care time)  Medications Ordered in ED Medications  sodium chloride flush (NS) 0.9 % injection 3 mL (3 mLs Intravenous Given 09/20/18 2050)  sodium chloride 0.9 % bolus 500 mL (0 mLs Intravenous Stopped 09/20/18 2230)     Initial Impression / Assessment and Plan / ED Course  I have reviewed the triage vital signs and the nursing notes.  Pertinent labs & imaging results that were available  during my care of the patient were reviewed by me and considered in my medical decision making (see chart for details).        Has minimal symptoms including only lightheadedness now.  Given the on and off temple headache, ESR sent given her age.  This is negative.  Labs, ECG, troponin obtained from triage are benign.  Chest x-ray unremarkable.  She does not have any anginal symptoms.  Her blood pressure is slowly improved without treatment in the ED.  Unclear if the headache is from her previous meds versus the hypertension but I doubt an acute bleed or stroke.  I have discussed she should call her cardiologist who manages her blood pressure tomorrow for outpatient medication changes.  We discussed return precautions.  Final Clinical Impressions(s) / ED Diagnoses   Final diagnoses:  Hypertensive urgency    ED Discharge Orders    None       Sherwood Gambler, MD 09/20/18 2242

## 2018-09-20 NOTE — Discharge Instructions (Addendum)
Go to the ED for evaluation

## 2018-09-20 NOTE — ED Triage Notes (Signed)
Pt presents with headache, loss of appetite, and dizziness after a change in her medication for her thyroid & blood pressure.

## 2018-09-20 NOTE — ED Provider Notes (Signed)
Victoria Cardenas    CSN: [id number] Arrival date & time: 09/20/18  1532     History   Chief Complaint Chief Complaint  Patient presents with  . Headache  . Dizziness    HPI Victoria Cardenas is a 68 y.o. female.   The history is provided by the patient. No language interpreter was used.  Headache  Pain location:  Generalized Quality:  Dull Radiates to:  Does not radiate Severity currently:  Unable to specify Duration:  1 day Timing:  Constant Progression:  Worsening Chronicity:  New Relieved by:  Nothing Worsened by:  Nothing Ineffective treatments:  None tried Associated symptoms: dizziness   Dizziness  Associated symptoms: headaches     Past Medical History:  Diagnosis Date  . Breast cancer ([place]) 2005   right breast  . Glaucoma   . Hypercholesteremia   . Hypertension   . Thyroid disease     Patient Active Problem List   Diagnosis Date Noted  . Heart palpitations 12/23/2016  . Essential hypertension 12/23/2016  . Hyperlipidemia 12/23/2016  . Hyperglycemia 12/23/2016  . Eustachian tube dysfunction, right 02/27/2016  . Ear pressure, right 02/06/2016  . 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 ABDOMINAL HYSTERECTOMY    . BREAST EXCISIONAL BIOPSY Left pt unsure   . 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      Home Medications    Prior to Admission medications   Medication Sig Start Date End Date Taking? Authorizing Provider  aspirin 81 MG chewable tablet Chew 81-162 mg by mouth as needed (for headaches, discomfort, or intermittent chest pains).    [provider]  calcium-vitamin D (OSCAL WITH D) 500-200 MG-UNIT tablet Take 1 tablet by mouth 2 (two) times a day.    [provider]  LUMIGAN 0.01 % SOLN Place 1 drop into both eyes at bedtime. 05/14/18   [provider]  metoprolol tartrate  (LOPRESSOR) 25 MG tablet Take 0.5 tablets (12.5 mg total) by mouth 2 (two) times daily. 09/08/18   Dorothy Spark, MD    Family History Family History  Problem Relation Age of Onset  . Hypertension Mother   . Breast cancer Mother 15  . Breast cancer Maternal Aunt   . Breast cancer Maternal Grandmother     Social History Social History   Tobacco Use  . Smoking status: Never Smoker  . Smokeless tobacco: Never Used  Substance Use Topics  . Alcohol use: No  . Drug use: No     Allergies   Amlodipine; Penicillins; Methimazole; Propranolol; Ciprofloxacin; and Simvastatin   Review of Systems Review of Systems  Neurological: Positive for dizziness and headaches.  All other systems reviewed and are negative.    Physical Exam Triage Vital Signs ED Triage Vitals  Enc Vitals Group     BP 09/20/18 1559 (!) 192/75     Pulse Rate 09/20/18 1559 78     Resp 09/20/18 1559 17     Temp 09/20/18 1559 98.5 F (36.9 C)     Temp Source 09/20/18 1559 Oral     SpO2 09/20/18 1559 99 %     Weight --      Height --      Head Circumference --      Peak Flow --      Pain Score 09/20/18 1600 6  Pain Loc --      Pain Edu? --      Excl. in [place]? --    No data found.  Updated Vital Signs BP (!) 192/75 (BP Location: Right Arm)   Pulse 78   Temp 98.5 F (36.9 C) (Oral)   Resp 17   SpO2 99%   Visual Acuity Right Eye Distance:   Left Eye Distance:   Bilateral Distance:    Right Eye Near:   Left Eye Near:    Bilateral Near:     Physical Exam Vitals signs and nursing note reviewed.  Constitutional:      Appearance: She is well-developed.  HENT:     Head: Normocephalic.  Eyes:     Extraocular Movements: Extraocular movements intact.  Neck:     Musculoskeletal: Normal range of motion.  Cardiovascular:     Rate and Rhythm: Normal rate.     Heart sounds: Normal heart sounds.  Pulmonary:     Effort: Pulmonary effort is normal.  Abdominal:     General: There is no  distension.     Palpations: Abdomen is soft.  Musculoskeletal: Normal range of motion.  Neurological:     Mental Status: She is alert and oriented to person, place, and time.  Psychiatric:        Mood and Affect: Mood normal.      UC Treatments / Results  Labs (all labs ordered are listed, but only abnormal results are displayed) Labs Reviewed - No data to display  EKG None  Radiology No results found.  Procedures Procedures (including critical care time)  Medications Ordered in UC Medications - No data to display  Initial Impression / Assessment and Plan / UC Course  I have reviewed the triage vital signs and the nursing notes.  Pertinent labs & imaging results that were available during my care of the patient were reviewed by me and considered in my medical decision making (see chart for details).      Final Clinical Impressions(s) / UC Diagnoses   Final diagnoses:  Dizziness  Hypertension, unspecified type  Nonintractable headache, unspecified chronicity pattern, unspecified headache type   Discharge Instructions   None    ED Prescriptions    None     Controlled Substance Prescriptions [place] Controlled Substance Registry consulted? Not Applicable   Fransico Meadow, [place] 09/20/18 6578

## 2018-09-20 NOTE — ED Triage Notes (Signed)
Pt reports a change in BP meds  And dose. Pt woke with a HA this morning.

## 2018-09-21 ENCOUNTER — Telehealth: Payer: Self-pay | Admitting: Cardiology

## 2018-09-21 MED ORDER — LOSARTAN POTASSIUM 25 MG PO TABS: 25.0000 mg | ORAL_TABLET | Freq: Every day | ORAL | 3 refills | Status: DC

## 2018-09-21 NOTE — Telephone Encounter (Signed)
New message:     Patient calling concerning that she had a medication change and states she was doing well until Sunday, she had a really bad headache and went to the ER and states her BP was over 200. Please call patient back concering this matter.

## 2018-09-21 NOTE — Telephone Encounter (Signed)
Pt states when she reported to Urgent care yesterday with complaints of headache, dizziness, and feeling lightheaded. Pt states her BP at Urgent care was 192/75 and  HR 78. Pt states Urgent care  then transferred her to the ER for hypertensive urgency.  Pt states the ER did several test on her, labs, scans, ekgs, and all come back within acceptable limits, so she was discharged with instructions for her to call her Cardiologist and her Endocrinologist, to further advise on this issue. Pt states that she felt like she was doing pretty well on metoprolol tartrate 12.5 mg po bid.  Pt states the ER MD seemed to think that her methimazole and her metoprolol are not agreeing with each other. Pt is requesting that Dr [name] review her ER note and test and advise on what she should do from here.  Her BP this morning with no meds taken is 139/78 and 68 HR.  She would like for you to review the ER note from this weekend and further advise, or give your recommendations, on what she should do about this issue. Informed the pt that I would forward this message to Dr [name] to review and advise on, and  follow-up with her shortly thereafter, once recommendations are received. Pt verbalized understanding and agrees with this plan.

## 2018-09-21 NOTE — Telephone Encounter (Signed)
Please start her on losartan 25 mg po daily in addition to metoprolol 12.5 mg po BID and methimazole. I have called her and she agrees.

## 2018-09-21 NOTE — Telephone Encounter (Signed)
Medications updated in her med list and losartan 25 mg po daily was sent to her pharmacy on file for a month supply to make sure she tolerates this appropriately or not. Dr [name] already spoke with the pt over the phone to endorse to her that she needs to start new med losartan 25 mg po daily and continue taking her regimen of metoprolol 12.5 mg po bid as well as her prescribed methimazole her Endocrinologist prescribed to her.  Pt agreed to Dr Francesca Oman plan for these medication recommendations.

## 2018-09-22 ENCOUNTER — Other Ambulatory Visit: Payer: Self-pay

## 2018-09-22 ENCOUNTER — Other Ambulatory Visit (INDEPENDENT_AMBULATORY_CARE_PROVIDER_SITE_OTHER): Payer: Medicare Other

## 2018-09-22 DIAGNOSIS — M810 Age-related osteoporosis without current pathological fracture: Secondary | ICD-10-CM

## 2018-09-22 DIAGNOSIS — E0590 Thyrotoxicosis, unspecified without thyrotoxic crisis or storm: Secondary | ICD-10-CM | POA: Diagnosis not present

## 2018-09-23 ENCOUNTER — Telehealth: Payer: Self-pay | Admitting: Internal Medicine

## 2018-09-23 LAB — VITAMIN D 25 HYDROXY (VIT D DEFICIENCY, FRACTURES): Vit D, 25-Hydroxy: 26 ng/mL — ABNORMAL LOW (ref 30–100)

## 2018-09-23 LAB — TSH: TSH: 0.27 mIU/L — ABNORMAL LOW (ref 0.40–4.50)

## 2018-09-23 LAB — T4, FREE: Free T4: 1.3 ng/dL (ref 0.8–1.8)

## 2018-09-23 LAB — T3: T3, Total: 100 ng/dL (ref 76–181)

## 2018-09-23 MED ORDER — DILTIAZEM HCL ER COATED BEADS 120 MG PO CP24: 120.0000 mg | ORAL_CAPSULE | Freq: Every day | ORAL | 1 refills | Status: DC

## 2018-09-23 NOTE — Telephone Encounter (Signed)
Patient is calling back today stating that her side effects are not getting any better. She feels like they are getting worse. See previous phone conversation from 09/21/18.  She would like to speak with the nurse.

## 2018-09-23 NOTE — Telephone Encounter (Signed)
No concerns with mixing up medications. Please let her know her thyroid is getting better, the methimazole is working.  Also, let her know vitamin D is on the low side, I suggest she starts over the counter vitamin D3 1000 iu daily    Thanks

## 2018-09-23 NOTE — Telephone Encounter (Signed)
Pt is calling in with similar complaints from the other day, associating this with the medication she is taking. Pt states she has a cough, sob, chest discomfort, and a headache, and associates this with her medications.  Pt states she has not taken her metoprolol this morning and never took her losartan Dr [name] advised her to take, for she states she is not going to take it, because back in 2018, her PCP took her off this med because it caused her complaints of frequent urination and a UTI.  Pt is taking her methimazole and has not stopped it, nor has she called her Endocrinologist to endorse if her issues could be coming from that med or not.  Pt states her BP today is 138/79 and her HR is 65.  Pt states she just ate breakfast and will take her thyroid med and her metoprolol, but she refuses to take losartan.  Pt would like for Dr [name] to further advise on why her symptoms are continually occurring.   Did advise the pt that she needs to touch base with her Endocrinologist, and ask if her methimazole could be causing any of her complaints.  Pt states she will do this. Informed the pt that I will route this message to Dr [name] to review and advise on, and follow-up with the pt thereafter.  Pt verbalized understanding and agrees with this plan.

## 2018-09-23 NOTE — Telephone Encounter (Signed)
Please advise 

## 2018-09-23 NOTE — Telephone Encounter (Signed)
Patient stated that she is taking methimazole (TAPAZOLE) 10 MG tablet and her other provider is concerned about mixing it with other medications she is taking.  Please Advise, Thanks

## 2018-09-23 NOTE — Telephone Encounter (Signed)
Called the pt and informed her that per Dr [name], she wants her to stop the metoprolol and losartan, and start taking cardizem CD 120 mg po daily, and continue taking methimazole.  Confirmed the pharmacy of choice with the pt.  Pt verbalized understanding and agrees with this plan.  Pt was instructed to call if she has any further issues.

## 2018-09-23 NOTE — Telephone Encounter (Signed)
I would discontinue metoprolol and losartan and start cardizem CD 120 mg po daily, continue methimazole

## 2018-09-23 NOTE — Addendum Note (Signed)
Addended by: [name] on: 09/23/2018 06:15 PM   Modules accepted: Orders

## 2018-09-24 ENCOUNTER — Telehealth: Payer: Self-pay | Admitting: *Deleted

## 2018-09-24 NOTE — Telephone Encounter (Signed)
The patient called this morning and said that the pharmacy is out of her medication, and will not be able to fill her rx until Saturday. She wants to know if it is OK to go without her other medications, or if she should continue taking the old meds until the new one is ready

## 2018-09-24 NOTE — Telephone Encounter (Signed)
Pt informed and aware if instructions

## 2018-09-24 NOTE — Telephone Encounter (Signed)
Victoria Cardenas       10:27 AM  Note    The patient called this morning and said that the pharmacy is out of her medication, and will not be able to fill her rx until Saturday. She wants to know if it is OK to go without her other medications, or if she should continue taking the old meds until the new one is ready

## 2018-09-24 NOTE — Telephone Encounter (Signed)
Spoke with the pt, she states they will not deliver her Cardizem CD Dr [name] ordered on her yesterday, until tomorrow.  Pt states she is feeling good today, and she will take her metoprolol if needed, and start the new regimen tomorrow, when they deliver the med.  Informed the pt that would be perfectly fine, or I can call her Cardizem CD into another pharmacy to fill today and she can start taking it. Pt states she wants the med to stay at the pharmacy it is at, for they will deliver it to her.  Advised the pt that would be fine, and she can start her regimen tomorrow.  Pt verbalized understanding and agrees with this plan.

## 2018-09-25 ENCOUNTER — Telehealth: Payer: Self-pay | Admitting: Physician Assistant

## 2018-09-25 NOTE — Telephone Encounter (Addendum)
Pt called and stated that her new prescription of cardizem won't be ready at the pharmacy until Monday. I offered to call it into a different pharmacy but she gets her scripts delivered. She will continue her present regimen today and tomorrow and switch when she receives her medications on Monday.   Tami Lin Alixander Rallis, PA-C 09/25/2018, 12:37 PM

## 2018-09-25 NOTE — Telephone Encounter (Signed)
error 

## 2018-09-27 DIAGNOSIS — J312 Chronic pharyngitis: Secondary | ICD-10-CM | POA: Insufficient documentation

## 2018-09-27 DIAGNOSIS — E0590 Thyrotoxicosis, unspecified without thyrotoxic crisis or storm: Secondary | ICD-10-CM | POA: Insufficient documentation

## 2018-09-29 ENCOUNTER — Telehealth: Payer: Self-pay | Admitting: Cardiology

## 2018-09-29 MED ORDER — LISINOPRIL 5 MG PO TABS: 5.0000 mg | ORAL_TABLET | Freq: Every day | ORAL | 0 refills | Status: DC

## 2018-09-29 NOTE — Telephone Encounter (Signed)
Notified the pt that per Dr [name], she really doesn't think all of the side effects are related to these drugs, and she strongly recommends that she continue her methimazole and follow-up with her Endocrinologist at the next available appt. Informed the pt that Dr [name] said we can d/c her cardizem CD (updated in allergies in her chart), and try her on lisinopril 5 mg po daily for BP management. Confirmed the pharmacy of choice with the pt. Informed her that I will only send in a 30 day supply of this med, to make sure she tolerates this appropriately. Advised the pt to call her Endocrinologist and advise for her appt to be moved up, if possible, for the pt states she has an appt with her Endocrinologist later this month. Pt states she will call their office today to see if this appt can be moved up. Pt verbalized understanding and agrees with this plan.

## 2018-09-29 NOTE — Telephone Encounter (Signed)
Pt is calling to report that new medicine cardizem CD 120 mg po daily Dr [name] started her on last week for BP and HR management, is causing her side effects.  Pt has had multiple intolerances to several different BP meds we have started her on.  Pt reports that when she takes her cardizem CD, she feels lightheaded, she has dizziness, and it caused her to have right sided sore throat and difficulty swallowing meals.  Pt states she stopped taking this med yesterday.  Pt reports her BP and HR this morning are:  126/73-65. Pt states she has always been super sensitive to medications.  Did note in pts chart that she was on low dose metoprolol 12.5 mg po daily, and she mentioned no complaints with this med.  When asking the pt how she tolerated metoprolol, she stated "it didn't do anything for my BP and I had to go to the ER a couple weeks when I was on that med, for high BP." Pt would like to be on a tolerable regimen and even mentioned being referred to BP clinic. Pt states she has to be on something, before her numbers go back up.  Pt is asymptomatic today with normal V/S. Informed the pt that I will route this message to Dr [name] to review and advise on, and follow-up with the pt thereafter. Pt verbalized understanding and agrees with this plan.

## 2018-09-29 NOTE — Telephone Encounter (Signed)
I really dont think that all of the side effects are related to these drugs, I would strongly recommend to continue methimazole and follow up with her endocrinologist. I would try lisinopril 5 mg po daily for her BP.

## 2018-09-29 NOTE — Telephone Encounter (Signed)
New Message     Pt c/o medication issue:  1. Name of Medication: diltiazem   2. How are you currently taking this medication (dosage and times per day)? 120 mg 1x daily   3. Are you having a reaction (difficulty breathing--STAT)? Yes, she feels dizzy and lightheaded. Also having trouble swallowing   4. What is your medication issue? Pt is calling and says she is having a reaction to the medication    Please call

## 2018-09-30 ENCOUNTER — Telehealth: Payer: Self-pay | Admitting: Cardiology

## 2018-09-30 MED ORDER — HYDRALAZINE HCL 25 MG PO TABS: 25.0000 mg | ORAL_TABLET | Freq: Three times a day (TID) | ORAL | 1 refills | Status: DC

## 2018-09-30 NOTE — Telephone Encounter (Signed)
New Message     Pt c/o medication issue:  1. Name of Medication: Lisinopril   2. How are you currently taking this medication (dosage and times per day)? 5mg  1 x daily, has not started yet   3. Are you having a reaction (difficulty breathing--STAT)? No   4. What is your medication issue? Pt says she picked up the new prescription today and it says it can cause swelling and can be dangerous for black pt's and to be at a higher risk. She would like to discuss this medication

## 2018-09-30 NOTE — Telephone Encounter (Signed)
Talked to the and informed her that we will discontinue her lisinopril and try her on hydralazine 25 mg po TID.  Endorsed to the pt that I will send a month supply of this med into her pharmacy of choice, to make sure she tolerates this appropriately. Pt verbalized understanding and agrees with this plan.

## 2018-09-30 NOTE — Telephone Encounter (Signed)
Dr. [name], this pt states she went and picked up her lisinopril you advised on yesterday, and she started reading on this medication prior to taking it. Pt states that side effects mentioned on this med is that it causes swelling, angioedema, and can be harmful to African Americans. Pt states she is already having issues with her throat, and she is not interested in taking this medication at all. Tried educating the pt on this med for over 10 mins, and she responded, "I am not taking this based on my findings." Informed the pt that I will route this message to Dr [name] to see if she has any further recommendations, and follow-up with the pt thereafter.  Pt verbalized understanding and agrees with this plan.

## 2018-09-30 NOTE — Telephone Encounter (Signed)
Lets try hydralazine 25 mg po tid

## 2018-10-01 ENCOUNTER — Encounter: Payer: Self-pay | Admitting: Physician Assistant

## 2018-10-01 NOTE — Progress Notes (Addendum)
Virtual Visit via Video Note   This visit type was conducted due to national recommendations for restrictions regarding the COVID-19 Pandemic (e.g. social distancing) in an effort to limit this patient's exposure and mitigate transmission in our community.  Due to her co-morbid illnesses, this patient is at least at moderate risk for complications without adequate follow up.  This format is felt to be most appropriate for this patient at this time.  All issues noted in this document were discussed and addressed.  A limited physical exam was performed with this format.  Please refer to the patient's chart for her consent to telehealth for [facility].   Date:  10/04/2018   ID:  Victoria Cardenas, DOB 31-May-1950, MRN [id number]  Patient Location: Home Provider Location: Home  PCP:  Nolene Ebbs, MD  Cardiologist:  Ena Dawley, MD  Electrophysiologist:  None   Evaluation Performed:  Follow-Up Visit  Chief Complaint:  Discuss numerous recent complaints/medication intolerance  History of Present Illness:    Victoria Cardenas is a 68 y.o. female with HTN, hyperlipidemia, glaucoma, mild MR, short PR, subclinical hyperthyroidism who presents to f/u medication changes. She's previously followed with Dr. [name] for history of abnormal stress test. Reportedly while in [place] in the past she underwent an exercise treadmill stress test that was positive for 1 mm ST depressions however follow-up exercise nuclear stress test showed no ischemia and no prior scar. There is a report scanned in from 2013 indicating normal imaging, LVEF >65%, "during treadmill exercise and Adenosine infusion, the patient developed dyspnea, rare PVCs and positive ST segment response without chest pain." It sounds like this was managed conservatively. 2D echo 2016 showed EF 60-65%, mild aortic sclerosis, mild MR.   In 2018 she was switched from valsartan onto amlodipine due to the recall. She deveoped increased edema and  sensation of heart pounding from the amlodipine. She was seen in the ER without specific abnormality. She discontinued amlodipine and felt much better.   She had done fairly well until f/u in 07/2018 when she reported palpitations associated with dizziness following a recent URI. Zio patch showed sinus tach, 1 episode of VT (9 beats), 43 episodes of SVT, some of which seemed to represent atrial fibrillation. Dr. [name] felt this was due to the finding of hyperthyroidism. Anticoagulation was not mentioned in cardiology notes. The patient was referred to endocrinology with recommendation to start methimazole. She temporarily stopped this due to side effects of diarrhea and upset stomach. She was trialed on propranolol but this was decreased due to bradycardia and reported flushing. She did not tolerate propanolol so she was changed to metoprolol. Phone notes outline complaints of cough, SOB, chest discomfort and headache which the patient attributed to her medicines. Dr. [name] recommended to discontinue losartan and metoprolol and start diltiazem. However, the patient then reported lightheadedness, dizziness and right sided sore throat pain which she attributed to diltiazem and requested alternative. Dr. [name] advised that she did not think side effects were related to medications and advised she f/u with endocrinologist. Lisinopril 5mg  daily was also recommended for BP but the patient was fearful over the possiblity of listed side effects so requested not to start this medicine. Instead, hydralazine was started. Last labs 08/2018 showed normal CBC, K 3.5, Cr 0.76. Per last endocrinology note, "We discussed that her TSH has been worsening over time and this could be responsible for all her symptoms rather then the medications." CHADSVASC is 2 for HTN, female. Recent TSH was  still 0.27 but slightly improved from prior which patient states she was reassured was good news.  She is seen by virtual video visit today.  She reports she feels like the hydralazine makes her feel a little off balance within 10 minutes of ingesting it. She also has developed an episodic dry cough which she attributes to the medicine. She seems stoic when discussing that she had reported similar side effects/symptoms from other medicines in the past as well. She denies any chest pain or pressure. She has intermittent mild SOB, stable over the last few months, without any provocative features. She does not own a car and walks places without any anginal symptoms or DOE. She acknowledges she has been staying in more and doing less. She has not had any pounding heart recently. She has not had any syncope. The patient does not have symptoms concerning for COVID-19 infection (fever, chills, cough, or new shortness of breath).   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   cholecalciferol (VITAMIN D3) 25 MCG (1000 UT) tablet Take 1,000 Units by mouth daily.   hydrALAZINE (APRESOLINE) 25 MG tablet Take 1 tablet (25 mg total) by mouth 3 (three) times daily.   LUMIGAN 0.01 % SOLN Place 1 drop into both eyes at bedtime.   methimazole (TAPAZOLE) 10 MG tablet Take 10 mg by mouth daily.     Allergies:   Amlodipine; Penicillins; Cardizem cd [diltiazem]; Lisinopril; Losartan; Propranolol; Ciprofloxacin; and Simvastatin    Social History   Tobacco Use   Smoking status: Never Smoker   Smokeless tobacco: Never Used  Substance Use Topics   Alcohol use: No   Drug use: No     Family Hx: The patient's family history includes Breast cancer in her maternal aunt and maternal grandmother; Breast cancer (age of onset: 67) in her mother; Hypertension in her mother.  ROS:   Please see the history of present illness.   All other systems reviewed and are negative.   Prior CV studies:   Most recent pertinent cardiac studies are outlined above.   Labs/Other Tests and Data Reviewed:    EKG:  An ECG dated 09/20/18 was personally reviewed today and demonstrated:  09/20/18 sinus bradycardia 59bpm with short PR, possible LAE, nonspecific STT changes  Recent Labs: 08/06/2018: ALT 16 09/20/2018: BUN 7; Creatinine, Ser 0.76; Hemoglobin 14.7; Platelets 219; Potassium 3.5; Sodium 139 09/22/2018: TSH 0.27   Recent Lipid Panel No results found for: CHOL, TRIG, HDL, CHOLHDL, LDLCALC, LDLDIRECT  Wt Readings from Last 3 Encounters:  10/04/18 125 lb (56.7 kg)  09/20/18 125 lb (56.7 kg)  09/10/18 124 lb 6.4 oz (56.4 kg)     Objective:    Vital Signs:  BP 106/72    Pulse 72    Ht 5\' 3"  (  1.6 m)    Wt 125 lb (56.7 kg)    BMI 22.14 kg/m    VITAL SIGNS:  reviewed  General - calm appearing AAF in no acute distress HEENT - NCAT, EOM intact Pulm - No labored breathing, no coughing during visit, no audible wheezing, speaking in full sentences Neuro - A+Ox3, no slurred speech, answers questions appropriately Psych - Stoic affect     ASSESSMENT & PLAN:    1. Intermittent unprovoked SOB for several months - unclear what this represents, question relationship to her intermittent arrhythmias. Her HR and BP are normal today during her examination and she denies any features of CHF including orthopnea or swelling. She has not had any risk factors to suggest DVT/PE and she is not tachycardic. CXR was negative for fluid  recently. Will arrange 2D echocardiogram ASAP given recent rhythm abnormalities and obtain labs when she comes in to get this done. ER precautions discussed. I remain concerned as Dr. [name] does, however, that instead of her medicines being the total culprit for all of her symptoms that perhaps there is an underlying cause. She denies any overt stressors but I wonder if her persistent subclinical hypothyroidism is exacerbating a type of anxiety that is accompanied by hyperawareness of perceived side effects. However, I also encouraged her to make an appointment with primary care to make sure that there is a diagnosis to unify her symptoms totally unrelated to her cardiac status or meds - I cannot really explain her dry cough, gait instability or recent sore throat by her cardiac meds and reiterated Dr. Francesca Oman concern about this as well. For now she will continue hydralazine as it is doing a good job of keeping her blood pressure well controlled, and await EP input regarding the next action items. 2. SVT vs atrial fib - it concerns me that she has had SVT and NSVT in the context of a short PR interval. I would like to get EP input before changing meds again. I will also ask for Dr. Francesca Oman input on this plan as well as whether she had considered need for anticoagulation as she reported event monitor showed possible afib. Addendum: Dr. [name] agrees with plan. She states she would not start anticoagulation at this time as she episodes were short (also concern that patient would not take it). Agree that starting new med in the midst of ongoing issues with med titration in general would be challenging. 3. NSVT - as above, refer to EP. K 3.6 on recent BMET. Start 21meq KCL. Check K/Mg when she comes in for her echo.  4. Mild mitral regurgitation - this will be able to be re-evaluated with echocardiogram.  COVID-19 Education: The signs and symptoms of COVID-19 were discussed with the patient and how to seek care  for testing (follow up with PCP or arrange E-visit). The importance of social distancing was discussed today.  Time:   Today, I have spent 22 minutes with the patient with telehealth technology discussing the above problems.     Medication Adjustments/Labs and Tests Ordered: Current medicines are reviewed at length with the patient today.  Concerns regarding medicines are outlined above.   Disposition:  Will refer to EP to get their input on her rhythm abnormalities and further plans for follow-up.  Signed, Charlie Pitter, PA-C  10/04/2018 10:38 AM    [employer]

## 2018-10-04 ENCOUNTER — Other Ambulatory Visit: Payer: Self-pay

## 2018-10-04 ENCOUNTER — Telehealth: Payer: Self-pay

## 2018-10-04 ENCOUNTER — Encounter: Payer: Self-pay | Admitting: Physician Assistant

## 2018-10-04 ENCOUNTER — Other Ambulatory Visit: Payer: Self-pay | Admitting: Cardiology

## 2018-10-04 ENCOUNTER — Telehealth: Payer: Self-pay | Admitting: Physician Assistant

## 2018-10-04 ENCOUNTER — Telehealth (INDEPENDENT_AMBULATORY_CARE_PROVIDER_SITE_OTHER): Payer: Medicare Other | Admitting: Physician Assistant

## 2018-10-04 VITALS — BP 106/72 | HR 72 | Ht 63.0 in | Wt 125.0 lb

## 2018-10-04 DIAGNOSIS — R0602 Shortness of breath: Secondary | ICD-10-CM | POA: Diagnosis not present

## 2018-10-04 DIAGNOSIS — I340 Nonrheumatic mitral (valve) insufficiency: Secondary | ICD-10-CM

## 2018-10-04 DIAGNOSIS — I472 Ventricular tachycardia: Secondary | ICD-10-CM

## 2018-10-04 DIAGNOSIS — I4729 Other ventricular tachycardia: Secondary | ICD-10-CM

## 2018-10-04 DIAGNOSIS — I471 Supraventricular tachycardia: Secondary | ICD-10-CM

## 2018-10-04 MED ORDER — POTASSIUM CHLORIDE CRYS ER 20 MEQ PO TBCR: 20.0000 meq | EXTENDED_RELEASE_TABLET | Freq: Every day | ORAL | 3 refills | Status: DC

## 2018-10-04 NOTE — Telephone Encounter (Signed)
Please let pt know Dr. [name] reviewed chart and agrees with plan. She recommends continuing daily aspirin if possible as well. Currently only listed as PRN. Shaquitta Burbridge PA-C

## 2018-10-04 NOTE — Patient Instructions (Addendum)
Medication Instructions:  Your physician has recommended you make the following change in your medication:  1.  START Potassium 20 meq taking 1 tablet daily  If you need a refill on your cardiac medications before your next appointment, please call your pharmacy.   Lab work: WHEN COME FOR ECHO:  BMET, PRO BNP, MAGNESIUM  If you have labs (blood work) drawn today and your tests are completely normal, you will receive your results only by: Marland Kitchen MyChart Message (if you have MyChart) OR . A paper copy in the mail If you have any lab test that is abnormal or we need to change your treatment, we will call you to review the results.  Testing/Procedures: Your physician has requested that you have an echocardiogram. Echocardiography is a painless test that uses sound waves to create images of your heart. It provides your doctor with information about the size and shape of your heart and how well your heart's chambers and valves are working. This procedure takes approximately one hour. There are no restrictions for this procedure.    Follow-Up: At [facility], you and your health needs are our priority.  As part of our continuing mission to provide you with exceptional heart care, we have created designated Provider Care Teams.  These Care Teams include your primary Cardiologist (physician) and Advanced Practice Providers (APPs -  Physician Assistants and Nurse Practitioners) who all work together to provide you with the care you need, when you need it. You have been referred to [facility].  SOMEONE WILL CALL YOU WITH AN APPOINTMENT.    Special Instructions Will Be Listed Below (If Applicable).  Echocardiogram An echocardiogram is a procedure that uses painless sound waves (ultrasound) to produce an image of the heart. Images from an echocardiogram can provide important information about:  Signs of coronary artery disease (CAD).  Aneurysm detection. An aneurysm is a weak or damaged part of an  artery wall that bulges out from the normal force of blood pumping through the body.  Heart size and shape. Changes in the size or shape of the heart can be associated with certain conditions, including heart failure, aneurysm, and CAD.  Heart muscle function.  Heart valve function.  Signs of a past heart attack.  Fluid buildup around the heart.  Thickening of the heart muscle.  A tumor or infectious growth around the heart valves. Tell a health care provider about:  Any allergies you have.  All medicines you are taking, including vitamins, herbs, eye drops, creams, and over-the-counter medicines.  Any blood disorders you have.  Any surgeries you have had.  Any medical conditions you have.  Whether you are pregnant or may be pregnant. What are the risks? Generally, this is a safe procedure. However, problems may occur, including:  Allergic reaction to dye (contrast) that may be used during the procedure. What happens before the procedure? No specific preparation is needed. You may eat and drink normally. What happens during the procedure?   An IV tube may be inserted into one of your veins.  You may receive contrast through this tube. A contrast is an injection that improves the quality of the pictures from your heart.  A gel will be applied to your chest.  A wand-like tool (transducer) will be moved over your chest. The gel will help to transmit the sound waves from the transducer.  The sound waves will harmlessly bounce off of your heart to allow the heart images to be captured in real-time  motion. The images will be recorded on a computer. The procedure may vary among health care providers and hospitals. What happens after the procedure?  You may return to your normal, everyday life, including diet, activities, and medicines, unless your health care provider tells you not to do that. Summary  An echocardiogram is a procedure that uses painless sound waves  (ultrasound) to produce an image of the heart.  Images from an echocardiogram can provide important information about the size and shape of your heart, heart muscle function, heart valve function, and fluid buildup around your heart.  You do not need to do anything to prepare before this procedure. You may eat and drink normally.  After the echocardiogram is completed, you may return to your normal, everyday life, unless your health care provider tells you not to do that. This information is not intended to replace advice given to you by your health care provider. Make sure you discuss any questions you have with your health care provider. Document Released: 04/11/2000 Document Revised: 05/17/2016 Document Reviewed: 05/17/2016 Elsevier Interactive Patient Education  2019 [place]

## 2018-10-04 NOTE — Telephone Encounter (Signed)
    COVID-19 Pre-Screening Questions:  . In the past 7 to 10 days have you had a cough,  shortness of breath, headache, congestion, fever (100 or greater) body aches, chills, sore throat, or sudden loss of taste or sense of smell? . Have you been around anyone with known Covid 19. . Have you been around anyone who is awaiting Covid 19 test results in the past 7 to 10 days? . Have you been around anyone who has been exposed to Covid 19, or has mentioned symptoms of Covid 19 within the past 7 to 10 days?  If you have any concerns/questions about symptoms patients report during screening (either on the phone or at threshold). Contact the provider seeing the patient or DOD for further guidance.  If neither are available contact a member of the leadership team.          Pt answered NO to all pre-screening question. [id number] 10/04/2018

## 2018-10-04 NOTE — Telephone Encounter (Signed)
Call placed to pt to let her know that Dr. [name] reviewed her chart with Melina Copa, PA-C, and agrees with the POC.  Pt also made aware that it is Dr Francesca Oman recommendation that she take the Aspirin 81 mg daily instead of PRN. Pt agreed to and medication has been updated in pt's chart. All questions, if any, have been answered.

## 2018-10-05 ENCOUNTER — Other Ambulatory Visit: Payer: Self-pay

## 2018-10-05 ENCOUNTER — Ambulatory Visit (HOSPITAL_COMMUNITY)
Admission: RE | Admit: 2018-10-05 | Discharge: 2018-10-05 | Disposition: A | Discharge status: Home / Self Care | Payer: Medicare Other | Source: Ambulatory Visit | Attending: Physician Assistant | Admitting: Physician Assistant

## 2018-10-05 ENCOUNTER — Telehealth: Payer: Self-pay | Admitting: *Deleted

## 2018-10-05 ENCOUNTER — Other Ambulatory Visit: Payer: Medicare Other

## 2018-10-05 DIAGNOSIS — R0602 Shortness of breath: Secondary | ICD-10-CM | POA: Diagnosis not present

## 2018-10-05 DIAGNOSIS — I471 Supraventricular tachycardia, unspecified: Secondary | ICD-10-CM

## 2018-10-05 DIAGNOSIS — I472 Ventricular tachycardia: Secondary | ICD-10-CM | POA: Diagnosis not present

## 2018-10-05 DIAGNOSIS — I4729 Other ventricular tachycardia: Secondary | ICD-10-CM

## 2018-10-05 DIAGNOSIS — I340 Nonrheumatic mitral (valve) insufficiency: Secondary | ICD-10-CM

## 2018-10-05 NOTE — Progress Notes (Signed)
  Echocardiogram 2D Echocardiogram has been performed.  [place] Deputy [name] 10/05/2018, 8:48 AM

## 2018-10-05 NOTE — Telephone Encounter (Signed)
Pt has been made aware of her echo results. 

## 2018-10-05 NOTE — Telephone Encounter (Signed)
Call placed to pt re: echo results, left a message for her to call back.

## 2018-10-05 NOTE — Telephone Encounter (Signed)
-----   Message from Charlie Pitter, [place] sent at 10/05/2018 10:02 AM EDT ----- Please let patient know echo is reassuring. Strong heart. Only mild leaking of mitral valve, would not be causing any symptoms or need any further workup right now. Dayna Dunn PA-C

## 2018-10-06 ENCOUNTER — Telehealth: Payer: Self-pay | Admitting: Cardiology

## 2018-10-06 LAB — BASIC METABOLIC PANEL
BUN/Creatinine Ratio: 12 (ref 12–28)
BUN: 10 mg/dL (ref 8–27)
CO2: 23 mmol/L (ref 20–29)
Calcium: 9.2 mg/dL (ref 8.7–10.3)
Chloride: 104 mmol/L (ref 96–106)
Creatinine, Ser: 0.82 mg/dL (ref 0.57–1.00)
GFR calc Af Amer: 84 mL/min/{1.73_m2} (ref >59)
GFR calc non Af Amer: 73 mL/min/{1.73_m2} (ref >59)
Glucose: 108 mg/dL — ABNORMAL HIGH (ref 65–99)
Potassium: 4 mmol/L (ref 3.5–5.2)
Sodium: 141 mmol/L (ref 134–144)

## 2018-10-06 LAB — MAGNESIUM: Magnesium: 1.9 mg/dL (ref 1.6–2.3)

## 2018-10-06 LAB — PRO B NATRIURETIC PEPTIDE: NT-Pro BNP: 80 pg/mL (ref 0–301)

## 2018-10-06 MED ORDER — POTASSIUM CHLORIDE 20 MEQ/15ML (10%) PO SOLN: 20.0000 meq | Freq: Every day | ORAL | 6 refills | Status: DC

## 2018-10-06 NOTE — Telephone Encounter (Signed)
Pt advised new RX sent in to pharmacy/ [facility].

## 2018-10-06 NOTE — Telephone Encounter (Signed)
New message   Pt c/o medication issue:  1. Name of Medication: potassium chloride SA (K-DUR) 20 MEQ tablet  2. How are you currently taking this medication (dosage and times per day)? 1 time daily  3. Are you having a reaction (difficulty breathing--STAT)?no   4. What is your medication issue?Patient is calling to see if can get this medication in liquid form.

## 2018-10-07 ENCOUNTER — Telehealth: Payer: Self-pay | Admitting: *Deleted

## 2018-10-07 NOTE — Telephone Encounter (Signed)

## 2018-10-12 ENCOUNTER — Other Ambulatory Visit: Payer: Self-pay

## 2018-10-12 ENCOUNTER — Telehealth (INDEPENDENT_AMBULATORY_CARE_PROVIDER_SITE_OTHER): Payer: Medicare Other | Admitting: Cardiology

## 2018-10-12 DIAGNOSIS — R002 Palpitations: Secondary | ICD-10-CM

## 2018-10-12 NOTE — Progress Notes (Signed)
Virtual Visit via Telephone Note   This visit type was conducted due to national recommendations for restrictions regarding the COVID-19 Pandemic (e.g. social distancing) in an effort to limit this patient's exposure and mitigate transmission in our community.  Due to her co-morbid illnesses, this patient is at least at moderate risk for complications without adequate follow up.  This format is felt to be most appropriate for this patient at this time.  The patient did not have access to video technology/had technical difficulties with video requiring transitioning to audio format only (telephone).  All issues noted in this document were discussed and addressed.  No physical exam could be performed with this format.  Please refer to the patient's chart for her  consent to telehealth for [facility].   Date:  10/12/2018   ID:  Victoria Cardenas, DOB Aug 11, 1950, MRN [id number]  Patient Location: Home Provider Location: Home  PCP:  Nolene Ebbs, MD  Cardiologist:  Ena Dawley, MD  Electrophysiologist:  None   Evaluation Performed:  Consultation - Victoria Cardenas was referred by Melina Copa for the evaluation of SVT.  Chief Complaint: Palpitations  History of Present Illness:    Victoria Cardenas is a 68 y.o. female with hypertension, hyperlipidemia, glaucoma, mild MR, short PR, subclinical hypothyroidism.  She has been started having palpitations associated with dizziness following a recent URI.  ZIO patch showed a sinus tachycardia and one episode of 9 beats of ventricular tachycardia.  She had 43 episodes of SVT.  It was thought that this was due to her hyperthyroidism.  She was started on methimazole.  She did not tolerate propranolol due to bradycardia and flushing and was started on metoprolol.  She not tolerate metoprolol and was started on diltiazem and holding losartan and metoprolol.  She is unfortunately continued to have palpitations.  She is on no rate controlling medications  at this time.  The patient does not have symptoms concerning for COVID-19 infection (fever, chills, cough, or new shortness of breath).    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   cholecalciferol (VITAMIN D3) 25 MCG (1000 UT) tablet Take 1,000 Units by mouth daily.   hydrALAZINE (APRESOLINE) 25 MG tablet Take 1 tablet (25 mg total) by mouth 3 (three) times daily.   LUMIGAN 0.01 % SOLN Place 1 drop into both eyes at bedtime.   methimazole (TAPAZOLE) 10 MG tablet Take 10 mg by mouth daily.   potassium chloride 20 MEQ/15ML (10%) SOLN Take 15 mLs (20 mEq total) by mouth daily.     Allergies:   Amlodipine, Penicillins, Cardizem cd [diltiazem], Lisinopril, Losartan, Propranolol, Ciprofloxacin, and Simvastatin   Social History   Tobacco Use   Smoking status: Never Smoker   Smokeless tobacco: Never Used  Substance Use Topics   Alcohol use: No  Drug use: No     Family Hx: The patient's family history includes Breast cancer in her maternal aunt and maternal grandmother; Breast cancer (age of onset: 73) in her mother; Hypertension in her mother.  ROS:   Please see the history of present illness.     All other systems reviewed and are negative.   Prior CV studies:   The following studies were reviewed today:  TTE 10/05/18  1. The left ventricle has normal systolic function with an ejection fraction of  60-65%. The cavity size was normal. Left ventricular diastolic parameters were normal. No evidence of left ventricular regional wall motion abnormalities.  2. The right ventricle has normal systolic function. The cavity was normal. There is no increase in right ventricular wall thickness. Right ventricular systolic pressure could not be assessed.  3. Mild thickening of the mitral valve leaflet. The MR jet is centrally-directed.  4. Mild thickening of the aortic valve. No stenosis of the aortic valve.  Cardiac monitor 08/31/18 personally reviewed  Sinus tach cardia to sinus tachycardia.  1 episode of ventricular tachycardia for total of 9 beats with ventricular rate 170 bpm.  43 episodes of SVTs some of which seem to represent atrial fibrillation.   Sinus tach cardia to sinus tachycardia. 1 episode of ventricular tachycardia for total of 9 beats with ventricular rate 170 bpm. 43 episodes of SVTs some of which seem to represent atrial fibrillation.   Labs/Other Tests and Data Reviewed:    EKG:  An ECG dated 09/20/18 was personally reviewed today and demonstrated:  Sinus rhythm, short PR  Recent Labs: 08/06/2018: ALT 16 09/20/2018: Hemoglobin 14.7; Platelets 219 09/22/2018: TSH 0.27 10/05/2018: BUN 10; Creatinine, Ser 0.82; Magnesium 1.9; NT-Pro BNP 80; Potassium 4.0; Sodium 141   Recent Lipid Panel No results found for: CHOL, TRIG, HDL, CHOLHDL, LDLCALC, LDLDIRECT  Wt Readings from Last 3 Encounters:  10/04/18 125 lb (56.7 kg)  09/20/18 125 lb (56.7 kg)  09/10/18 124 lb 6.4 oz (56.4 kg)     Objective:    Vital Signs:  BP 129/73    Pulse 64    VITAL SIGNS:  reviewed GEN:  no acute distress NEURO:  alert and oriented x 3, no obvious focal deficit PSYCH:  normal affect  ASSESSMENT & PLAN:    1. SVT: It appears that she is having an atrial tachycardia, though her symptoms at times, occur when she is in sinus rhythm.  I do not see anything too terribly worrisome from her atrial  arrhythmias.  Due to that, she would like to hold off on further therapy, as she is seeing her endocrinologist soon and has not seen much benefit in medical management. 2. NSVT: Had 9 beats of nonsustained VT on her monitor.  Fortunately her ejection fraction is normal and thus I do not think that she is at high risk.  We Janeen Watson continue to monitor.  COVID-19 Education: The signs and symptoms of COVID-19 were discussed with the patient and how to seek care for testing (follow up with PCP or arrange E-visit).  The importance of social distancing was discussed today.  Time:   Today, I have spent 9 minutes with the patient with telehealth technology discussing the above problems.     Medication Adjustments/Labs and Tests Ordered: Current medicines are reviewed at length with the patient today.  Concerns regarding medicines are outlined above.   Tests Ordered: No orders of the defined types were placed in this encounter.   Medication Changes: No  orders of the defined types were placed in this encounter.   Follow Up:  Virtual Visit or In Person in 3 month(s)  Signed, Eian Vandervelden Meredith Leeds, MD  10/12/2018 10:16 AM    [place]

## 2018-10-18 ENCOUNTER — Other Ambulatory Visit: Payer: Self-pay | Admitting: Cardiology

## 2018-10-20 ENCOUNTER — Encounter: Payer: Self-pay | Admitting: Internal Medicine

## 2018-10-20 ENCOUNTER — Ambulatory Visit (INDEPENDENT_AMBULATORY_CARE_PROVIDER_SITE_OTHER): Payer: Medicare Other | Admitting: Internal Medicine

## 2018-10-20 ENCOUNTER — Other Ambulatory Visit: Payer: Self-pay

## 2018-10-20 VITALS — BP 144/78 | HR 63 | Temp 97.9°F | Ht 63.0 in | Wt 124.6 lb

## 2018-10-20 DIAGNOSIS — E0590 Thyrotoxicosis, unspecified without thyrotoxic crisis or storm: Secondary | ICD-10-CM | POA: Diagnosis not present

## 2018-10-20 LAB — TSH: TSH: 2.27 u[IU]/mL (ref 0.35–4.50)

## 2018-10-20 LAB — T4, FREE: Free T4: 0.75 ng/dL (ref 0.60–1.60)

## 2018-10-20 NOTE — Progress Notes (Signed)
Name: Victoria Cardenas  MRN/ DOB: [id number], 02/04/1951    Age/ Sex: 68 y.o., female     PCP: Nolene Ebbs, MD   Reason for Endocrinology Evaluation: Subclinical hyperthyroidism     Initial Endocrinology Clinic Visit: 08/25/2018    PATIENT IDENTIFIER: [name] is a 68 y.o., female with a past medical history of HTN, Hx of breast cancer (S/P lumpectomy ) and Osteoporosis   . She has followed with [facility] since 08/25/2018 for consultative assistance with management of her Subclinical hyperthyroidism.   HISTORICAL SUMMARY:   Pt was noted with low TSH in 11/2016, at the time she presented to the ED for palpitations.   In April, 2020 she presented again with palpitations and sob and was found to have a TSH of 0.056 uIU/mL with normal T3 and T4.    No FH of thyroid disease.    Osteoporosis : She was diagnosed with osteoporosis 2017, and was started on boniva at the time, but pt stopped it due to palpitations and diarrhea ~ 2 yrs ago.  Currently she is only on Calcium and Vitamin D 2 tablets daily    SUBJECTIVE:   During last visit (09/10/2018): She did not start the methimazole that was started in April, concerned about side effects, discussed side effects again in detail.   Today (10/20/2018):  Victoria Cardenas is here for a 6 week follow up visit on subclinical hyperthyroidism. She continues with dizziness,but no weight loss, palpitations, or diarrhea.   Denies any local neck symptoms.  She is having headaches and is wondering if this is related to her methimazole.  She has eye allergies with itching, burning and blurry vision, she recently had an eye exam. She is prescribed allergy eye meds but has not been taking them.       ROS:  As per HPI.   HISTORY:  Past Medical History:  Past Medical History:  Diagnosis Date  . Breast cancer ([place]) 2005   right breast  . Glaucoma   . Hypercholesteremia   . Hypertension   . Medication intolerance     multiple  . Mild mitral regurgitation   . NSVT (nonsustained ventricular tachycardia) ([place])   . Shortened PR interval   . SVT (supraventricular tachycardia) ([facility])   . Thyroid disease    Past Surgical History:  Past Surgical History:  Procedure Laterality Date  . ABDOMINAL HYSTERECTOMY    . BREAST EXCISIONAL BIOPSY Left pt unsure   . 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 She has never used smokeless tobacco. She reports that she does not drink alcohol or use drugs. Family History:  Family History  Problem Relation Age of Onset  . Hypertension Mother   . Breast cancer Mother 80  . Breast cancer Maternal Aunt   . Breast cancer Maternal Grandmother      HOME MEDICATIONS: Allergies as of 10/20/2018      Reactions   Amlodipine Swelling, Palpitations   Swelling, heart pounding   Penicillins Hives   Has patient had a PCN reaction causing immediate rash, facial/tongue/throat swelling, SOB or lightheadedness with hypotension: yes Has patient had a PCN reaction causing severe rash involving mucus membranes or skin necrosis: no Has patient had a PCN reaction that required hospitalization: no Has patient had a PCN reaction occurring within the last 10 years: no If all of the above answers are "NO", then may proceed with Cephalosporin  Cardizem Cd [diltiazem] Other (See Comments)   Pt reports this med causes her fatigue, dizziness, lightheaded, sore throat, and hard swallowing meals.   Lisinopril    Patient refused prior rx due to potential side effects outlined in medication information   Losartan Other (See Comments)   Pt reports causes frequent urination and UTIs   Propranolol Other (See Comments)   Pt reports causes flushing    Ciprofloxacin Palpitations   Simvastatin Other (See Comments)   Increased urination      Medication List       Accurate as of October 20, 2018 10:38 AM. If you have any questions, ask your nurse or  doctor.        aspirin 81 MG chewable tablet Chew 81 mg by mouth daily.   cholecalciferol 25 MCG (1000 UT) tablet Commonly known as: VITAMIN D3 Take 1,000 Units by mouth daily.   hydrALAZINE 25 MG tablet Commonly known as: APRESOLINE Take 1 tablet (25 mg total) by mouth 3 (three) times daily.   Lumigan 0.01 % Soln Generic drug: bimatoprost Place 1 drop into both eyes at bedtime.   methimazole 10 MG tablet Commonly known as: TAPAZOLE Take 10 mg by mouth daily.   potassium chloride 20 MEQ/15ML (10%) Soln Take 15 mLs (20 mEq total) by mouth daily.         OBJECTIVE:   PHYSICAL EXAM: VS: BP (!) 144/78 (BP Location: Right Arm, Patient Position: Sitting, Cuff Size: Normal)   Pulse 63   Temp 97.9 F (36.6 C)   Ht 5\' 3"  (1.6 m)   Wt 124 lb 9.6 oz (56.5 kg)   SpO2 98%   BMI 22.07 kg/m    EXAM: General: Pt appears well and is in NAD  Eyes: Extensive conjunctival injection, no ecchymosis, proptosis or a stare.   Neck: General: Supple without adenopathy. Thyroid: Thyroid size normal.  No goiter or nodules appreciated. No thyroid bruit.  Lungs: Clear with good BS bilat with no rales, rhonchi, or wheezes  Heart: Auscultation: RRR.  Abdomen: Normoactive bowel sounds, soft, nontender, without masses or organomegaly palpable  Extremities:  BL LE: No pretibial edema normal ROM and strength.  Mental Status: Judgment, insight: Intact Orientation: Oriented to time, place, and person Mood and affect: No depression, anxiety, or agitation     DATA REVIEWED: Results for Victoria, Cardenas (MRN [id number]) as of 10/20/2018 15:23  Ref. Range 10/20/2018 09:58  TSH Latest Ref Range: 0.35 - 4.50 uIU/mL 2.27  T4,Free(Direct) Latest Ref Range: 0.60 - 1.60 ng/dL 0.75    Results for Victoria, Cardenas (MRN [id number]) as of 10/20/2018 09:48  Ref. 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 - She is tolerating methimazole well.  - Repeat TFT's today are normal   Medications  Continue Methimazole 10 mg daily     2. Multinodular Goiter   - No local neck symptoms  - She is S/P benign FNA of the right mid nodule in 2018 , other sub-centimeter nodules present on ultrasound bilaterally but did not require further follow up.  - Will consider repeating ultrasound by next visit to follow up on the right mid nodule that was previously biopsied.  3. Osteoporosis :  She has declined anti-resorptive therapy in the past. Pt does qualify for antiresorptive therapy.  4. Headaches:  - I reassured her this is not related to methimazole - We discussed causes such as vision changes ( has been having blurry vision) vs allergies, she is seeing her PCP tomorrow  5. Graves' Disease:  - No evidence of extra-thyroidal manifestations of graves' disease, pt advised to notify her ophthalmologist of this diagnosis.    Labs in 6 weeks    F/u in 3 month    Signed electronically by: Mack Guise, MD  [facility]  [address] [place], [place] [address] Phone: [phone] FAX: [phone]      CC: Nolene Ebbs, [address] [place] [address] Phone: [phone]  Fax: [phone]   Return to Endocrinology clinic as below: Future Appointments  Date Time Provider [place]  11/08/2018  9:20 AM Dorothy Spark, MD [facility]  12/01/2018  9:00 AM [facility] None  01/13/2019 11:30 AM Constance Haw, MD [facility]  01/20/2019 10:30 AM , Melanie Crazier, MD [facility] None

## 2018-10-21 ENCOUNTER — Telehealth: Payer: Self-pay | Admitting: Cardiology

## 2018-10-21 NOTE — Telephone Encounter (Signed)
Patient called she need to update her medication list.

## 2018-10-21 NOTE — Telephone Encounter (Signed)
Forwarding to Terin to call and discuss w/ pt

## 2018-10-22 ENCOUNTER — Encounter: Payer: Self-pay | Admitting: *Deleted

## 2018-10-22 NOTE — Telephone Encounter (Signed)
Returned patient's call.  Patient would like her pharmacy notified about Diltiazem and Losartan being discontinued.  She was shipped and charged for both medications that she no longer takes.   I notified pharmacy per Dr. [name] both Diltiazem and Losartan have been discontinued from patients medication list.

## 2018-10-27 ENCOUNTER — Other Ambulatory Visit: Payer: Self-pay | Admitting: Cardiology

## 2018-11-03 ENCOUNTER — Telehealth: Payer: Self-pay | Admitting: *Deleted

## 2018-11-03 NOTE — Telephone Encounter (Signed)
.      COVID-19 Pre-Screening Questions:  . In the past 7 to 10 days have you had a cough,  shortness of breath, headache, congestion, fever (100 or greater) body aches, chills, sore throat, or sudden loss of taste or sense of smell?NO . Have you been around anyone with known Covid 19.NO . Have you been around anyone who is awaiting Covid 19 test results in the past 7 to 10 days?NO . Have you been around anyone who has been exposed to Covid 19, or has mentioned symptoms of Covid 19 within the past 7 to 10 days?NO   Pt covid pre-screen questions were done and answered "no" to all.  Pt is aware of no visitor policy and to wear her face mask at all times to this appt.  Pt aware not to arrive more that 15 minutes prior to appointment time Pt verbalized understanding and agrees with this plan   If you have any concerns/questions about symptoms patients report during screening (either on the phone or at threshold). Contact the provider seeing the patient or DOD for further guidance.  If neither are available contact a member of the leadership team.            

## 2018-11-08 ENCOUNTER — Other Ambulatory Visit: Payer: Self-pay | Admitting: Cardiology

## 2018-11-08 ENCOUNTER — Ambulatory Visit (INDEPENDENT_AMBULATORY_CARE_PROVIDER_SITE_OTHER): Payer: Medicare Other | Admitting: Cardiology

## 2018-11-08 ENCOUNTER — Encounter: Payer: Self-pay | Admitting: Cardiology

## 2018-11-08 ENCOUNTER — Other Ambulatory Visit: Payer: Self-pay

## 2018-11-08 ENCOUNTER — Encounter: Payer: Self-pay | Admitting: *Deleted

## 2018-11-08 VITALS — BP 148/86 | HR 59 | Ht 63.0 in | Wt 125.6 lb

## 2018-11-08 DIAGNOSIS — I471 Supraventricular tachycardia: Secondary | ICD-10-CM | POA: Diagnosis not present

## 2018-11-08 DIAGNOSIS — R002 Palpitations: Secondary | ICD-10-CM

## 2018-11-08 DIAGNOSIS — I472 Ventricular tachycardia: Secondary | ICD-10-CM

## 2018-11-08 DIAGNOSIS — I4729 Other ventricular tachycardia: Secondary | ICD-10-CM

## 2018-11-08 NOTE — Patient Instructions (Signed)
Medication Instructions:   Your physician recommends that you continue on your current medications as directed. Please refer to the Current Medication list given to you today.  If you need a refill on your cardiac medications before your next appointment, please call your pharmacy.      Follow-Up:  WITH DR [name] AS A REGULAR OFFICE VISIT IN December 2020

## 2018-11-08 NOTE — Progress Notes (Signed)
Cardiology Office Note:    Date:  11/08/2018   ID:  Victoria Cardenas, DOB January 22, 1951, MRN [id number]  PCP:  Nolene Ebbs, MD  Cardiologist:  Ena Dawley, MD  Electrophysiologist:  Constance Haw, MD   Referring MD: Nolene Ebbs, MD   Chief complain: Palpitations  History of Present Illness:    Victoria Cardenas is a 68 y.o. female with a hx of hypertension, hyperlipidemia, glaucoma, mild MR, short PR, subclinical hypothyroidism.  She has been started having palpitations associated with dizziness following a recent URI.  ZIO patch showed a sinus tachycardia and one episode of 9 beats of ventricular tachycardia.  She had 43 episodes of SVT.  It was thought that this was due to her hyperthyroidism.  She was started on methimazole.  She did not tolerate propranolol due to bradycardia and flushing and was started on metoprolol.  She not tolerate metoprolol and was started on diltiazem and holding losartan and metoprolol.  She is unfortunately continued to have palpitations.  She is on no rate controlling medications at this time.  11/08/2018 -the patient is coming for follow-up, she has restarted methimazole with normalization of TSH and now significant improvement in symptoms.  She only gets occasional palpitations that have no associated symptoms such as dizziness or syncope.  She denies any chest pain.  She reports different side effects from hydralazine but is willing to continue it.  Past Medical History:  Diagnosis Date  . Breast cancer ([place]) 2005   right breast  . Glaucoma   . Hypercholesteremia   . Hypertension   . Medication intolerance    multiple  . Mild mitral regurgitation   . NSVT (nonsustained ventricular tachycardia) ([place])   . Shortened PR interval   . SVT (supraventricular tachycardia) ([facility])   . Thyroid disease     Past Surgical History:  Procedure Laterality Date  . ABDOMINAL HYSTERECTOMY    . BREAST EXCISIONAL BIOPSY Left pt unsure   . 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 aspirin 81 MG chewable tablet Chew 81 mg by mouth daily.  . cholecalciferol (VITAMIN D3) 25 MCG (1000 UT) tablet Take 1,000 Units by mouth daily.  . hydrALAZINE (APRESOLINE) 25 MG tablet Take 1 tablet (25 mg total) by mouth 3 (three) times daily.  Marland Kitchen LUMIGAN 0.01 % SOLN Place 1 drop into both eyes at bedtime.  . methimazole (TAPAZOLE) 10 MG tablet Take 10 mg by mouth daily.  . potassium chloride 20 MEQ/15ML (10%) SOLN Take 15 mLs (20 mEq total) by mouth daily.     Allergies:   Amlodipine, Penicillins, Cardizem cd [diltiazem], Lisinopril, Losartan, Propranolol, Ciprofloxacin, and Simvastatin   Social History   Socioeconomic History  . Marital status: Single    Spouse name: Not on file  . Number of children: Not on file  . Years of education: Not on file  . Highest education level: Not on file  Occupational History  . Not on file  Social Needs  . Financial resource strain: Not on file  . Food insecurity    Worry: Not on file    Inability: Not on file  . Transportation needs    Medical: Not on file    Non-medical: Not on file  Tobacco Use  . Smoking status: Never Smoker  . Smokeless tobacco: Never Used  Substance and Sexual Activity  . Alcohol use: No  . Drug use: No  . Sexual activity:  Not on file  Lifestyle  . Physical activity    Days per week: Not on file    Minutes per session: Not on file  . Stress: Not on file  Relationships  . Social Herbalist on phone: Not on file    Gets together: Not on file    Attends religious service: Not on file    Active member of club or organization: Not on file    Attends meetings of clubs or organizations: Not on file    Relationship status: Not on file  Other Topics Concern  . Not on file  Social History Narrative  . Not on file     Family History: The patient's family history includes Breast cancer in her maternal aunt and  maternal grandmother; Breast cancer (age of onset: 7) in her mother; Hypertension in her mother.  ROS:   Please see the history of present illness.    All other systems reviewed and are negative.  EKGs/Labs/Other Studies Reviewed:    The following studies were reviewed today:  EKG:  EKG is ordered today.  The ekg ordered today demonstrates sinus bradycardia, otherwise normal EKG.  Recent Labs: 08/06/2018: ALT 16 09/20/2018: Hemoglobin 14.7; Platelets 219 10/05/2018: BUN 10; Creatinine, Ser 0.82; Magnesium 1.9; NT-Pro BNP 80; Potassium 4.0; Sodium 141 10/20/2018: TSH 2.27  Recent Lipid Panel No results found for: CHOL, TRIG, HDL, CHOLHDL, VLDL, LDLCALC, LDLDIRECT  Physical Exam:    VS:  BP (!) 148/86   Pulse (!) 59   Ht 5\' 3"  (1.6 m)   Wt 125 lb 9.6 oz (57 kg)   SpO2 97%   BMI 22.25 kg/m     Wt Readings from Last 3 Encounters:  11/08/18 125 lb 9.6 oz (57 kg)  10/20/18 124 lb 9.6 oz (56.5 kg)  10/04/18 125 lb (56.7 kg)     GEN: Well nourished, well developed in no acute distress HEENT: Normal NECK: No JVD; No carotid bruits LYMPHATICS: No lymphadenopathy CARDIAC: RRR, no murmurs, rubs, gallops RESPIRATORY:  Clear to auscultation without rales, wheezing or rhonchi  ABDOMEN: Soft, non-tender, non-distended MUSCULOSKELETAL:  No edema; No deformity  SKIN: Warm and dry NEUROLOGIC:  Alert and oriented x 3 PSYCHIATRIC:  Normal affect   ASSESSMENT:    1. Heart palpitations   2. SVT (supraventricular tachycardia) ([place])   3. NSVT (nonsustained ventricular tachycardia) (H[facility])    PLAN:    In order of problems listed above:  1. SVT -significantly improved with methimazole, no additional therapy needed right now. 2. NSVT -seems to be resolved now with treatment of hyperthyroidism.  Her LVEF is normal, no further work-up needed. 3.  Right lower extremity edema, very minimal on physical exam, she does not want to try new medications, if needed I would add hydrochlorothiazide 12.5  mg daily. 4.  Hypertension, borderline, she reported previously intolerant to many medications she is reluctant to change hydralazine and is willing to continue.   Medication Adjustments/Labs and Tests Ordered: Current medicines are reviewed at length with the patient today.  Concerns regarding medicines are outlined above.  Orders Placed This Encounter  Procedures  . EKG 12-Lead   No orders of the defined types were placed in this encounter.   Patient Instructions  Medication Instructions:   Your physician recommends that you continue on your current medications as directed. Please refer to the Current Medication list given to you today.  If you need a refill on your cardiac medications before your next appointment,  please call your pharmacy.      Follow-Up:  WITH DR [name] AS A REGULAR OFFICE VISIT IN December 2020      Signed, Ena Dawley, MD  11/08/2018 10:13 AM    [place]

## 2018-11-12 ENCOUNTER — Other Ambulatory Visit: Payer: Self-pay | Admitting: Cardiology

## 2018-11-26 ENCOUNTER — Other Ambulatory Visit: Payer: Self-pay | Admitting: Cardiology

## 2018-11-29 ENCOUNTER — Telehealth: Payer: Self-pay | Admitting: Cardiology

## 2018-11-29 DIAGNOSIS — I10 Essential (primary) hypertension: Secondary | ICD-10-CM

## 2018-11-29 DIAGNOSIS — I471 Supraventricular tachycardia, unspecified: Secondary | ICD-10-CM

## 2018-11-29 DIAGNOSIS — Z79899 Other long term (current) drug therapy: Secondary | ICD-10-CM

## 2018-11-29 DIAGNOSIS — R0602 Shortness of breath: Secondary | ICD-10-CM

## 2018-11-29 DIAGNOSIS — I472 Ventricular tachycardia: Secondary | ICD-10-CM

## 2018-11-29 DIAGNOSIS — R739 Hyperglycemia, unspecified: Secondary | ICD-10-CM

## 2018-11-29 DIAGNOSIS — I340 Nonrheumatic mitral (valve) insufficiency: Secondary | ICD-10-CM

## 2018-11-29 DIAGNOSIS — I4729 Other ventricular tachycardia: Secondary | ICD-10-CM

## 2018-11-29 MED ORDER — CHLORTHALIDONE 25 MG PO TABS: 12.5000 mg | ORAL_TABLET | Freq: Every day | ORAL | 3 refills | Status: DC

## 2018-11-29 NOTE — Telephone Encounter (Signed)
Pt agreed to start the Chlorthalidone... will return 12/13/18 for labs and will keep track of her BP and let us nw if she is having problems.

## 2018-11-29 NOTE — Telephone Encounter (Signed)
Pt called to report that she has been having symptoms of "nervousness" and tingling in her extremities and her neck since starting the Hydralazine 25mg  po tid.. she says it has been worsening over the past week. She has muscle aches and increased fatigue.   She has not had any injures to her neck or back that would attribute to her symptoms.. she thinks it is related to taking the Hydralazine because it always seems worse a short time after each dose.   Her BP today and consistent with all of her other readings is 100/65 and HR 74.   She denies chest pain, dizziness, sob.   Dr. [name] is out of the office this week..  Will forward her message to the [facility] and APP for review and advice and recommendations re: the med.. pt will also call her PCP Dr. Jeanie Cooks for a possible appt in case the symptoms are not related to her meds.

## 2018-11-29 NOTE — Telephone Encounter (Signed)
  Pt c/o medication issue:  1. Name of Medication: hydrALAZINE (APRESOLINE) 25 MG tablet  2. How are you currently taking this medication (dosage and times per day)? Take 1 tablet (25 mg total) by mouth 3 (three) times daily.  3. Are you having a reaction (difficulty breathing--STAT)?  Nervousness, tingling in hands and feet, muscle spasms in neck and legs  4. What is your medication issue? Patient is calling because she has a constant feeling of nervousness with tingling in her hands and feet. She is also having some muscle spasms in her neck and legs. Please advise.

## 2018-11-29 NOTE — Telephone Encounter (Signed)
Hydralazine has been reported to cause anxiety and peripheral neuritis. Please advised patient to stop taking hydralazine and she can start taking chlorthalidone 12.5mg  daily. She will need a BMET is 2 weeks. Please advise patient that she may use the bathroom more frequently at first, but this effect will go away within 2 weeks.

## 2018-12-01 ENCOUNTER — Other Ambulatory Visit: Payer: Self-pay

## 2018-12-01 ENCOUNTER — Other Ambulatory Visit (INDEPENDENT_AMBULATORY_CARE_PROVIDER_SITE_OTHER): Payer: Medicare Other

## 2018-12-01 DIAGNOSIS — E0590 Thyrotoxicosis, unspecified without thyrotoxic crisis or storm: Secondary | ICD-10-CM

## 2018-12-02 ENCOUNTER — Telehealth: Payer: Self-pay | Admitting: Internal Medicine

## 2018-12-02 LAB — T4, FREE: Free T4: 0.9 ng/dL (ref 0.8–1.8)

## 2018-12-02 LAB — TSH: TSH: 4.6 mIU/L — ABNORMAL HIGH (ref 0.40–4.50)

## 2018-12-02 NOTE — Telephone Encounter (Signed)
Victoria Cardenas, please let her know her thyroid is improving, I would recommend taking half a tablet from now on, until her visit with me   Thank you    Abby Nena Jordan, MD  [facility]  [facility] [address] [place], [place] [address] Phone: [phone] FAX: [phone]

## 2018-12-02 NOTE — Telephone Encounter (Signed)
Pt aware of results and already has f/u scheduled

## 2018-12-06 ENCOUNTER — Telehealth: Payer: Self-pay | Admitting: Cardiology

## 2018-12-06 MED ORDER — SPIRONOLACTONE 25 MG PO TABS: 12.5000 mg | ORAL_TABLET | Freq: Every day | ORAL | 3 refills | Status: DC

## 2018-12-06 NOTE — Telephone Encounter (Signed)
New Message     Pt c/o medication issue:  1. Name of Medication: Chlorthalidone   2. How are you currently taking this medication (dosage and times per day)? 25 mg 1/2 tablet daily   3. Are you having a reaction (difficulty breathing--STAT)? Tightness in throat   4. What is your medication issue? Pt says when she eats she gets a racing heartbeat and then after she gets a low heart beat. She feels dizzy and tired

## 2018-12-06 NOTE — Telephone Encounter (Signed)
Spoke with pt and started the Chlorthalidone on Tues and Thursday noted throat tightness and Friday noted variation in heart rate after eating slow then fast  Also complaining of fatigue and dizziness B/P today 107/59 Pt also states during the night body will start a "jerking movement and awakens pt"Per pt thyroid medicine was recently adjusted on Friday Discussed with Meagan Supple Pharm D  have pt stop Chlorthalidone and start Spironolactone 12.5 mg every day and keep lab appt as scheduled Also instructed pt to keep B/P log Pt verbalizes understanding Also instructed to allow more time with thyroid med change and see if some of symptoms improve as well ./cy

## 2018-12-07 ENCOUNTER — Telehealth: Payer: Self-pay | Admitting: Internal Medicine

## 2018-12-07 NOTE — Telephone Encounter (Signed)
Please advise 

## 2018-12-07 NOTE — Telephone Encounter (Signed)
Patient states that her Cardiologist has put her on Chlorthalidone and that this is effecting her Thyroid and the medication that she takes.  Needs advise on how to proceed.  Please call at [phone]

## 2018-12-07 NOTE — Telephone Encounter (Signed)
Pt informed and that she should contact cardiology if she is experiencing a reaction to new medication prescribed by them

## 2018-12-13 ENCOUNTER — Other Ambulatory Visit: Payer: Self-pay

## 2018-12-13 ENCOUNTER — Other Ambulatory Visit: Payer: Medicare Other | Admitting: *Deleted

## 2018-12-13 ENCOUNTER — Encounter (INDEPENDENT_AMBULATORY_CARE_PROVIDER_SITE_OTHER): Payer: Self-pay

## 2018-12-13 DIAGNOSIS — I4729 Other ventricular tachycardia: Secondary | ICD-10-CM

## 2018-12-13 DIAGNOSIS — R739 Hyperglycemia, unspecified: Secondary | ICD-10-CM

## 2018-12-13 DIAGNOSIS — I472 Ventricular tachycardia: Secondary | ICD-10-CM

## 2018-12-13 DIAGNOSIS — I471 Supraventricular tachycardia: Secondary | ICD-10-CM

## 2018-12-13 DIAGNOSIS — R0602 Shortness of breath: Secondary | ICD-10-CM

## 2018-12-13 DIAGNOSIS — Z79899 Other long term (current) drug therapy: Secondary | ICD-10-CM

## 2018-12-13 DIAGNOSIS — I340 Nonrheumatic mitral (valve) insufficiency: Secondary | ICD-10-CM

## 2018-12-13 DIAGNOSIS — I10 Essential (primary) hypertension: Secondary | ICD-10-CM

## 2018-12-13 LAB — BASIC METABOLIC PANEL
BUN/Creatinine Ratio: 11 — ABNORMAL LOW (ref 12–28)
BUN: 9 mg/dL (ref 8–27)
CO2: 24 mmol/L (ref 20–29)
Calcium: 9.3 mg/dL (ref 8.7–10.3)
Chloride: 104 mmol/L (ref 96–106)
Creatinine, Ser: 0.83 mg/dL (ref 0.57–1.00)
GFR calc Af Amer: 83 mL/min/{1.73_m2} (ref >59)
GFR calc non Af Amer: 72 mL/min/{1.73_m2} (ref >59)
Glucose: 111 mg/dL — ABNORMAL HIGH (ref 65–99)
Potassium: 3.9 mmol/L (ref 3.5–5.2)
Sodium: 142 mmol/L (ref 134–144)

## 2018-12-15 ENCOUNTER — Telehealth: Payer: Self-pay | Admitting: Cardiology

## 2018-12-15 NOTE — Telephone Encounter (Signed)
Pt called to report that since starting the Spironolactone 12.5mg  qd on 12/06/18 she is still having problems with feeling "jittery", dizzy, cannot sleep and feels firmly the diuretic is causing it... she is also having burning with urination... her BP today ios 105/67 and HR 66... she has had her dose this morning... I have asked her to call her PCP re: the possibility of her having a UTI and to hold her Spironolactone for a couple if days but to closely monitor her BP and record her readings and see if she notices a difference in her symptoms and to please call us and let us know.   Will forward to Dr. [name] for review.

## 2018-12-15 NOTE — Telephone Encounter (Signed)
Pt c/o medication issue:  1. Name of Medication: spironolactone (ALDACTONE) 25 MG tablet  2. How are you currently taking this medication (dosage and times per day)  0.5 tablets (12.5 mg total) by mouth daily.  3. Are you having a reaction (difficulty breathing--STAT)? At first, but not now   4. What is your medication issue? Throat swelling Dizziness  Fatigue Burning after urination  Tingling in the feet Difficulty sleeping

## 2018-12-24 ENCOUNTER — Telehealth: Payer: Self-pay | Admitting: Cardiology

## 2018-12-24 NOTE — Telephone Encounter (Signed)
  Pt c/o of Chest Pain: STAT if CP now or developed within 24 hours  1. Are you having CP right now? About 20 mins ago  2. Are you experiencing any other symptoms (ex. SOB, nausea, vomiting, sweating)? no  3. How long have you been experiencing CP? Last night and today  4. Is your CP continuous or coming and going? Comes and goes  5. Have you taken Nitroglycerin? no ?

## 2018-12-24 NOTE — Telephone Encounter (Signed)
Pt calling today with c/o "chest cramping with some left arm tingling." She states she was having similar sx and urinary sx while on spironolactone. She stopped spironolactone on 8/19. She states her urinary sx have stopped but her chest cramping remains. She states it "comes and goes" and does not get worse or better with rest. She took a baby ASA this morning as well. She does not know if her pain is worse after eating or drinking because she has not eaten anything today.   I have advised pt to seek evaluation from the ED for her CP. I could not find any recent cardiac dx testing ruling out CAD, so it will be important for pt to be evaluated further.   She has verbalized understanding and had no additional questions.

## 2019-01-13 ENCOUNTER — Ambulatory Visit (INDEPENDENT_AMBULATORY_CARE_PROVIDER_SITE_OTHER): Payer: Medicare Other | Admitting: Cardiology

## 2019-01-13 ENCOUNTER — Encounter: Payer: Self-pay | Admitting: Cardiology

## 2019-01-13 ENCOUNTER — Other Ambulatory Visit: Payer: Self-pay

## 2019-01-13 VITALS — BP 162/98 | HR 53 | Ht 63.0 in | Wt 125.4 lb

## 2019-01-13 DIAGNOSIS — I471 Supraventricular tachycardia: Secondary | ICD-10-CM | POA: Diagnosis not present

## 2019-01-13 NOTE — Progress Notes (Signed)
Electrophysiology Office Note   Date:  01/13/2019   ID:  Victoria Cardenas, DOB 1951-03-07, MRN [id number]  PCP:  Nolene Ebbs, MD  Cardiologist:  Victorino Sparrow Primary Electrophysiologist:  Constance Haw, MD    Chief Complaint: SVT   History of Present Illness: Victoria Cardenas is a 68 y.o. female who is being seen today for the evaluation of SVT at the request of Melina Copa. Presenting today for electrophysiology evaluation.  She has a history of right-sided breast cancer, hypertension, hyperlipidemia, SVT, and nonsustained VT.  She wore a ZIO patch which showed sinus tachycardia, episodes of SVT as well as nonsustained VT.  He was found to be hyperthyroid and has since started methimazole.  Today, she denies symptoms of palpitations, chest pain, shortness of breath, orthopnea, PND, lower extremity edema, claudication, dizziness, presyncope, syncope, bleeding, or neurologic sequela. The patient is tolerating medications without difficulties.  Brings in blood pressure recordings from home that have shown systolic blood pressures of 1 10-1 20s.  She is overall felt well without major complaint.   Past Medical History:  Diagnosis Date  . Breast cancer ([place]) 2005   right breast  . Glaucoma   . Hypercholesteremia   . Hypertension   . Medication intolerance    multiple  . Mild mitral regurgitation   . NSVT (nonsustained ventricular tachycardia) ([place])   . Shortened PR interval   . SVT (supraventricular tachycardia) ([facility])   . Thyroid disease    Past Surgical History:  Procedure Laterality Date  . ABDOMINAL HYSTERECTOMY    . BREAST EXCISIONAL BIOPSY Left pt unsure   . 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 aspirin EC 81 MG tablet Take 81 mg by mouth every other day.    . cholecalciferol (VITAMIN D3) 25 MCG (1000 UT) tablet Take 1,000 Units by mouth daily.    Marland Kitchen LUMIGAN 0.01 % SOLN Place 1 drop into  both eyes at bedtime.    . methimazole (TAPAZOLE) 10 MG tablet Take 5 mg by mouth daily.    . potassium chloride 20 MEQ/15ML (10%) SOLN Take 15 mLs (20 mEq total) by mouth daily. 473 mL 6   No current facility-administered medications for this visit.     Allergies:   Amlodipine, Penicillins, Cardizem cd [diltiazem], Lisinopril, Losartan, Propranolol, Ciprofloxacin, and Simvastatin   Social History:  The patient  reports that she has never smoked. She has never used smokeless tobacco. She reports that she does not drink alcohol or use drugs.   Family History:  The patient's family history includes Breast cancer in her maternal aunt and maternal grandmother; Breast cancer (age of onset: 19) in her mother; Hypertension in her mother.    ROS:  Please see the history of present illness.   Otherwise, review of systems is positive for none.   All other systems are reviewed and negative.    PHYSICAL EXAM: VS:  BP (!) 162/98   Pulse (!) 53   Ht 5\' 3"  (1.6 m)   Wt 125 lb 6.4 oz (56.9 kg)   SpO2 99%   BMI 22.21 kg/m  , BMI Body mass index is 22.21 kg/m. GEN: Well nourished, well developed, in no acute distress  HEENT: normal  Neck: no JVD, carotid bruits, or masses Cardiac: RRR; no murmurs, rubs, or gallops,no edema  Respiratory:  clear to auscultation bilaterally, normal work of breathing GI: soft, nontender, nondistended, + BS  MS: no deformity or atrophy  Skin: warm and dry Neuro:  Strength and sensation are intact Psych: euthymic mood, full affect  EKG:  EKG is ordered today. Personal review of the ekg ordered shows sinus rhythm, rate 53, PR 110  Recent Labs: 08/06/2018: ALT 16 09/20/2018: Hemoglobin 14.7; Platelets 219 10/05/2018: Magnesium 1.9; NT-Pro BNP 80 12/01/2018: TSH 4.60 12/13/2018: BUN 9; Creatinine, Ser 0.83; Potassium 3.9; Sodium 142    Lipid Panel  No results found for: CHOL, TRIG, HDL, CHOLHDL, VLDL, LDLCALC, LDLDIRECT   Wt Readings from Last 3 Encounters:   01/13/19 125 lb 6.4 oz (56.9 kg)  11/08/18 125 lb 9.6 oz (57 kg)  10/20/18 124 lb 9.6 oz (56.5 kg)      Other studies Reviewed: Additional studies/ records that were reviewed today include: TTE 10/05/18  Review of the above records today demonstrates:   1. The left ventricle has normal systolic function with an ejection fraction of 60-65%. The cavity size was normal. Left ventricular diastolic parameters were normal. No evidence of left ventricular regional wall motion abnormalities.  2. The right ventricle has normal systolic function. The cavity was normal. There is no increase in right ventricular wall thickness. Right ventricular systolic pressure could not be assessed.  3. Mild thickening of the mitral valve leaflet. The MR jet is centrally-directed.  4. Mild thickening of the aortic valve. No stenosis of the aortic valve.   ASSESSMENT AND PLAN:  1.  SVT: Appears to be atrial tachycardia.  She was hyperthyroid and has since started methimazole.  She has done much better and has had no further palpitations.  No changes.  2.  Nonsustained VT: Found on cardiac monitor.  Normal ejection fraction.  No changes.  3.  Elevated blood pressure: Blood pressure is elevated today but she brings in blood pressure results that are in the [id number] systolic from home.  We  make no changes.  Current medicines are reviewed at length with the patient today.   The patient does not have concerns regarding her medicines.  The following changes were made today:  none  Labs/ tests ordered today include:  No orders of the defined types were placed in this encounter.    Disposition:   FU with   PRN  Signed,  Meredith Leeds, MD  01/13/2019 11:44 AM     [address] [place] [address] [phone] (office) [phone] (fax)

## 2019-01-17 ENCOUNTER — Other Ambulatory Visit (INDEPENDENT_AMBULATORY_CARE_PROVIDER_SITE_OTHER): Payer: Medicare Other

## 2019-01-17 DIAGNOSIS — I471 Supraventricular tachycardia: Secondary | ICD-10-CM | POA: Diagnosis not present

## 2019-01-18 ENCOUNTER — Other Ambulatory Visit: Payer: Self-pay

## 2019-01-20 ENCOUNTER — Other Ambulatory Visit: Payer: Self-pay

## 2019-01-20 ENCOUNTER — Encounter: Payer: Self-pay | Admitting: Internal Medicine

## 2019-01-20 ENCOUNTER — Ambulatory Visit (INDEPENDENT_AMBULATORY_CARE_PROVIDER_SITE_OTHER): Payer: Medicare Other | Admitting: Internal Medicine

## 2019-01-20 VITALS — BP 136/78 | HR 59 | Temp 97.8°F | Ht 63.0 in | Wt 124.6 lb

## 2019-01-20 DIAGNOSIS — E042 Nontoxic multinodular goiter: Secondary | ICD-10-CM | POA: Diagnosis not present

## 2019-01-20 DIAGNOSIS — E0590 Thyrotoxicosis, unspecified without thyrotoxic crisis or storm: Secondary | ICD-10-CM | POA: Diagnosis not present

## 2019-01-20 LAB — T4, FREE: Free T4: 0.89 ng/dL (ref 0.60–1.60)

## 2019-01-20 LAB — TSH: TSH: 3.08 u[IU]/mL (ref 0.35–4.50)

## 2019-01-20 NOTE — Progress Notes (Signed)
Name: Victoria Cardenas  MRN/ DOB: [id number], 15-Mar-1951    Age/ Sex: 68 y.o., female     PCP: Nolene Ebbs, MD   Reason for Endocrinology Evaluation: Subclinical hyperthyroidism     Initial Endocrinology Clinic Visit: 08/25/2018    PATIENT IDENTIFIER: Victoria Cardenas is a 68 y.o., female with a past medical history of HTN, Hx of breast cancer (S/P lumpectomy ) and Osteoporosis   . She has followed with [facility] since 08/25/2018 for consultative assistance with management of her Subclinical hyperthyroidism.   HISTORICAL SUMMARY:   Pt was noted with low TSH in 11/2016, at the time she presented to the ED for palpitations.   In April, 2020 she presented again with palpitations and sob and was found to have a TSH of 0.056 uIU/mL with normal T3 and T4. This was attributed to Graves' Disease due to elevated TRAb level of 3.34 IU/L.    No FH of thyroid disease.    Osteoporosis : She was diagnosed with osteoporosis 2017, and was started on boniva at the time, but pt stopped it due to palpitations and diarrhea ~ 2 yrs ago.  Currently she is only on Calcium and Vitamin D 2 tablets daily  She declines antiresorptive therapy    SUBJECTIVE:   During last visit (10/20/2018): She was continued on Methimazole   Today (01/20/2019):  Victoria Cardenas is here for a  3 months follow up visit on subclinical hyperthyroidism secondary to Graves' Disease.  She has been compliant with methimazole intake.   Dizziness has improved since stopping B-blockers She denies any palpitation but has chronic constipation and occasional fatigue.  She is having issues with throat tightening and difficulty swallowing  . She had indigestion  In the past and stopped the medication. Weight has been stable     Medications: Methimazole 5 mg daily    ROS:  As per HPI.   HISTORY:  Past Medical History:  Past Medical History:  Diagnosis Date  . Breast cancer ([place]) 2005   right breast  .  Glaucoma   . Hypercholesteremia   . Hypertension   . Medication intolerance    multiple  . Mild mitral regurgitation   . NSVT (nonsustained ventricular tachycardia) ([place])   . Shortened PR interval   . SVT (supraventricular tachycardia) ([facility])   . Thyroid disease    Past Surgical History:  Past Surgical History:  Procedure Laterality Date  . ABDOMINAL HYSTERECTOMY    . BREAST EXCISIONAL BIOPSY Left pt unsure   . 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 She has never used smokeless tobacco. She reports that she does not drink alcohol or use drugs. Family History:  Family History  Problem Relation Age of Onset  . Hypertension Mother   . Breast cancer Mother 5  . Breast cancer Maternal Aunt   . Breast cancer Maternal Grandmother      HOME MEDICATIONS: Allergies as of 01/20/2019      Reactions   Amlodipine Swelling, Palpitations   Swelling, heart pounding   Penicillins Hives   Has patient had a PCN reaction causing immediate rash, facial/tongue/throat swelling, SOB or lightheadedness with hypotension: yes Has patient had a PCN reaction causing severe rash involving mucus membranes or skin necrosis: no Has patient had a PCN reaction that required hospitalization: no Has patient had a PCN reaction occurring within the last 10 years: no If all of the above answers  are "NO", then may proceed with Cephalosporin   Cardizem Cd [diltiazem] Other (See Comments)   Pt reports this med causes her fatigue, dizziness, lightheaded, sore throat, and hard swallowing meals.   Lisinopril    Patient refused prior rx due to potential side effects outlined in medication information   Losartan Other (See Comments)   Pt reports causes frequent urination and UTIs   Propranolol Other (See Comments)   Pt reports causes flushing    Ciprofloxacin Palpitations   Simvastatin Other (See Comments)   Increased urination      Medication List        Accurate as of January 20, 2019 10:17 AM. If you have any questions, ask your nurse or doctor.        aspirin EC 81 MG tablet Take 81 mg by mouth every other day.   cholecalciferol 25 MCG (1000 UT) tablet Commonly known as: VITAMIN D3 Take 1,000 Units by mouth daily.   Lumigan 0.01 % Soln Generic drug: bimatoprost Place 1 drop into both eyes at bedtime.   methimazole 10 MG tablet Commonly known as: TAPAZOLE Take 5 mg by mouth daily.   potassium chloride 20 MEQ/15ML (10%) Soln Take 15 mLs (20 mEq total) by mouth daily.   Xiidra 5 % Soln Generic drug: Lifitegrast Apply to eye.         OBJECTIVE:   PHYSICAL EXAM: VS: BP 136/78 (BP Location: Left Arm, Patient Position: Sitting, Cuff Size: Normal)   Pulse (!) 59   Temp 97.8 F (36.6 C)   Ht 5\' 3"  (1.6 m)   Wt 124 lb 9.6 oz (56.5 kg)   SpO2 99%   BMI 22.07 kg/m    EXAM: General: Pt appears well and is in NAD  Eyes: Extensive conjunctival injection, no ecchymosis, proptosis or a stare.   Neck: General: Supple without adenopathy. Thyroid: Thyroid size normal.  No goiter or nodules appreciated. No thyroid bruit.  Lungs: Clear with good BS bilat with no rales, rhonchi, or wheezes  Heart: Auscultation: RRR.  Abdomen: Normoactive bowel sounds, soft, nontender, without masses or organomegaly palpable  Extremities:  BL LE: No pretibial edema normal ROM and strength.  Mental Status: Judgment, insight: Intact Orientation: Oriented to time, place, and person Mood and affect: No depression, anxiety, or agitation     DATA REVIEWED: Results for Victoria, Cardenas (MRN [id number]) as of 10/20/2018 15:23  Ref. Range 10/20/2018 09:58  TSH Latest Ref Range: 0.35 - 4.50 uIU/mL 2.27  T4,Free(Direct) Latest Ref Range: 0.60 - 1.60 ng/dL 0.75    Results for Victoria, Cardenas (MRN [id number]) as of 10/20/2018 09:48  Ref. Range 09/10/2018 11:13  TRAB Latest Ref Range: <=2.00 IU/L 3.34 (H)     ASSESSMENT / PLAN /  RECOMMENDATIONS:   Subclinical Hyperthyroidism Secondary to Graves' Disease:  - We again discussed the risk of subclinical hyperthyroidism and its association with an increased risk of atrial fibrillation, mortality and, primarily in postmenopausal women, a decrease in bone mineral density. - She is tolerating methimazole well.  - Repeat TFT's today are normal   Medications  Decrease Methimazole to 2.5 mg daily     2. Multinodular Goiter   - No local neck symptoms  - She is S/P benign FNA of the right mid nodule in 2018 , other sub-centimeter nodules present on ultrasound bilaterally but did not require further follow up.    3. Graves' Disease:  - No evidence of extra-thyroidal manifestations of graves' disease, pt advised to notify  her ophthalmologist of this diagnosis.    Labs in 6 weeks    F/u in 3 month    Signed electronically by: Mack Guise, MD  [facility]  [facility] [address] [place], [place] [address] Phone: [phone] FAX: [phone]      CC: Nolene Ebbs, [place] [address] Phone: [phone]  Fax: [phone]   Return to Endocrinology clinic as below: Future Appointments  Date Time Provider [place]  01/20/2019 10:30 AM Sherrie Marsan, Melanie Crazier, MD [facility] None  03/04/2019  9:30 AM [facility] LAB [facility] None  04/04/2019 10:30 AM Juanelle Trueheart, Melanie Crazier, MD [facility] None  04/14/2019  9:20 AM Dorothy Spark, MD [facility]

## 2019-01-20 NOTE — Patient Instructions (Addendum)
-   Continue Methimazole 5 mg [id number]

## 2019-01-24 ENCOUNTER — Telehealth: Payer: Self-pay | Admitting: Internal Medicine

## 2019-01-24 ENCOUNTER — Encounter: Payer: Self-pay | Admitting: Internal Medicine

## 2019-01-24 MED ORDER — METHIMAZOLE 5 MG PO TABS: 2.5000 mg | ORAL_TABLET | Freq: Every day | ORAL | 1 refills | Status: DC

## 2019-01-24 NOTE — Telephone Encounter (Signed)
Please let her know her thyroid continues to do better and will need to cut down the 5 mg of methimazole to 2.5 mg daily    She currently has the 10 mg tablets and has been taking half a tablet.    I sent in the 5 mg tablets she needs to pick those up and start taking half of that daily until her next lab appointment.      Thanks

## 2019-01-24 NOTE — Telephone Encounter (Signed)
Spoke to pt and reviewed labs and instructions, pt stated an understanding

## 2019-01-27 ENCOUNTER — Telehealth: Payer: Self-pay | Admitting: Cardiology

## 2019-01-27 NOTE — Telephone Encounter (Signed)
Pt is calling in to let Dr. [name] know that she never received her lab results from 8/17, and is inquiring if she should continue taking her prescribed dose of potassium chloride solution.  Pt states she is by the way doing much better with her symptoms and being off all her BP medications.   Pt states she has no more side effects and her BP and HR have been within normal limits.  Advised the pt that she should continue her current medication regimen as is, until I can have Dr. [name] review her 8/17 BMET result and further advise on if she should continue taking her current potassium medication regimen. Informed the pt that I will follow-up with her shortly thereafter, once recommendations received. Pt verbalized understanding and agrees with this plan.

## 2019-01-27 NOTE — Telephone Encounter (Signed)
Patient states she had lab work done back in August and never received the results to it. She wants to know if she has to continue taking potassium chloride 20 MEQ/15ML.

## 2019-01-27 NOTE — Telephone Encounter (Signed)
K+ looked good so continue current meds

## 2019-01-28 NOTE — Telephone Encounter (Signed)
Left the pt a message to call the office back to endorse recommendations per Dr Radford Pax.

## 2019-01-28 NOTE — Telephone Encounter (Signed)
Spoke with the pt and informed her that per Dr Radford Pax, covering for Dr [name], said her labs and K looked good and she should continue her current meds, including potassium. Pt verbalized understanding and agrees with this plan.

## 2019-03-04 ENCOUNTER — Other Ambulatory Visit (INDEPENDENT_AMBULATORY_CARE_PROVIDER_SITE_OTHER): Payer: Medicare Other

## 2019-03-04 ENCOUNTER — Other Ambulatory Visit: Payer: Self-pay

## 2019-03-04 DIAGNOSIS — E0590 Thyrotoxicosis, unspecified without thyrotoxic crisis or storm: Secondary | ICD-10-CM

## 2019-03-04 LAB — TSH: TSH: 1.13 u[IU]/mL (ref 0.35–4.50)

## 2019-03-04 LAB — T4, FREE: Free T4: 0.91 ng/dL (ref 0.60–1.60)

## 2019-03-07 ENCOUNTER — Encounter: Payer: Self-pay | Admitting: Internal Medicine

## 2019-03-31 ENCOUNTER — Other Ambulatory Visit: Payer: Self-pay

## 2019-04-04 ENCOUNTER — Other Ambulatory Visit: Payer: Self-pay

## 2019-04-04 ENCOUNTER — Ambulatory Visit (INDEPENDENT_AMBULATORY_CARE_PROVIDER_SITE_OTHER): Payer: Medicare Other | Admitting: Internal Medicine

## 2019-04-04 ENCOUNTER — Encounter: Payer: Self-pay | Admitting: Internal Medicine

## 2019-04-04 ENCOUNTER — Telehealth: Payer: Self-pay | Admitting: Internal Medicine

## 2019-04-04 VITALS — BP 132/78 | HR 64 | Temp 97.8°F | Ht 63.0 in | Wt 125.0 lb

## 2019-04-04 DIAGNOSIS — E0500 Thyrotoxicosis with diffuse goiter without thyrotoxic crisis or storm: Secondary | ICD-10-CM | POA: Insufficient documentation

## 2019-04-04 DIAGNOSIS — E0590 Thyrotoxicosis, unspecified without thyrotoxic crisis or storm: Secondary | ICD-10-CM

## 2019-04-04 LAB — T4, FREE: Free T4: 0.76 ng/dL (ref 0.60–1.60)

## 2019-04-04 LAB — TSH: TSH: 1.53 u[IU]/mL (ref 0.35–4.50)

## 2019-04-04 MED ORDER — METHIMAZOLE 5 MG PO TABS: 2.5000 mg | ORAL_TABLET | Freq: Every day | ORAL | 3 refills | Status: DC

## 2019-04-04 NOTE — Telephone Encounter (Signed)
Please let her know her thyroid is normal and to continue with the current dose of methimazole (half tablet daily )   Thanks  Abby Nena Jordan, MD  [facility]  [facility] [address] [place], [place] [address] Phone: [phone] FAX: [phone]

## 2019-04-04 NOTE — Patient Instructions (Signed)
-   Continue Methimazole HALF a tablet daily  

## 2019-04-04 NOTE — Telephone Encounter (Signed)
Pt aware of results and stated an understanding

## 2019-04-04 NOTE — Progress Notes (Signed)
Name: Victoria Cardenas  MRN/ DOB: [id number], 07/11/1950    Age/ Sex: 68 y.o., female     PCP: Victoria Ebbs, MD   Reason for Endocrinology Evaluation: Subclinical hyperthyroidism     Initial Endocrinology Clinic Visit: 08/25/2018    PATIENT IDENTIFIER: Victoria Cardenas is a 68 y.o., female with a past medical history of HTN, Hx of breast cancer (S/P lumpectomy ) and Osteoporosis   . She has followed with [facility] since 08/25/2018 for consultative assistance with management of her Subclinical hyperthyroidism.   HISTORICAL SUMMARY:   Pt was noted with low TSH in 11/2016, at the time she presented to the ED for palpitations.   In April, 2020 she presented again with palpitations and sob and was found to have a TSH of 0.056 uIU/mL with normal T3 and T4. This was attributed to Graves' Disease due to elevated TRAb level of 3.34 IU/L.    No FH of thyroid disease.    Osteoporosis : She was diagnosed with osteoporosis 2017, and was started on boniva at the time, but pt stopped it due to palpitations and diarrhea ~ 2 yrs ago.  Currently she is only on Calcium and Vitamin D 2 tablets daily  She declines antiresorptive therapy    SUBJECTIVE:   During last visit (01/20/2019): We decreased  Methimazole dose.   Today (04/04/2019):  Victoria Cardenas is here for a  3 months follow up visit on subclinical hyperthyroidism secondary to Graves' Disease.  She has been compliant with methimazole intake.   Has occasional headaches and dizziness.  She denies any palpitation but has chronic constipation   Denies any local  She continues with throat tightening and difficulty swallowing at times.  Weight has been stable     Medications: Methimazole 2.5 mg daily    ROS:  As per HPI.   HISTORY:  Past Medical History:  Past Medical History:  Diagnosis Date  . Breast cancer ([place]) 2005   right breast  . Glaucoma   . Hypercholesteremia   . Hypertension   . Medication  intolerance    multiple  . Mild mitral regurgitation   . NSVT (nonsustained ventricular tachycardia) ([place])   . Shortened PR interval   . SVT (supraventricular tachycardia) ([facility])   . Thyroid disease    Past Surgical History:  Past Surgical History:  Procedure Laterality Date  . ABDOMINAL HYSTERECTOMY    . BREAST EXCISIONAL BIOPSY Left pt unsure   . 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 She has never used smokeless tobacco. She reports that she does not drink alcohol or use drugs. Family History:  Family History  Problem Relation Age of Onset  . Hypertension Mother   . Breast cancer Mother 30  . Breast cancer Maternal Aunt   . Breast cancer Maternal Grandmother      HOME MEDICATIONS: Allergies as of 04/04/2019      Reactions   Amlodipine Swelling, Palpitations   Swelling, heart pounding   Penicillins Hives   Has patient had a PCN reaction causing immediate rash, facial/tongue/throat swelling, SOB or lightheadedness with hypotension: yes Has patient had a PCN reaction causing severe rash involving mucus membranes or skin necrosis: no Has patient had a PCN reaction that required hospitalization: no Has patient had a PCN reaction occurring within the last 10 years: no If all of the above answers are "NO", then may proceed with Cephalosporin   Cardizem  Cd [diltiazem] Other (See Comments)   Pt reports this med causes her fatigue, dizziness, lightheaded, sore throat, and hard swallowing meals.   Lisinopril    Patient refused prior rx due to potential side effects outlined in medication information   Losartan Other (See Comments)   Pt reports causes frequent urination and UTIs   Propranolol Other (See Comments)   Pt reports causes flushing    Ciprofloxacin Palpitations   Simvastatin Other (See Comments)   Increased urination      Medication List       Accurate as of April 04, 2019 10:19 AM. If you have any questions,  ask your nurse or doctor.        aspirin EC 81 MG tablet Take 81 mg by mouth every other day.   cholecalciferol 25 MCG (1000 UT) tablet Commonly known as: VITAMIN D3 Take 1,000 Units by mouth daily.   Lumigan 0.01 % Soln Generic drug: bimatoprost Place 1 drop into both eyes at bedtime.   methimazole 5 MG tablet Commonly known as: TAPAZOLE Take 0.5 tablets (2.5 mg total) by mouth daily.   potassium chloride 20 MEQ/15ML (10%) Soln Take 15 mLs (20 mEq total) by mouth daily.   Xiidra 5 % Soln Generic drug: Lifitegrast Apply to eye.         OBJECTIVE:   PHYSICAL EXAM: VS: BP 132/78 (BP Location: Left Arm, Patient Position: Sitting, Cuff Size: Normal)   Pulse 64   Temp 97.8 F (36.6 C)   Ht 5\' 3"  (1.6 m)   Wt 125 lb (56.7 kg)   SpO2 97%   BMI 22.14 kg/m    EXAM: General: Pt appears well and is in NAD  Eyes: Extensive conjunctival injection, no ecchymosis, proptosis or a stare.   Neck: General: Supple without adenopathy. Thyroid: Thyroid size normal.  No goiter or nodules appreciated. No thyroid bruit.  Lungs: Clear with good BS bilat with no rales, rhonchi, or wheezes  Heart: Auscultation: RRR.  Abdomen: Normoactive bowel sounds, soft, nontender, without masses or organomegaly palpable  Extremities:  BL LE: No pretibial edema normal ROM and strength.  Mental Status: Judgment, insight: Intact Orientation: Oriented to time, place, and person Mood and affect: No depression, anxiety, or agitation     DATA REVIEWED: Results for Victoria Cardenas (MRN [id number]) as of 04/04/2019 15:29  Ref. Range 01/20/2019 10:19 03/04/2019 09:24 04/04/2019 10:34  TSH Latest Ref Range: 0.35 - 4.50 uIU/mL 3.08 1.13 1.53  T4,Free(Direct) Latest Ref Range: 0.60 - 1.60 ng/dL 0.89 0.91 0.76     Results for Victoria Cardenas (MRN [id number]) as of 10/20/2018 09:48  Ref. Range 09/10/2018 11:13  TRAB Latest Ref Range: <=2.00 IU/L 3.34 (H)     ASSESSMENT / PLAN / RECOMMENDATIONS:    Subclinical Hyperthyroidism Secondary to Graves' Disease:  - We again discussed the risk of subclinical hyperthyroidism and its association with an increased risk of atrial fibrillation, mortality and, primarily in postmenopausal women, a decrease in bone mineral density. - She is tolerating methimazole well.  - She is clinically euthyroid  - Repeat TFT's today are normal   Medications  Continue  Methimazole 2.5 mg daily     2. Multinodular Goiter   - No local neck symptoms  - She is S/P benign FNA of the right mid nodule in 2018 , other sub-centimeter nodules present on ultrasound bilaterally but did not require further follow up.  - Will repeat thyroid ultrasound to confirm stability.    3. Graves' Disease:  -  No evidence of extra-thyroidal manifestations of graves' disease, pt advised to notify her ophthalmologist of this diagnosis.    Labs in 6 weeks    F/u in 3 month    Signed electronically by: Mack Guise, MD  [facility]  [address] [place], [place] [address] Phone: [phone] FAX: [phone]      CC: Victoria Cardenas, [place] [address] Phone: [phone]  Fax: [phone]   Return to Endocrinology clinic as below: Future Appointments  Date Time Provider [place]  04/04/2019 10:30 AM Shamleffer, Melanie Crazier, MD [facility] None  04/14/2019  9:20 AM Dorothy Spark, MD [facility]

## 2019-04-07 ENCOUNTER — Other Ambulatory Visit: Payer: Self-pay | Admitting: Cardiology

## 2019-04-11 ENCOUNTER — Other Ambulatory Visit: Payer: Self-pay | Admitting: Internal Medicine

## 2019-04-11 DIAGNOSIS — Z1231 Encounter for screening mammogram for malignant neoplasm of breast: Secondary | ICD-10-CM

## 2019-04-14 ENCOUNTER — Encounter: Payer: Self-pay | Admitting: Cardiology

## 2019-04-14 ENCOUNTER — Ambulatory Visit (INDEPENDENT_AMBULATORY_CARE_PROVIDER_SITE_OTHER): Payer: Medicare Other | Admitting: Cardiology

## 2019-04-14 ENCOUNTER — Other Ambulatory Visit: Payer: Self-pay

## 2019-04-14 VITALS — BP 134/78 | HR 59 | Ht 63.0 in | Wt 120.4 lb

## 2019-04-14 DIAGNOSIS — E0590 Thyrotoxicosis, unspecified without thyrotoxic crisis or storm: Secondary | ICD-10-CM

## 2019-04-14 DIAGNOSIS — I10 Essential (primary) hypertension: Secondary | ICD-10-CM | POA: Diagnosis not present

## 2019-04-14 LAB — COMPREHENSIVE METABOLIC PANEL
ALT: 12 IU/L (ref 0–32)
AST: 20 IU/L (ref 0–40)
Albumin/Globulin Ratio: 2 (ref 1.2–2.2)
Albumin: 4.3 g/dL (ref 3.8–4.8)
Alkaline Phosphatase: 73 IU/L (ref 39–117)
BUN/Creatinine Ratio: 14 (ref 12–28)
BUN: 11 mg/dL (ref 8–27)
Bilirubin Total: 0.5 mg/dL (ref 0.0–1.2)
CO2: 25 mmol/L (ref 20–29)
Calcium: 9 mg/dL (ref 8.7–10.3)
Chloride: 104 mmol/L (ref 96–106)
Creatinine, Ser: 0.78 mg/dL (ref 0.57–1.00)
GFR calc Af Amer: 90 mL/min/{1.73_m2} (ref >59)
GFR calc non Af Amer: 78 mL/min/{1.73_m2} (ref >59)
Globulin, Total: 2.2 g/dL (ref 1.5–4.5)
Glucose: 109 mg/dL — ABNORMAL HIGH (ref 65–99)
Potassium: 4.2 mmol/L (ref 3.5–5.2)
Sodium: 142 mmol/L (ref 134–144)
Total Protein: 6.5 g/dL (ref 6.0–8.5)

## 2019-04-14 LAB — LIPID PANEL
Chol/HDL Ratio: 5.6 ratio — ABNORMAL HIGH (ref 0.0–4.4)
Cholesterol, Total: 200 mg/dL — ABNORMAL HIGH (ref 100–199)
HDL: 36 mg/dL — ABNORMAL LOW (ref >39)
LDL Chol Calc (NIH): 140 mg/dL — ABNORMAL HIGH (ref 0–99)
Triglycerides: 133 mg/dL (ref 0–149)
VLDL Cholesterol Cal: 24 mg/dL (ref 5–40)

## 2019-04-14 LAB — CBC
Hematocrit: 43.1 % (ref 34.0–46.6)
Hemoglobin: 14.6 g/dL (ref 11.1–15.9)
MCH: 31.1 pg (ref 26.6–33.0)
MCHC: 33.9 g/dL (ref 31.5–35.7)
MCV: 92 fL (ref 79–97)
Platelets: 221 10*3/uL (ref 150–450)
RBC: 4.69 x10E6/uL (ref 3.77–5.28)
RDW: 11.9 % (ref 11.7–15.4)
WBC: 5.2 10*3/uL (ref 3.4–10.8)

## 2019-04-14 NOTE — Progress Notes (Signed)
Cardiology Office Note:    Date:  04/14/2019   ID:  Victoria Cardenas, DOB Sep 28, 1950, MRN [id number]  PCP:  Nolene Ebbs, MD  Cardiologist:  Ena Dawley, MD  Electrophysiologist:  None   Referring MD: Nolene Ebbs, MD   Chief complain: Palpitations  History of Present Illness:    Victoria Cardenas is a 68 y.o. female with a hx of hypertension, hyperlipidemia, glaucoma, mild MR, short PR, subclinical hypothyroidism.  She has been started having palpitations associated with dizziness following a recent URI.  ZIO patch showed a sinus tachycardia and one episode of 9 beats of ventricular tachycardia.  She had 43 episodes of SVT.  It was thought that this was due to her hyperthyroidism.  She was started on methimazole.  She did not tolerate propranolol due to bradycardia and flushing and was started on metoprolol.  She not tolerate metoprolol and was started on diltiazem and holding losartan and metoprolol.  She is unfortunately continued to have palpitations.  She is on no rate controlling medications at this time.  11/08/2018 -the patient is coming for follow-up, she has restarted methimazole with normalization of TSH and now significant improvement in symptoms.  She only gets occasional palpitations that have no associated symptoms such as dizziness or syncope.  She denies any chest pain.  She reports different side effects from hydralazine but is willing to continue it.  04/14/2019 -patient is coming after 5 months, she has been feeling very well, denies any palpitation dizziness no chest pain or shortness of breath.  She follows with her endocrinologist and her methimazole has been cut down to just 2.5 mg daily.  She has no side effects from that.  Her TSH was just checked on April 04, 2019 and it was normal as well as free T4.  Past Medical History:  Diagnosis Date  . Breast cancer ([place]) 2005   right breast  . Glaucoma   . Hypercholesteremia   . Hypertension   . Medication  intolerance    multiple  . Mild mitral regurgitation   . NSVT (nonsustained ventricular tachycardia) ([place])   . Shortened PR interval   . SVT (supraventricular tachycardia) ([facility])   . Thyroid disease     Past Surgical History:  Procedure Laterality Date  . ABDOMINAL HYSTERECTOMY    . BREAST EXCISIONAL BIOPSY Left pt unsure   . 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 aspirin EC 81 MG tablet Take 81 mg by mouth every other day.  . cholecalciferol (VITAMIN D3) 25 MCG (1000 UT) tablet Take 1,000 Units by mouth daily.  Marland Kitchen LUMIGAN 0.01 % SOLN Place 1 drop into both eyes at bedtime.  . methimazole (TAPAZOLE) 5 MG tablet Take 0.5 tablets (2.5 mg total) by mouth daily.  . potassium chloride 20 MEQ/15ML (10%) SOLN Take 15 mLs (20 mEq total) by mouth daily.     Allergies:   Amlodipine, Penicillins, Cardizem cd [diltiazem], Lisinopril, Losartan, Propranolol, Ciprofloxacin, and Simvastatin   Social History   Socioeconomic History  . Marital status: Single    Spouse name: Not on file  . Number of children: Not on file  . Years of education: Not on file  . Highest education level: Not on file  Occupational History  . Not on file  Tobacco Use  . Smoking status: Never Smoker  . Smokeless tobacco: Never Used  Substance and Sexual Activity  . Alcohol use: No  . Drug  use: No  . Sexual activity: Not on file  Other Topics Concern  . Not on file  Social History Narrative  . Not on file   Social Determinants of Health   Financial Resource Strain:   . Difficulty of Paying Living Expenses: Not on file  Food Insecurity:   . Worried About Charity fundraiser in the Last Year: Not on file  . Ran Out of Food in the Last Year: Not on file  Transportation Needs:   . Lack of Transportation (Medical): Not on file  . Lack of Transportation (Non-Medical): Not on file  Physical Activity:   . Days of Exercise per Week: Not on file  . Minutes  of Exercise per Session: Not on file  Stress:   . Feeling of Stress : Not on file  Social Connections:   . Frequency of Communication with Friends and Family: Not on file  . Frequency of Social Gatherings with Friends and Family: Not on file  . Attends Religious Services: Not on file  . Active Member of Clubs or Organizations: Not on file  . Attends Archivist Meetings: Not on file  . Marital Status: Not on file     Family History: The patient's family history includes Breast cancer in her maternal aunt and maternal grandmother; Breast cancer (age of onset: 89) in her mother; Hypertension in her mother.  ROS:   Please see the history of present illness.    All other systems reviewed and are negative.  EKGs/Labs/Other Studies Reviewed:    The following studies were reviewed today:  EKG:  EKG is ordered today.  The ekg ordered today demonstrates sinus bradycardia, otherwise normal EKG.  Recent Labs: 08/06/2018: ALT 16 09/20/2018: Hemoglobin 14.7; Platelets 219 10/05/2018: Magnesium 1.9; NT-Pro BNP 80 12/13/2018: BUN 9; Creatinine, Ser 0.83; Potassium 3.9; Sodium 142 04/04/2019: TSH 1.53  Recent Lipid Panel No results found for: CHOL, TRIG, HDL, CHOLHDL, VLDL, LDLCALC, LDLDIRECT  Physical Exam:    VS:  BP 134/78   Pulse (!) 59   Ht '5\' 3"'$  (1.6 m)   Wt 120 lb 6.4 oz (54.6 kg)   SpO2 98%   BMI 21.33 kg/m     Wt Readings from Last 3 Encounters:  04/14/19 120 lb 6.4 oz (54.6 kg)  04/04/19 125 lb (56.7 kg)  01/20/19 124 lb 9.6 oz (56.5 kg)     GEN: Well nourished, well developed in no acute distress HEENT: Normal NECK: No JVD; No carotid bruits LYMPHATICS: No lymphadenopathy CARDIAC: RRR, no murmurs, rubs, gallops RESPIRATORY:  Clear to auscultation without rales, wheezing or rhonchi  ABDOMEN: Soft, non-tender, non-distended MUSCULOSKELETAL:  No edema; No deformity  SKIN: Warm and dry NEUROLOGIC:  Alert and oriented x 3 PSYCHIATRIC:  Normal affect    ASSESSMENT:    1. Hyperthyroidism   2. Essential hypertension    PLAN:    In order of problems listed above:  1. SVT -significantly improved with methimazole, no additional therapy needed right now.  Heart rate always in 50s, no dizziness. 2. NSVT -seems to be resolved now with treatment of hyperthyroidism.  Her LVEF is normal, no further work-up needed. 3.  Hypertension-controlled  Follow-up in 1 year, we will obtain labs today including lipids as we do not have any in her system.  I will also obtain kidney and liver function as well as potassium as she is on chronic potassium supplements.  Medication Adjustments/Labs and Tests Ordered: Current medicines are reviewed at length with the  patient today.  Concerns regarding medicines are outlined above.  Orders Placed This Encounter  Procedures  . Comp Met (CMET)  . Lipid panel  . CBC   No orders of the defined types were placed in this encounter.   Patient Instructions  Medication Instructions:  Your physician recommends that you continue on your current medications as directed. Please refer to the Current Medication list given to you today.  *If you need a refill on your cardiac medications before your next appointment, please call your pharmacy*  Lab Work: CMET, Lipid and CBC today  If you have labs (blood work) drawn today and your tests are completely normal, you will receive your results only by: Marland Kitchen MyChart Message (if you have MyChart) OR . A paper copy in the mail If you have any lab test that is abnormal or we need to change your treatment, we will call you to review the results.  Testing/Procedures: None  Follow-Up: At [facility], you and your health needs are our priority.  As part of our continuing mission to provide you with exceptional heart care, we have created designated Provider Care Teams.  These Care Teams include your primary Cardiologist (physician) and Advanced Practice Providers (APPs -   Physician Assistants and Nurse Practitioners) who all work together to provide you with the care you need, when you need it.  Your next appointment:   12 month(s)  The format for your next appointment:   In Person  Provider:   You may see Ena Dawley, MD or one of the following Advanced Practice Providers on your designated Care Team:    Melina Copa, PA-C  Ermalinda Barrios, PA-C   Other Instructions      Signed, Ena Dawley, MD  04/14/2019 9:44 AM    [place]

## 2019-04-14 NOTE — Patient Instructions (Signed)
Medication Instructions:  Your physician recommends that you continue on your current medications as directed. Please refer to the Current Medication list given to you today.  *If you need a refill on your cardiac medications before your next appointment, please call your pharmacy*  Lab Work: CMET, Lipid and CBC today  If you have labs (blood work) drawn today and your tests are completely normal, you will receive your results only by: Marland Kitchen MyChart Message (if you have MyChart) OR . A paper copy in the mail If you have any lab test that is abnormal or we need to change your treatment, we will call you to review the results.  Testing/Procedures: None  Follow-Up: At [facility], you and your health needs are our priority.  As part of our continuing mission to provide you with exceptional heart care, we have created designated Provider Care Teams.  These Care Teams include your primary Cardiologist (physician) and Advanced Practice Providers (APPs -  Physician Assistants and Nurse Practitioners) who all work together to provide you with the care you need, when you need it.  Your next appointment:   12 month(s)  The format for your next appointment:   In Person  Provider:   You may see Ena Dawley, MD or one of the following Advanced Practice Providers on your designated Care Team:    Melina Copa, PA-C  Ermalinda Barrios, PA-C   Other Instructions

## 2019-04-15 ENCOUNTER — Telehealth: Payer: Self-pay | Admitting: *Deleted

## 2019-04-15 MED ORDER — ROSUVASTATIN CALCIUM 5 MG PO TABS: 5.0000 mg | ORAL_TABLET | Freq: Every day | ORAL | 1 refills | Status: DC

## 2019-04-15 NOTE — Telephone Encounter (Signed)
Spoke with the pt and informed her that per Dr. [name], her kidney and liver function were normal, her cbc was normal, but her LDL was elevated, so she recommends that she start low dose rosuvastatin 5 mg po daily. Confirmed the pharmacy of choice with the pt.  Informed the pt that I will only send in a months supply, to make sure she tolerates this appropriately.  Pt verbalized understanding and agrees with this plan.

## 2019-04-15 NOTE — Telephone Encounter (Signed)
-----   Message from Dorothy Spark, MD sent at 04/14/2019 10:24 PM EST ----- Normal kidney, liver, function, normal CBC, elevated LDL, I would start rosuvastatin 5 mg po daily

## 2019-05-03 ENCOUNTER — Ambulatory Visit
Admission: RE | Admit: 2019-05-03 | Discharge: 2019-05-03 | Disposition: A | Discharge status: Home / Self Care | Payer: Medicare Other | Source: Ambulatory Visit | Attending: Internal Medicine | Admitting: Internal Medicine

## 2019-05-03 DIAGNOSIS — E0590 Thyrotoxicosis, unspecified without thyrotoxic crisis or storm: Secondary | ICD-10-CM

## 2019-05-04 ENCOUNTER — Encounter: Payer: Self-pay | Admitting: Internal Medicine

## 2019-05-12 ENCOUNTER — Other Ambulatory Visit: Payer: Self-pay | Admitting: Cardiology

## 2019-05-18 ENCOUNTER — Other Ambulatory Visit: Payer: Medicare Other

## 2019-05-23 ENCOUNTER — Encounter: Payer: Self-pay | Admitting: Internal Medicine

## 2019-05-23 ENCOUNTER — Other Ambulatory Visit (INDEPENDENT_AMBULATORY_CARE_PROVIDER_SITE_OTHER): Payer: Medicare Other

## 2019-05-23 ENCOUNTER — Other Ambulatory Visit: Payer: Self-pay

## 2019-05-23 DIAGNOSIS — E0590 Thyrotoxicosis, unspecified without thyrotoxic crisis or storm: Secondary | ICD-10-CM | POA: Diagnosis not present

## 2019-05-23 LAB — T4, FREE: Free T4: 0.79 ng/dL (ref 0.60–1.60)

## 2019-05-23 LAB — TSH: TSH: 1.6 u[IU]/mL (ref 0.35–4.50)

## 2019-05-30 ENCOUNTER — Ambulatory Visit
Admission: RE | Admit: 2019-05-30 | Discharge: 2019-05-30 | Disposition: A | Discharge status: Home / Self Care | Payer: Medicare Other | Source: Ambulatory Visit | Attending: Internal Medicine | Admitting: Internal Medicine

## 2019-05-30 ENCOUNTER — Other Ambulatory Visit: Payer: Self-pay

## 2019-05-30 DIAGNOSIS — Z1231 Encounter for screening mammogram for malignant neoplasm of breast: Secondary | ICD-10-CM

## 2019-07-01 ENCOUNTER — Other Ambulatory Visit: Payer: Self-pay

## 2019-07-05 ENCOUNTER — Ambulatory Visit (INDEPENDENT_AMBULATORY_CARE_PROVIDER_SITE_OTHER): Payer: Medicare Other | Admitting: Internal Medicine

## 2019-07-05 ENCOUNTER — Encounter: Payer: Self-pay | Admitting: Internal Medicine

## 2019-07-05 ENCOUNTER — Other Ambulatory Visit: Payer: Self-pay

## 2019-07-05 VITALS — BP 124/82 | HR 60 | Temp 98.7°F | Ht 63.0 in | Wt 124.0 lb

## 2019-07-05 DIAGNOSIS — E042 Nontoxic multinodular goiter: Secondary | ICD-10-CM | POA: Diagnosis not present

## 2019-07-05 DIAGNOSIS — E0500 Thyrotoxicosis with diffuse goiter without thyrotoxic crisis or storm: Secondary | ICD-10-CM

## 2019-07-05 LAB — T4, FREE: Free T4: 0.91 ng/dL (ref 0.60–1.60)

## 2019-07-05 LAB — TSH: TSH: 1.62 u[IU]/mL (ref 0.35–4.50)

## 2019-07-05 NOTE — Progress Notes (Signed)
Name: Victoria Cardenas  MRN/ DOB: [id number], [birth year]    Age/ Sex: 69 y.o., female     PCP: Victoria Ebbs, MD   Reason for Endocrinology Evaluation: Subclinical hyperthyroidism     Initial Endocrinology Clinic Visit: 08/25/2018    PATIENT IDENTIFIER: Victoria Cardenas is a 69 y.o., female with a past medical history of HTN, Hx of breast cancer (S/P lumpectomy ) and Osteoporosis   . She has followed with [facility] since 08/25/2018 for consultative assistance with management of her Subclinical hyperthyroidism.   HISTORICAL SUMMARY:   Pt was noted with low TSH in 11/2016, at the time she presented to the ED for palpitations.   In April, 2020 she presented again with palpitations and sob and was found to have a TSH of 0.056 uIU/mL with normal T3 and T4. This was attributed to Graves' Disease due to elevated TRAb level of 3.34 IU/L.   - She is S/P benign FNA of the right mid nodule in 2018 , other sub-centimeter nodules present on ultrasound bilaterally but did not require further follow up.  -Repeat thyroid ultrasound in 04/2019 showed stable right mid nodule at 2.9 cm   No FH of thyroid disease.    Osteoporosis : She was diagnosed with osteoporosis 2017, and was started on boniva at the time, but pt stopped it due to palpitations and diarrhea ~ 2 yrs ago.  Currently she is only on Calcium and Vitamin D 2 tablets daily  She declines antiresorptive therapy    SUBJECTIVE:   During last visit (05/23/2019): We continued  Methimazole dose.   Today (07/05/2019):  Victoria Cardenas is here for a  3 months follow up visit on subclinical hyperthyroidism secondary to Graves' Disease.  She has been compliant with methimazole intake at 2.5 mg daily   She rarely has palpitations and no sob  She denies any side effects to methimazole  Denies any local neck symptoms  Has chronic dry eyes and glaucoma, S/P treatment last month of the right eye  Weight has been stable      Medications: Methimazole 2.5 mg daily    ROS:  As per HPI.   HISTORY:  Past Medical History:  Past Medical History:  Diagnosis Date  . Breast cancer ([place]) 2005   right breast  . Glaucoma   . Hypercholesteremia   . Hypertension   . Medication intolerance    multiple  . Mild mitral regurgitation   . NSVT (nonsustained ventricular tachycardia) ([facility])   . Shortened PR interval   . SVT (supraventricular tachycardia) ([facility])   . Thyroid disease    Past Surgical History:  Past Surgical History:  Procedure Laterality Date  . ABDOMINAL HYSTERECTOMY    . BREAST EXCISIONAL BIOPSY Left pt unsure   . 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 She has never used smokeless tobacco. She reports that she does not drink alcohol or use drugs. Family History:  Family History  Problem Relation Age of Onset  . Hypertension Mother   . Breast cancer Mother 106  . Breast cancer Maternal Aunt   . Breast cancer Maternal Grandmother      HOME MEDICATIONS: Allergies as of 07/05/2019      Reactions   Amlodipine Swelling, Palpitations   Swelling, heart pounding   Penicillins Hives   Has patient had a PCN reaction causing immediate rash, facial/tongue/throat swelling, SOB or lightheadedness with hypotension: yes Has patient  had a PCN reaction causing severe rash involving mucus membranes or skin necrosis: no Has patient had a PCN reaction that required hospitalization: no Has patient had a PCN reaction occurring within the last 10 years: no If all of the above answers are "NO", then may proceed with Cephalosporin   Cardizem Cd [diltiazem] Other (See Comments)   Pt reports this med causes her fatigue, dizziness, lightheaded, sore throat, and hard swallowing meals.   Lisinopril    Patient refused prior rx due to potential side effects outlined in medication information   Losartan Other (See Comments)   Pt reports causes frequent urination and  UTIs   Propranolol Other (See Comments)   Pt reports causes flushing    Ciprofloxacin Palpitations   Simvastatin Other (See Comments)   Increased urination      Medication List       Accurate as of July 05, 2019 10:51 AM. If you have any questions, ask your nurse or doctor.        aspirin EC 81 MG tablet Take 81 mg by mouth every other day.   cholecalciferol 25 MCG (1000 UNIT) tablet Commonly known as: VITAMIN D3 Take 1,000 Units by mouth daily.   Lumigan 0.01 % Soln Generic drug: bimatoprost Place 1 drop into both eyes at bedtime.   methimazole 5 MG tablet Commonly known as: TAPAZOLE Take 0.5 tablets (2.5 mg total) by mouth daily.   potassium chloride 20 MEQ/15ML (10%) Soln Take 15 mLs (20 mEq total) by mouth daily.   rosuvastatin 5 MG tablet Commonly known as: CRESTOR Take 1 tablet (5 mg total) by mouth daily.         OBJECTIVE:   PHYSICAL EXAM: VS: BP 124/82 (BP Location: Right Arm, Patient Position: Sitting, Cuff Size: Normal)   Pulse 60   Temp 98.7 F (37.1 C)   Ht 5\' 3"  (1.6 m)   Wt 124 lb (56.2 kg)   SpO2 98%   BMI 21.97 kg/m    EXAM: General: Pt appears well and is in NAD  Eyes: Extensive conjunctival injection, no ecchymosis, proptosis or a stare.   Neck: General: Supple without adenopathy. Thyroid: Thyroid size normal.  No goiter or nodules appreciated. No thyroid bruit.  Lungs: Clear with good BS bilat with no rales, rhonchi, or wheezes  Heart: Auscultation: RRR.  Abdomen: Normoactive bowel sounds, soft, nontender, without masses or organomegaly palpable  Extremities:  BL LE: No pretibial edema normal ROM and strength.  Mental Status: Judgment, insight: Intact Orientation: Oriented to time, place, and person Mood and affect: No depression, anxiety, or agitation     DATA REVIEWED:  Results for Victoria, Cardenas (MRN [id number]) as of 07/05/2019 16:24  Ref. Range 07/05/2019 10:39  TSH Latest Ref Range: 0.35 - 4.50 uIU/mL 1.62   T4,Free(Direct) Latest Ref Range: 0.60 - 1.60 ng/dL 0.91    Results for TROYLENE, Cardenas (MRN [id number]) as of 10/20/2018 09:48  Ref. Range 09/10/2018 11:13  TRAB Latest Ref Range: <=2.00 IU/L 3.34 (H)     ASSESSMENT / PLAN / RECOMMENDATIONS:   Subclinical Hyperthyroidism Secondary to Graves' Disease:  - We again discussed the risk of subclinical hyperthyroidism and its association with an increased risk of atrial fibrillation, mortality and, primarily in postmenopausal women, a decrease in bone mineral density. - She is tolerating methimazole well.  - She is clinically euthyroid  - Repeat TFT's today are normal   Medications  Continue  Methimazole 2.5 mg daily     2. Multinodular  Goiter   - No local neck symptoms  - She is S/P benign FNA of the right mid nodule in 2018 , other sub-centimeter nodules present on ultrasound bilaterally but did not require further follow up.  -Repeat thyroid ultrasound in 04/2019 showed stable right mid nodule at 2.9 cm   3. Graves' Disease:  - No evidence of extra-thyroidal manifestations of graves' disease, pt advised to notify her ophthalmologist of this diagnosis.     F/u in 4 month    Signed electronically by: Mack Guise, MD  [facility]  [facility] [address] [place],  [address] Phone: [phone] FAX: [phone]      CC: Victoria Cardenas, [place] [address] Phone: [phone]  Fax: [phone]   Return to Endocrinology clinic as below: Future Appointments  Date Time Provider [place]  11/04/2019 10:30 AM Raechel Marcos, Melanie Crazier, MD [facility] None

## 2019-07-05 NOTE — Patient Instructions (Signed)
-   Continue Methimazole HALF a tablet daily  

## 2019-11-04 ENCOUNTER — Ambulatory Visit (INDEPENDENT_AMBULATORY_CARE_PROVIDER_SITE_OTHER): Payer: Medicare Other | Admitting: Internal Medicine

## 2019-11-04 ENCOUNTER — Other Ambulatory Visit: Payer: Self-pay

## 2019-11-04 ENCOUNTER — Encounter: Payer: Self-pay | Admitting: Internal Medicine

## 2019-11-04 VITALS — BP 144/80 | HR 62 | Ht 63.0 in | Wt 122.2 lb

## 2019-11-04 DIAGNOSIS — E042 Nontoxic multinodular goiter: Secondary | ICD-10-CM | POA: Diagnosis not present

## 2019-11-04 DIAGNOSIS — E0590 Thyrotoxicosis, unspecified without thyrotoxic crisis or storm: Secondary | ICD-10-CM | POA: Diagnosis not present

## 2019-11-04 DIAGNOSIS — E0500 Thyrotoxicosis with diffuse goiter without thyrotoxic crisis or storm: Secondary | ICD-10-CM

## 2019-11-04 LAB — TSH: TSH: 2.24 u[IU]/mL (ref 0.35–4.50)

## 2019-11-04 LAB — T4, FREE: Free T4: 0.9 ng/dL (ref 0.60–1.60)

## 2019-11-04 NOTE — Progress Notes (Signed)
Name: Victoria Cardenas  MRN/ DOB: [id number], May 30, 1950    Age/ Sex: 69 y.o., female     PCP: Nolene Ebbs, MD   Reason for Endocrinology Evaluation: Subclinical hyperthyroidism     Initial Endocrinology Clinic Visit: 08/25/2018    PATIENT IDENTIFIER: [name] is a 69 y.o., female with a past medical history of HTN, Hx of breast cancer (S/P lumpectomy ) and Osteoporosis   . She has followed with [facility] since 08/25/2018 for consultative assistance with management of her Subclinical hyperthyroidism.   HISTORICAL SUMMARY:   Pt was noted with low TSH in 11/2016, at the time she presented to the ED for palpitations.   In April, 2020 she presented again with palpitations and sob and was found to have a TSH of 0.056 uIU/mL with normal T3 and T4. This was attributed to Graves' Disease due to elevated TRAb level of 3.34 IU/L.   - She is S/P benign FNA of the right mid nodule in 2018 , other sub-centimeter nodules present on ultrasound bilaterally but did not require further follow up.  -Repeat thyroid ultrasound in 04/2019 showed stable right mid nodule at 2.9 cm   No FH of thyroid disease.    Osteoporosis : She was diagnosed with osteoporosis 2017, and was started on boniva at the time, but pt stopped it due to palpitations and diarrhea ~ 2 yrs ago.  Currently she is only on Calcium and Vitamin D 2 tablets daily  She declines antiresorptive therapy    SUBJECTIVE:    Today (11/04/2019):  Ms. [name] is here for a  4 months follow up visit on subclinical hyperthyroidism secondary to Graves' Disease.    She denies palpitations and no sob  She denies any side effects to methimazole  Denies any local neck symptoms  Weight has been stable  Has occasional diarrhea alternating with constipation     Medications: Methimazole 2.5 mg daily    ROS:  As per HPI.   HISTORY:  Past Medical History:  Past Medical History:  Diagnosis Date  . Breast  cancer ([facility]) 2005   right breast  . Glaucoma   . Hypercholesteremia   . Hypertension   . Medication intolerance    multiple  . Mild mitral regurgitation   . NSVT (nonsustained ventricular tachycardia) ([facility])   . Shortened PR interval   . SVT (supraventricular tachycardia) ([facility])   . Thyroid disease    Past Surgical History:  Past Surgical History:  Procedure Laterality Date  . ABDOMINAL HYSTERECTOMY    . BREAST EXCISIONAL BIOPSY Left pt unsure   . 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 She has never used smokeless tobacco. She reports that she does not drink alcohol and does not use drugs. Family History:  Family History  Problem Relation Age of Onset  . Hypertension Mother   . Breast cancer Mother 75  . Breast cancer Maternal Aunt   . Breast cancer Maternal Grandmother      HOME MEDICATIONS: Allergies as of 11/04/2019      Reactions   Amlodipine Swelling, Palpitations   Swelling, heart pounding   Penicillins Hives   Has patient had a PCN reaction causing immediate rash, facial/tongue/throat swelling, SOB or lightheadedness with hypotension: yes Has patient had a PCN reaction causing severe rash involving mucus membranes or skin necrosis: no Has patient had a PCN reaction that required hospitalization: no Has patient had a  PCN reaction occurring within the last 10 years: no If all of the above answers are "NO", then may proceed with Cephalosporin   Cardizem Cd [diltiazem] Other (See Comments)   Pt reports this med causes her fatigue, dizziness, lightheaded, sore throat, and hard swallowing meals.   Lisinopril    Patient refused prior rx due to potential side effects outlined in medication information   Losartan Other (See Comments)   Pt reports causes frequent urination and UTIs   Propranolol Other (See Comments)   Pt reports causes flushing    Ciprofloxacin Palpitations   Simvastatin Other (See Comments)   Increased  urination      Medication List       Accurate as of November 04, 2019 10:25 AM. If you have any questions, ask your nurse or doctor.        aspirin EC 81 MG tablet Take 81 mg by mouth every other day.   cholecalciferol 25 MCG (1000 UNIT) tablet Commonly known as: VITAMIN D3 Take 1,000 Units by mouth daily.   Lumigan 0.01 % Soln Generic drug: bimatoprost Place 1 drop into both eyes at bedtime.   methimazole 5 MG tablet Commonly known as: TAPAZOLE Take 0.5 tablets (2.5 mg total) by mouth daily.   potassium chloride 20 MEQ/15ML (10%) Soln Take 15 mLs (20 mEq total) by mouth daily.   rosuvastatin 5 MG tablet Commonly known as: CRESTOR Take 1 tablet (5 mg total) by mouth daily.         OBJECTIVE:   PHYSICAL EXAM: VS: BP (!) 144/80 (BP Location: Left Arm, Patient Position: Sitting, Cuff Size: Normal)   Pulse 62   Ht 5\' 3"  (1.6 m)   Wt 122 lb 3.2 oz (55.4 kg)   SpO2 99%   BMI 21.65 kg/m    EXAM: General: Pt appears well and is in NAD  Eyes: Extensive conjunctival injection, no ecchymosis, proptosis or a stare.   Neck: General: Supple without adenopathy. Thyroid: Thyroid size normal.  No goiter or nodules appreciated. No thyroid bruit.  Lungs: Clear with good BS bilat with no rales, rhonchi, or wheezes  Heart: Auscultation: RRR.  Abdomen: Normoactive bowel sounds, soft, nontender, without masses or organomegaly palpable  Extremities:  BL LE: No pretibial edema normal ROM and strength.  Mental Status: Judgment, insight: Intact Orientation: Oriented to time, place, and person Mood and affect: No depression, anxiety, or agitation     DATA REVIEWED:  Results for OZZIE, REMMERS (MRN [id number]) as of 11/04/2019 13:03  Ref. Range 11/04/2019 10:46  TSH Latest Ref Range: 0.35 - 4.50 uIU/mL 2.24  T4,Free(Direct) Latest Ref Range: 0.60 - 1.60 ng/dL 0.90     Results for KRISTAL, PERL (MRN [id number]) as of 10/20/2018 09:48  Ref. Range 09/10/2018 11:13  TRAB Latest  Ref Range: <=2.00 IU/L 3.34 (H)   Thyroid Ultrasound 05/03/2019   Multiple small thyroid nodules are noted bilaterally, all of which measure less than 1 cm and are essentially stable from prior study. The dominant 2.9 cm thyroid nodule in the right mid thyroid gland is stable from prior study and was previously biopsy. There are no new concerning thyroid nodules bilaterally. There are no pathologically enlarged lymph nodes. There is a new 0.8 cm thyroid nodule in the mid isthmus that does not meet criteria for follow-up or FNA.  IMPRESSION: 1. Borderline enlarged multinodular thyroid gland as detailed above. 2. Stable dominant 2.9 cm thyroid nodule in the right mid thyroid gland that was previously biopsied. 3.  Additional small subcentimeter essentially stable thyroid nodules, none of which meet criteria for follow-up.    FNA RMP nodule 2.9x1.4x1.8 (11/27/2015)   CONSISTENT WITH BENIGN FOLLICULAR NODULE (BETHESDA CATEGORY II).  ASSESSMENT / PLAN / RECOMMENDATIONS:   Subclinical Hyperthyroidism Secondary to Graves' Disease:  - Patient is clinically euthyroid - She is tolerating methimazole well.  -No local neck symptoms - Repeat TFT's today continue to be normal    Medications  Continue  Methimazole 2.5 mg daily     2. Multinodular Goiter   - No local neck symptoms  - She is S/P benign FNA of the right mid nodule in 2017 , other sub-centimeter nodules present on ultrasound bilaterally but did not require further follow up.  -Repeat thyroid ultrasound in 04/2019 showed stable right mid nodule at 2.9 cm - Will repeat in 04/2020   3. Graves' Disease:  - No evidence of extra-thyroidal manifestations of graves' disease, pt advised to notify her ophthalmologist of this diagnosis.     F/u in 6 month    Addendum: Pt made aware of normal TFTs as above on 11/04/2019 at 1300  Signed electronically by: Mack Guise, MD  [facility]  [facility] [address] [place], [place] [address] Phone: [phone] FAX: [phone]      CC: Nolene Ebbs, [place] [address] Phone: [phone]  Fax: [phone]   Return to Endocrinology clinic as below: Future Appointments  Date Time Provider [place]  11/04/2019 10:30 AM Sharyl Panchal, Melanie Crazier, MD [facility] None

## 2019-11-04 NOTE — Patient Instructions (Signed)
-   Continue Methimazole HALF a tablet daily

## 2019-11-05 ENCOUNTER — Other Ambulatory Visit: Payer: Self-pay | Admitting: Cardiology

## 2019-11-07 ENCOUNTER — Telehealth: Payer: Self-pay | Admitting: Cardiology

## 2019-11-07 NOTE — Telephone Encounter (Signed)
Called the pt to schedule her an appt with Dr. [name] in Dec. Pt can also have labs done same day as December follow-up appt.  We will order them at that visit.  While talking with the pt she asked if she could call me back, for she is in the grocery store at this time. Informed the pt that she can call me back today, when its convenient for her. Will await pts call back to arrange follow-up appt with Dr. [name], due in December 2021.

## 2019-11-07 NOTE — Telephone Encounter (Signed)
Patient is inquiring about whether or not she needs to have lab work completed prior to next appointment. Made patient aware she is due in December, 2021, however Dr. Francesca Oman schedule for December has not yet been released. Please call to discuss lab work.

## 2019-11-07 NOTE — Telephone Encounter (Signed)
Called the pt and scheduled her one year follow-up with Dr. [name] for due date of 12/14 at 0900.  Pt opted for this date and time, and request if needed, to have labs drawn at that appt, for she has to arrange transportation every time she reports to our office.  Pt states she just wanted to run a medication stop on her behalf, by Dr. [name], so that she is aware of change on 12/14 appt.  Pt states she stopped taking rosuvastatin months ago, for it caused her to feel dizzy, lightheaded, and nauseated.  Pt states when she stopped it, her side effects went away. Pt states she does not wish to be switched to a different regimen at this time, she would like to discuss this more in person with Dr. [name] at her yearly appt on 12/14, and have changes made at that visit, if necessary.  Informed the pt that I will pass this message along to Dr. [name], and make her aware of rosuvastatin issue, and her decision to remain off any statin or other medications for this, until she see's Dr.Nelson in clinic for her yearly on 12/14. Discontinued this med from her list and updated this as an intolerance.  Advised the pt that when she comes for her visit on 12/14, she should come fasting to this OV, for Dr. [name] will more than likely check fasting lipids on her at that time.  Pt verbalized understanding and agrees with this plan. Pt was more than gracious for all the assistance provided.

## 2019-11-07 NOTE — Telephone Encounter (Signed)
Patient is returning call to schedule follow up appointment with Dr. [name] in December, 2021. However, I am not able to schedule for December, 2021. I show Dr. Francesca Oman schedule has not yet been released. Made patient aware I would have the nurse return her call.

## 2019-11-16 ENCOUNTER — Other Ambulatory Visit: Payer: Self-pay | Admitting: Student

## 2019-11-16 DIAGNOSIS — R2232 Localized swelling, mass and lump, left upper limb: Secondary | ICD-10-CM

## 2019-12-02 ENCOUNTER — Ambulatory Visit
Admission: RE | Admit: 2019-12-02 | Discharge: 2019-12-02 | Disposition: A | Discharge status: Home / Self Care | Payer: Medicare Other | Source: Ambulatory Visit | Attending: Student | Admitting: Student

## 2019-12-02 ENCOUNTER — Other Ambulatory Visit: Payer: Self-pay

## 2019-12-02 DIAGNOSIS — R2232 Localized swelling, mass and lump, left upper limb: Secondary | ICD-10-CM

## 2019-12-05 ENCOUNTER — Other Ambulatory Visit: Payer: Self-pay | Admitting: Internal Medicine

## 2019-12-05 MED ORDER — METHIMAZOLE 5 MG PO TABS: 2.5000 mg | ORAL_TABLET | Freq: Every day | ORAL | 3 refills | Status: DC

## 2019-12-05 NOTE — Telephone Encounter (Signed)
Medication Refill Request  Did you call your pharmacy and request this refill first? Yes   If patient has not contacted pharmacy first, instruct them to do so for future refills.   Remind them that contacting the pharmacy for their refill is the quickest method to get the refill.   Refill policy also stated that it will take anywhere between 24-72 hours to receive the refill.    Name of medication? methimazole  Is this a 90 day supply? yes  Name and location of pharmacy?   [facility], [place]  [address] [place] [address]  Phone:  [phone] Fax:  [phone]

## 2019-12-05 NOTE — Telephone Encounter (Signed)
RX sent

## 2019-12-23 ENCOUNTER — Telehealth: Payer: Self-pay | Admitting: Internal Medicine

## 2019-12-23 NOTE — Telephone Encounter (Signed)
Patient requests to be called at ph# [phone] re: Patient states she has a dry, scratchy throat and would like to know what would be safe to take for the symptoms (due to her thyroid).

## 2019-12-23 NOTE — Telephone Encounter (Signed)
Clarified this message with Dr. Kelton Pillar in person. She did state that the pt could increase fluid intake and drink warm liquids. She also advised seeing PCP because it should not be related to pt's thyroid. Pt was given this message, and she verbalized understanding of this.

## 2020-01-09 ENCOUNTER — Other Ambulatory Visit: Payer: Self-pay | Admitting: Student

## 2020-01-09 DIAGNOSIS — N644 Mastodynia: Secondary | ICD-10-CM

## 2020-02-06 ENCOUNTER — Other Ambulatory Visit: Payer: Self-pay | Admitting: Cardiology

## 2020-03-06 ENCOUNTER — Emergency Department (HOSPITAL_COMMUNITY)
Admission: EM | Admit: 2020-03-06 | Discharge: 2020-03-06 | Disposition: A | Discharge status: Home / Self Care | Payer: Medicare Other | Attending: Emergency Medicine | Admitting: Emergency Medicine

## 2020-03-06 ENCOUNTER — Other Ambulatory Visit: Payer: Self-pay

## 2020-03-06 ENCOUNTER — Encounter (HOSPITAL_COMMUNITY): Payer: Self-pay | Admitting: Emergency Medicine

## 2020-03-06 ENCOUNTER — Emergency Department (HOSPITAL_COMMUNITY): Payer: Medicare Other

## 2020-03-06 DIAGNOSIS — Z853 Personal history of malignant neoplasm of breast: Secondary | ICD-10-CM | POA: Insufficient documentation

## 2020-03-06 DIAGNOSIS — R079 Chest pain, unspecified: Secondary | ICD-10-CM

## 2020-03-06 DIAGNOSIS — Z7982 Long term (current) use of aspirin: Secondary | ICD-10-CM | POA: Insufficient documentation

## 2020-03-06 DIAGNOSIS — I10 Essential (primary) hypertension: Secondary | ICD-10-CM | POA: Diagnosis not present

## 2020-03-06 LAB — CBC
HCT: 44.7 % (ref 36.0–46.0)
Hemoglobin: 14.5 g/dL (ref 12.0–15.0)
MCH: 30.9 pg (ref 26.0–34.0)
MCHC: 32.4 g/dL (ref 30.0–36.0)
MCV: 95.3 fL (ref 80.0–100.0)
Platelets: 230 10*3/uL (ref 150–400)
RBC: 4.69 MIL/uL (ref 3.87–5.11)
RDW: 11.9 % (ref 11.5–15.5)
WBC: 5.3 10*3/uL (ref 4.0–10.5)
nRBC: 0 % (ref 0.0–0.2)

## 2020-03-06 LAB — BASIC METABOLIC PANEL
Anion gap: 9 (ref 5–15)
BUN: 8 mg/dL (ref 8–23)
CO2: 29 mmol/L (ref 22–32)
Calcium: 9.2 mg/dL (ref 8.9–10.3)
Chloride: 102 mmol/L (ref 98–111)
Creatinine, Ser: 0.76 mg/dL (ref 0.44–1.00)
GFR, Estimated: >60 mL/min (ref >60)
Glucose, Bld: 102 mg/dL — ABNORMAL HIGH (ref 70–99)
Potassium: 3.4 mmol/L — ABNORMAL LOW (ref 3.5–5.1)
Sodium: 140 mmol/L (ref 135–145)

## 2020-03-06 LAB — TROPONIN I (HIGH SENSITIVITY)
Troponin I (High Sensitivity): <2 ng/L (ref <18)
Troponin I (High Sensitivity): <2 ng/L (ref <18)

## 2020-03-06 LAB — T4, FREE: Free T4: 0.91 ng/dL (ref 0.61–1.12)

## 2020-03-06 LAB — TSH: TSH: 2.947 u[IU]/mL (ref 0.350–4.500)

## 2020-03-06 MED ORDER — HYDRALAZINE HCL 20 MG/ML IJ SOLN
5.0000 mg | Freq: Once | INTRAMUSCULAR | Status: AC
  Administered 2020-03-06: 5 mg via INTRAVENOUS
  Filled 2020-03-06: qty 1

## 2020-03-06 NOTE — ED Triage Notes (Signed)
Pt BIB [facility] from home. Pt with intermittent chest pain since Friday. Pt feels burning in chest. Hypertensive in triage.

## 2020-03-06 NOTE — Discharge Instructions (Signed)
Your heart function and thyroid function is normal.   Please keep a log of your blood pressures   See your cardiologist in a week  Return to ER if you have worse chest pain or blood pressures over 200.

## 2020-03-06 NOTE — ED Provider Notes (Signed)
[facility] EMERGENCY DEPARTMENT Provider Note   CSN: [id number] Arrival date & time: 03/06/20  1356     History Chief Complaint  Patient presents with  . Chest Pain  . Hypertension    Victoria Cardenas is a 69 y.o. female history of hypertension, Graves' disease here presenting with hypertension and palpitations and chest pain.  Patient states that she has intermittent chest pain and palpitations for the last 3 to 4 days.  She states that she has some burning sensation in her chest.  She went to see her doctor earlier today and has a nonspecific EKG changes.  Patient was sent in for further evaluation.  Does not have any history of cardiac stents.  She states that her TSH was normal several months ago.  She is compliant with her methimazole.  She states that she is not currently on any meds for hypertension due to her previous intolerance to multiple meds.  The history is provided by the patient.       Past Medical History:  Diagnosis Date  . Breast cancer ([place]) 2005   right breast  . Glaucoma   . Hypercholesteremia   . Hypertension   . Medication intolerance    multiple  . Mild mitral regurgitation   . NSVT (nonsustained ventricular tachycardia) ([place])   . Shortened PR interval   . SVT (supraventricular tachycardia) ([facility])   . Thyroid disease     Patient Active Problem List   Diagnosis Date Noted  . Graves disease 04/04/2019  . Multinodular goiter 01/20/2019  . Subclinical hyperthyroidism 01/20/2019  . Hyperthyroidism 09/27/2018  . Sore throat, chronic 09/27/2018  . Heart palpitations 12/23/2016  . Essential hypertension 12/23/2016  . Hyperlipidemia 12/23/2016  . Hyperglycemia 12/23/2016  . Eustachian tube dysfunction, right 02/27/2016  . Ear pressure, right 02/06/2016  . 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 ABDOMINAL HYSTERECTOMY    . BREAST EXCISIONAL BIOPSY Left pt  unsure   . 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     Family History  Problem Relation Age of Onset  . Hypertension Mother   . Breast cancer Mother 35  . Breast cancer Maternal Aunt   . Breast cancer Maternal Grandmother     Social History   Tobacco Use  . Smoking status: Never Smoker  . Smokeless tobacco: Never Used  Vaping Use  . Vaping Use: Never used  Substance Use Topics  . Alcohol use: No  . Drug use: No    Home Medications Prior to Admission medications   Medication Sig Start Date End Date Taking? Authorizing Provider  aspirin EC 81 MG tablet Take 81 mg by mouth every other day.    [provider]  cholecalciferol (VITAMIN D3) 25 MCG (1000 UT) tablet Take 1,000 Units by mouth daily.    [provider]  LUMIGAN 0.01 % SOLN Place 1 drop into both eyes at bedtime. 05/14/18   [provider]  methimazole (TAPAZOLE) 5 MG tablet Take 0.5 tablets (2.5 mg total) by mouth daily. 12/05/19   Shamleffer, Melanie Crazier, MD  potassium chloride 20 MEQ/15ML (10%) SOLN Take 15 mLs (20 mEq total) by mouth daily. 11/07/19   Dorothy Spark, MD    Allergies    Amlodipine, Penicillins, Cardizem cd [diltiazem], Lisinopril, Losartan, Propranolol, Rosuvastatin, Ciprofloxacin, and Simvastatin  Review of Systems  Review of Systems  Cardiovascular: Positive for chest pain and palpitations.  All other systems reviewed and are negative.   Physical Exam Updated Vital Signs BP (!) 164/83   Pulse (!) 55   Temp 98.6 F (37 C) (Oral)   Resp 16   Ht 5\' 3"  (1.6 m)   Wt 51.7 kg   SpO2 100%   BMI 20.19 kg/m   Physical Exam Vitals and nursing note reviewed.  HENT:     Head: Normocephalic and atraumatic.  Eyes:     Extraocular Movements: Extraocular movements intact.     Pupils: Pupils are equal, round, and reactive to light.  Cardiovascular:     Rate and Rhythm: Regular rhythm. Bradycardia present.      Heart sounds: Normal heart sounds.  Pulmonary:     Effort: Pulmonary effort is normal.     Breath sounds: Normal breath sounds.  Abdominal:     Palpations: Abdomen is soft.  Musculoskeletal:        General: Normal range of motion.  Skin:    General: Skin is warm.     Capillary Refill: Capillary refill takes less than 2 seconds.  Neurological:     General: No focal deficit present.     Mental Status: She is alert and oriented to person, place, and time.  Psychiatric:        Mood and Affect: Mood normal.        Behavior: Behavior normal.     ED Results / Procedures / Treatments   Labs (all labs ordered are listed, but only abnormal results are displayed) Labs Reviewed  BASIC METABOLIC PANEL - Abnormal; Notable for the following components:      Result Value   Potassium 3.4 (*)    Glucose, Bld 102 (*)    All other components within normal limits  CBC  TSH  T4, FREE  T3  TROPONIN I (HIGH SENSITIVITY)  TROPONIN I (HIGH SENSITIVITY)    EKG EKG Interpretation  Date/Time:  Tuesday March 06 2020 13:49:12 EST Ventricular Rate:  58 PR Interval:  110 QRS Duration: 82 QT Interval:  438 QTC Calculation: 429 R Axis:   49 Text Interpretation: Sinus bradycardia with short PR with Premature atrial complexes Confirmed by Lajean Saver [phone]) on 03/06/2020 3:39:14 PM   Radiology DG Chest 2 View  Result Date: 03/06/2020 CLINICAL DATA:  Chest pain. EXAM: CHEST - 2 VIEW COMPARISON:  Sep 20, 2018. FINDINGS: The heart size and mediastinal contours are within normal limits. Both lungs are clear. No pneumothorax or pleural effusion is noted. The visualized skeletal structures are unremarkable. IMPRESSION: No active cardiopulmonary disease. Electronically Signed   By: Marijo Conception M.D.   On: 03/06/2020 14:25    Procedures Procedures (including critical care time)  Medications Ordered in ED Medications  hydrALAZINE (APRESOLINE) injection 5 mg (has no administration in time  range)    ED Course  I have reviewed the triage vital signs and the nursing notes.  Pertinent labs & imaging results that were available during my care of the patient were reviewed by me and considered in my medical decision making (see chart for details).    MDM Rules/Calculators/A&P                         Victoria Cardenas is a 69 y.o. female here with chest pain palpitations.  Consider ACS versus uncontrolled hyperthyroidism.  Patient is on methimazole.  Patient is also hypertensive.  She was taken off of BP meds because she had multiple intolerances.  Blood pressure is in the 180s in the ED.  We will try give some hydralazine.  Will get troponin x2 and also get TSH and free T4.  7:25 PM BP down to 120/70 with 1 dose of hydralazine.  Patient's TSH and free T4 were unremarkable.  Troponin negative x2.  Patient actually had intolerances to BP meds.  Since her blood pressure is normal after 1 dose of blood pressure medicine, I recommend that she just keep a log of her blood pressures.  Recommend that she gets close follow-up with cardiology   Final Clinical Impression(s) / ED Diagnoses Final diagnoses:  None    Rx / DC Orders ED Discharge Orders    None       Drenda Freeze, MD 03/06/20 1926

## 2020-03-07 ENCOUNTER — Telehealth: Payer: Self-pay | Admitting: Cardiology

## 2020-03-07 LAB — T3: T3, Total: 99 ng/dL (ref 71–180)

## 2020-03-07 NOTE — Telephone Encounter (Signed)
Pt called in to let Dr. [name] know that she went in to see her PCP yesterday and her PCP sent her to the ER for chest palpitations and HTN.  Pt states she went to [facility] ER. Pt states they gave her hydralazine IV and her BP came down to 120/70. Pt had full cardiac work-up and EKG.  Pt also included some recent HR and BP readings in this message. Pt states that the ER MD who discharged her, advised her to call our office today and schedule a 1-2 week follow-up with Dr. [name] or an APP.  Pt states she is feeling much better today.  Scheduled the pt to come in and see Truitt Merle NP on 11/30 at 3:15 pm.  She is aware to arrive 15 mins prior to this visit.  Advised the pt to continue her regimen.  Informed her that I will make Dr. [name] aware of her ER visit and aware that she will come in and see Cecille Rubin on 11/30, per discharge.  Pt verbalized understanding and agrees with this plan.

## 2020-03-07 NOTE — Telephone Encounter (Signed)
STAT if patient feels like he/she is going to faint   1) Are you dizzy now? No  2) Do you feel faint or have you passed out? No  3) Do you have any other symptoms? No   4) Have you checked your HR and BP (record if available)?  BP: 142/90 HR: 62 BP: 148/80 HR: 61 BP: 114/70 HR: 64 BP: 124/75 HR: 61 BP: 148/78 HR: 59 BP: 113/67 HR: 65

## 2020-03-13 NOTE — Progress Notes (Signed)
CARDIOLOGY OFFICE NOTE  Date:  03/27/2020    Victoria Cardenas Date of Birth: 1950-07-03 Medical Record #[id number]  PCP:  Cipriano Mile, NP  Cardiologist:  [name]  Chief Complaint  Patient presents with  . Hospitalization Follow-up    Seen for Dr. [name]    History of Present Illness: Victoria Cardenas is a 69 y.o. female who presents today for a post hospital visit. Seen for Dr. [name].   She has a history of HTN, palpitations/SVT and Graves' disease. She is intolerant to most medicines for HTN.   Last seen a year ago by Dr. [name] and felt to be doing ok.   Presented to the hospital earlier this month with hypertension, palpitations and chest pain. She went to see her PCP and had nonspecific EKG changes.  Referred on to the ER for further evaluation.  Noted that she is not currently on any meds for hypertension due to her previous intolerance to multiple meds.   BP in the 180s in the ER. Given Hydralazine x 1 dose with improvement. Asked to follow up back here. Noted multiple med intolerances.   Comes in today. Here alone. She is feeling better. Less palpitations. Her blood pressure diary over the past few weeks is reviewed and this looks good overall. She has a few low readings - eats and this makes her feel better. Very few high readings. Overall they are fine. She feels better. She is on no medicines. TSH was normal.   Past Medical History:  Diagnosis Date  . Breast cancer ([place]) 2005   right breast  . Glaucoma   . Hypercholesteremia   . Hypertension   . Medication intolerance    multiple  . Mild mitral regurgitation   . NSVT (nonsustained ventricular tachycardia) ([place])   . Shortened PR interval   . SVT (supraventricular tachycardia) ([facility])   . Thyroid disease     Past Surgical History:  Procedure Laterality Date  . ABDOMINAL HYSTERECTOMY    . BREAST EXCISIONAL BIOPSY Left pt unsure   . 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 aspirin EC 81 MG tablet Take 81 mg by mouth every other day.  . cholecalciferol (VITAMIN D3) 25 MCG (1000 UT) tablet Take 1,000 Units by mouth daily.  Marland Kitchen LUMIGAN 0.01 % SOLN Place 1 drop into both eyes at bedtime.  . methimazole (TAPAZOLE) 5 MG tablet Take 0.5 tablets (2.5 mg total) by mouth daily.  . potassium chloride 20 MEQ/15ML (10%) SOLN Take 15 mLs (20 mEq total) by mouth daily.     Allergies: Allergies  Allergen Reactions  . Amlodipine Swelling and Palpitations    Swelling, heart pounding   . Penicillins Hives    Has patient had a PCN reaction causing immediate rash, facial/tongue/throat swelling, SOB or lightheadedness with hypotension: yes Has patient had a PCN reaction causing severe rash involving mucus membranes or skin necrosis: no Has patient had a PCN reaction that required hospitalization: no Has patient had a PCN reaction occurring within the last 10 years: no If all of the above answers are "NO", then may proceed with Cephalosporin   . Cardizem Cd [Diltiazem] Other (See Comments)    Pt reports this med causes her fatigue, dizziness, lightheaded, sore throat, and hard swallowing meals.  . Lisinopril     Patient refused prior rx due to potential side effects outlined in medication information  . Losartan Other (See  Comments)    Pt reports causes frequent urination and UTIs  . Propranolol Other (See Comments)    Pt reports causes flushing   . Rosuvastatin Other (See Comments)    Pt reports caused her to feel nauseated, dizzy, and lightheaded.   . Ciprofloxacin Palpitations  . Simvastatin Other (See Comments)    Increased urination    Social History: The patient  reports that she has never smoked. She has never used smokeless tobacco. She reports that she does not drink alcohol and does not use drugs.   Family History: The patient's family history includes Breast cancer in her maternal aunt and maternal grandmother; Breast cancer (age of  onset: 25) in her mother; Hypertension in her mother.   Review of Systems: Please see the history of present illness.   All other systems are reviewed and negative.   Physical Exam: VS:  BP (!) 142/80   Pulse 63   Ht 5\' 3"  (1.6 m)   Wt 120 lb (54.4 kg)   SpO2 97%   BMI 21.26 kg/m  .  BMI Body mass index is 21.26 kg/m.  Wt Readings from Last 3 Encounters:  03/27/20 120 lb (54.4 kg)  03/06/20 114 lb (51.7 kg)  11/04/19 122 lb 3.2 oz (55.4 kg)    General: Alert and in no acute distress.  She is thin.  Cardiac: Regular rate and rhythm. No murmurs, rubs, or gallops. No edema.  Respiratory:  Lungs are clear to auscultation bilaterally with normal work of breathing.  GI: Soft and nontender.  MS: No deformity or atrophy. Gait and ROM intact.  Skin: Warm and dry. Color is normal.  Neuro:  Strength and sensation are intact and no gross focal deficits noted.  Psych: Alert, appropriate and with normal affect.   LABORATORY DATA:  EKG:  EKG is not ordered today.    Lab Results  Component Value Date   WBC 5.3 03/06/2020   HGB 14.5 03/06/2020   HCT 44.7 03/06/2020   PLT 230 03/06/2020   GLUCOSE 102 (H) 03/06/2020   CHOL 200 (H) 04/14/2019   TRIG 133 04/14/2019   HDL 36 (L) 04/14/2019   LDLCALC 140 (H) 04/14/2019   ALT 12 04/14/2019   AST 20 04/14/2019   NA 140 03/06/2020   K 3.4 (L) 03/06/2020   CL 102 03/06/2020   CREATININE 0.76 03/06/2020   BUN 8 03/06/2020   CO2 29 03/06/2020   TSH 2.947 03/06/2020   INR 1.11 09/07/2014     BNP (last 3 results) No results for input(s): BNP in the last 8760 hours.  ProBNP (last 3 results) No results for input(s): PROBNP in the last 8760 hours.   Other Studies Reviewed Today:  ECHO IMPRESSIONS 09/2018   1. The left ventricle has normal systolic function with an ejection  fraction of 60-65%. The cavity size was normal. Left ventricular diastolic  parameters were normal. No evidence of left ventricular regional wall  motion  abnormalities.  2. The right ventricle has normal systolic function. The cavity was  normal. There is no increase in right ventricular wall thickness. Right  ventricular systolic pressure could not be assessed.  3. Mild thickening of the mitral valve leaflet. The MR jet is  centrally-directed.  4. Mild thickening of the aortic valve. No stenosis of the aortic valve.     ASSESSMENT & PLAN:    1. HTN - overall her blood pressure diary is fine. She is intolerant to most medicines. No therapy indicated at  this time.   2. Graves disease - recent TSH was fine.   3. Multiple medicine intolerances  4. History of palpitations with prior SVT - this has improved - if worsens - would consider repeat monitor and referral to EP - she has multiple medicine intolerance. EF has been normal.   Current medicines are reviewed with the patient today.  The patient does not have concerns regarding medicines other than what has been noted above.  The following changes have been made:  See above.  Labs/ tests ordered today include:    No orders of the defined types were placed in this encounter.    Disposition:   FU with Dr. [name] as planned.    Patient is agreeable to this plan and will call if any problems develop in the interim.   SignedTruitt Merle, NP  03/27/2020 3:39 PM  [facility] [address] [place], [place]  [address] Phone: [phone] Fax: [phone]

## 2020-03-27 ENCOUNTER — Ambulatory Visit (INDEPENDENT_AMBULATORY_CARE_PROVIDER_SITE_OTHER): Payer: Medicare Other | Admitting: Nurse Practitioner

## 2020-03-27 ENCOUNTER — Encounter: Payer: Self-pay | Admitting: Nurse Practitioner

## 2020-03-27 ENCOUNTER — Other Ambulatory Visit: Payer: Self-pay

## 2020-03-27 VITALS — BP 142/80 | HR 63 | Ht 63.0 in | Wt 120.0 lb

## 2020-03-27 DIAGNOSIS — I471 Supraventricular tachycardia: Secondary | ICD-10-CM | POA: Diagnosis not present

## 2020-03-27 DIAGNOSIS — R0602 Shortness of breath: Secondary | ICD-10-CM | POA: Diagnosis not present

## 2020-03-27 DIAGNOSIS — I472 Ventricular tachycardia: Secondary | ICD-10-CM | POA: Diagnosis not present

## 2020-03-27 DIAGNOSIS — I340 Nonrheumatic mitral (valve) insufficiency: Secondary | ICD-10-CM

## 2020-03-27 DIAGNOSIS — I4729 Other ventricular tachycardia: Secondary | ICD-10-CM

## 2020-03-27 NOTE — Patient Instructions (Addendum)
After Visit Summary:  We will be checking the following labs today - NONE   Medication Instructions:    Continue with your current medicines.    If you need a refill on your cardiac medications before your next appointment, please call your pharmacy.     Testing/Procedures To Be Arranged:  N/A  Follow-Up:   See Dr. [name] as planned.     At [facility], you and your health needs are our priority.  As part of our continuing mission to provide you with exceptional heart care, we have created designated Provider Care Teams.  These Care Teams include your primary Cardiologist (physician) and Advanced Practice Providers (APPs -  Physician Assistants and Nurse Practitioners) who all work together to provide you with the care you need, when you need it.  Special Instructions:  . Stay safe, wash your hands for at least 20 seconds and wear a mask when needed.  . It was good to talk with you today.    Call the [place] office at [phone] if you have any questions, problems or concerns.

## 2020-04-10 ENCOUNTER — Other Ambulatory Visit: Payer: Self-pay

## 2020-04-10 ENCOUNTER — Ambulatory Visit (INDEPENDENT_AMBULATORY_CARE_PROVIDER_SITE_OTHER): Payer: Medicare Other | Admitting: Cardiology

## 2020-04-10 ENCOUNTER — Encounter: Payer: Self-pay | Admitting: Cardiology

## 2020-04-10 VITALS — BP 154/84 | HR 56 | Ht 63.0 in | Wt 119.8 lb

## 2020-04-10 DIAGNOSIS — Z79899 Other long term (current) drug therapy: Secondary | ICD-10-CM | POA: Diagnosis not present

## 2020-04-10 DIAGNOSIS — E785 Hyperlipidemia, unspecified: Secondary | ICD-10-CM

## 2020-04-10 DIAGNOSIS — I10 Essential (primary) hypertension: Secondary | ICD-10-CM | POA: Diagnosis not present

## 2020-04-10 MED ORDER — RED YEAST RICE 600 MG PO TABS: 600.0000 mg | ORAL_TABLET | Freq: Every day | ORAL | 2 refills | Status: DC

## 2020-04-10 NOTE — Progress Notes (Signed)
CARDIOLOGY OFFICE NOTE  Date:  04/10/2020    Victoria Cardenas Date of Birth: 1950/08/07 Medical Record #[id number]  PCP:  Cipriano Mile, NP  Cardiologist:  [name]  Chief complaint: Palpitations  History of Present Illness: Victoria Cardenas is a 69 y.o. female with history of HTN, palpitations/SVT and Graves' disease. She is intolerant to most medicines for HTN. Presented to the hospital on November 9 with hypertension, palpitations and chest pain. She went to see her PCP and had nonspecific EKG changes.  Referred on to the ER for further evaluation.  Noted that she is not currently on any meds for hypertension due to her previous intolerance to multiple meds. BP in the 180s in the ER. Given Hydralazine x 1 dose with improvement. Asked to follow up back here. Noted multiple med intolerances.   She is coming for follow-up, her palpitations have almost resolved, she gets only rare about minute lasting palpitations that are not associated with any dizziness chest pain or shortness of breath.  Her TSH free T3 and free T4 were checked in the ER.  She brings blood pressure diary that shows excellent blood pressures at home.  Past Medical History:  Diagnosis Date  . Breast cancer ([place]) 2005   right breast  . Glaucoma   . Hypercholesteremia   . Hypertension   . Medication intolerance    multiple  . Mild mitral regurgitation   . NSVT (nonsustained ventricular tachycardia) ([place])   . Shortened PR interval   . SVT (supraventricular tachycardia) ([facility])   . Thyroid disease    Past Surgical History:  Procedure Laterality Date  . ABDOMINAL HYSTERECTOMY    . BREAST EXCISIONAL BIOPSY Left pt unsure   . 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 aspirin EC 81 MG tablet Take 81 mg by mouth every other day.  . brimonidine (ALPHAGAN P) 0.1 % SOLN Place into both eyes 2 (two) times daily.  . cholecalciferol (VITAMIN D3) 25 MCG (1000 UT) tablet Take  1,000 Units by mouth daily.  Marland Kitchen LUMIGAN 0.01 % SOLN Place 1 drop into both eyes at bedtime.  . methimazole (TAPAZOLE) 5 MG tablet Take 0.5 tablets (2.5 mg total) by mouth daily.  . potassium chloride 20 MEQ/15ML (10%) SOLN Take 15 mLs (20 mEq total) by mouth daily.   Allergies: Allergies  Allergen Reactions  . Amlodipine Swelling and Palpitations    Swelling, heart pounding   . Penicillins Hives    Has patient had a PCN reaction causing immediate rash, facial/tongue/throat swelling, SOB or lightheadedness with hypotension: yes Has patient had a PCN reaction causing severe rash involving mucus membranes or skin necrosis: no Has patient had a PCN reaction that required hospitalization: no Has patient had a PCN reaction occurring within the last 10 years: no If all of the above answers are "NO", then may proceed with Cephalosporin   . Cardizem Cd [Diltiazem] Other (See Comments)    Pt reports this med causes her fatigue, dizziness, lightheaded, sore throat, and hard swallowing meals.  . Lisinopril     Patient refused prior rx due to potential side effects outlined in medication information  . Losartan Other (See Comments)    Pt reports causes frequent urination and UTIs  . Propranolol Other (See Comments)    Pt reports causes flushing   . Rosuvastatin Other (See Comments)    Pt reports caused her to feel nauseated, dizzy, and lightheaded.   Marland Kitchen  Ciprofloxacin Palpitations  . Simvastatin Other (See Comments)    Increased urination   Social History: The patient  reports that she has never smoked. She has never used smokeless tobacco. She reports that she does not drink alcohol and does not use drugs.   Family History: The patient's family history includes Breast cancer in her maternal aunt and maternal grandmother; Breast cancer (age of onset: 61) in her mother; Hypertension in her mother.   Review of Systems: Please see the history of present illness.   All other systems are reviewed  and negative.   Physical Exam: VS:  BP (!) 154/84   Pulse (!) 56   Ht 5\' 3"  (1.6 m)   Wt 119 lb 12.8 oz (54.3 kg)   SpO2 98%   BMI 21.22 kg/m  .  BMI Body mass index is 21.22 kg/m.  Wt Readings from Last 3 Encounters:  04/10/20 119 lb 12.8 oz (54.3 kg)  03/27/20 120 lb (54.4 kg)  03/06/20 114 lb (51.7 kg)   General: Alert and in no acute distress.  She is thin.  Cardiac: Regular rate and rhythm. No murmurs, rubs, or gallops. No edema.  Respiratory:  Lungs are clear to auscultation bilaterally with normal work of breathing.  GI: Soft and nontender.  MS: No deformity or atrophy. Gait and ROM intact.  Skin: Warm and dry. Color is normal.  Neuro:  Strength and sensation are intact and no gross focal deficits noted.  Psych: Alert, appropriate and with normal affect.  LABORATORY DATA:  EKG:  EKG is ordered today and it shows sinus bradycardia, 53 bpm, otherwise normal EKG and unchanged from prior.   Lab Results  Component Value Date   WBC 5.3 03/06/2020   HGB 14.5 03/06/2020   HCT 44.7 03/06/2020   PLT 230 03/06/2020   GLUCOSE 102 (H) 03/06/2020   CHOL 200 (H) 04/14/2019   TRIG 133 04/14/2019   HDL 36 (L) 04/14/2019   LDLCALC 140 (H) 04/14/2019   ALT 12 04/14/2019   AST 20 04/14/2019   NA 140 03/06/2020   K 3.4 (L) 03/06/2020   CL 102 03/06/2020   CREATININE 0.76 03/06/2020   BUN 8 03/06/2020   CO2 29 03/06/2020   TSH 2.947 03/06/2020   INR 1.11 09/07/2014   BNP (last 3 results) No results for input(s): BNP in the last 8760 hours.  ProBNP (last 3 results) No results for input(s): PROBNP in the last 8760 hours.  Other Studies Reviewed Today:  ECHO IMPRESSIONS 09/2018   1. The left ventricle has normal systolic function with an ejection  fraction of 60-65%. The cavity size was normal. Left ventricular diastolic  parameters were normal. No evidence of left ventricular regional wall  motion abnormalities.  2. The right ventricle has normal systolic function.  The cavity was  normal. There is no increase in right ventricular wall thickness. Right  ventricular systolic pressure could not be assessed.  3. Mild thickening of the mitral valve leaflet. The MR jet is  centrally-directed.  4. Mild thickening of the aortic valve. No stenosis of the aortic valve.    ASSESSMENT & PLAN:    1. HTN -excellent controll in last 3 months diary.  2. Graves disease - recent TSH was fine.  Free T3 and free T4.  3. History of palpitations with prior SVT - this has improved -no therapy or further work-up, her LVEF is normal, will obtain Zio patch monitor if her symptoms get worse.  4.  Hyperlipidemia  with LDL of 140, she is intolerant to statins, I will start red yeast rice 700 mg daily.  Current medicines are reviewed with the patient today.  The patient does not have concerns regarding medicines other than what has been noted above.  The following changes have been made:  See above.  Labs/ tests ordered today include:    Orders Placed This Encounter  Procedures  . EKG 12-Lead     Disposition:   FU with Dr. [name] in 6 months   Patient is agreeable to this plan and will call if any problems develop in the interim.   Signed: Ena Dawley, MD  04/10/2020 10:27 AM  [facility] [address] [place], [place]  [address] Phone: [phone] Fax: [phone]

## 2020-04-10 NOTE — Patient Instructions (Signed)
Medication Instructions:   START TAKING RED YEAST RICE 600 MG BY MOUTH DAILY  *If you need a refill on your cardiac medications before your next appointment, please call your pharmacy*   Follow-Up: At [facility], you and your health needs are our priority.  As part of our continuing mission to provide you with exceptional heart care, we have created designated Provider Care Teams.  These Care Teams include your primary Cardiologist (physician) and Advanced Practice Providers (APPs -  Physician Assistants and Nurse Practitioners) who all work together to provide you with the care you need, when you need it.  We recommend signing up for the patient portal called "MyChart".  Sign up information is provided on this After Visit Summary.  MyChart is used to connect with patients for Virtual Visits (Telemedicine).  Patients are able to view lab/test results, encounter notes, upcoming appointments, etc.  Non-urgent messages can be sent to your provider as well.   To learn more about what you can do with MyChart, go to [email or web address].    Your next appointment:   6 month(s)  The format for your next appointment:   In Person  Provider:   Ena Dawley, MD

## 2020-04-26 ENCOUNTER — Other Ambulatory Visit: Payer: Self-pay | Admitting: Student

## 2020-04-26 DIAGNOSIS — Z1231 Encounter for screening mammogram for malignant neoplasm of breast: Secondary | ICD-10-CM

## 2020-05-11 ENCOUNTER — Encounter: Payer: Self-pay | Admitting: Internal Medicine

## 2020-05-11 ENCOUNTER — Telehealth: Payer: Self-pay | Admitting: Internal Medicine

## 2020-05-11 ENCOUNTER — Ambulatory Visit (INDEPENDENT_AMBULATORY_CARE_PROVIDER_SITE_OTHER): Payer: Medicare Other | Admitting: Internal Medicine

## 2020-05-11 ENCOUNTER — Other Ambulatory Visit: Payer: Self-pay

## 2020-05-11 VITALS — BP 128/88 | HR 67 | Resp 16 | Ht 63.0 in | Wt 121.0 lb

## 2020-05-11 DIAGNOSIS — M810 Age-related osteoporosis without current pathological fracture: Secondary | ICD-10-CM | POA: Diagnosis not present

## 2020-05-11 DIAGNOSIS — E042 Nontoxic multinodular goiter: Secondary | ICD-10-CM

## 2020-05-11 DIAGNOSIS — E0500 Thyrotoxicosis with diffuse goiter without thyrotoxic crisis or storm: Secondary | ICD-10-CM

## 2020-05-11 LAB — T4, FREE: Free T4: 0.86 ng/dL (ref 0.60–1.60)

## 2020-05-11 LAB — TSH: TSH: 2.27 u[IU]/mL (ref 0.35–4.50)

## 2020-05-11 MED ORDER — METHIMAZOLE 5 MG PO TABS
2.5000 mg | ORAL_TABLET | Freq: Every day | ORAL | 3 refills | Status: DC
Start: 2020-05-11 — End: 2021-01-31

## 2020-05-11 NOTE — Patient Instructions (Addendum)
-   Continue Methimazole 5 mg, at  HALF a tablet daily

## 2020-05-11 NOTE — Telephone Encounter (Signed)
Called pt and scheduled appt for 05/25/20 at 11:30am.

## 2020-05-11 NOTE — Telephone Encounter (Signed)
Please schedule the pt for a [facility]    Please let the pt know when this is scheduled    Thanks   Abby Nena Jordan, MD  [facility] Endocrin[facility]  [facility] [address] [place], [place] [address] Phone: [phone] FAX: [phone]

## 2020-05-11 NOTE — Progress Notes (Signed)
Name: Victoria Cardenas  MRN/ DOB: [id number], [birth year]    Age/ Sex: 70 y.o., female     PCP: Cipriano Mile, NP   Reason for Endocrinology Evaluation: Subclinical hyperthyroidism     Initial Endocrinology Clinic Visit: 08/25/2018    PATIENT IDENTIFIER: [name] is a 70 y.o., female with a past medical history of HTN, Hx of breast cancer (S/P lumpectomy ) and Osteoporosis   . She has followed with [facility] since 08/25/2018 for consultative assistance with management of her Subclinical hyperthyroidism.   HISTORICAL SUMMARY:   Pt was noted with low TSH in 11/2016, at the time she presented to the ED for palpitations.   In April, 2020 she presented again with palpitations and sob and was found to have a TSH of 0.056 uIU/mL with normal T3 and T4. This was attributed to Graves' Disease due to elevated TRAb level of 3.34 IU/L.   - She is S/P benign FNA of the right mid nodule in 2018 , other sub-centimeter nodules present on ultrasound bilaterally but did not require further follow up.  -Repeat thyroid ultrasound in 04/2019 showed stable right mid nodule at 2.9 cm   No FH of thyroid disease.    Osteoporosis : She was diagnosed with osteoporosis 2017, and was started on boniva at the time, but pt stopped it due to palpitations and diarrhea ~ 2 yrs ago.  Currently she is only on Calcium and Vitamin D 2 tablets daily  She declines antiresorptive therapy     SUBJECTIVE:    Today (05/11/2020):  Ms. [name] is here for a  4 months follow up visit on subclinical hyperthyroidism secondary to Graves' Disease.    Denies nausea, diarrhea or fever   Denies any local neck symptoms except for occasional tightness, denies heartburn     Medications: Methimazole 2.5 mg daily     HISTORY:  Past Medical History:  Past Medical History:  Diagnosis Date   Breast cancer ([place]) 2005   right breast   Glaucoma    Hypercholesteremia    Hypertension     Medication intolerance    multiple   Mild mitral regurgitation    NSVT (nonsustained ventricular tachycardia) ([facility])    Shortened PR interval    SVT (supraventricular tachycardia) ([place])    Thyroid disease    Past Surgical History:  Past Surgical History:  Procedure Laterality Date   ABDOMINAL HYSTERECTOMY     BREAST EXCISIONAL BIOPSY Left pt unsure    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 She has never used smokeless tobacco. She reports that she does not drink alcohol and does not use drugs. Family History:  Family History  Problem Relation Age of Onset   Hypertension Mother    Breast cancer Mother 43   Breast cancer Maternal Aunt    Breast cancer Maternal Grandmother      HOME MEDICATIONS: Allergies as of 05/11/2020      Reactions   Amlodipine Swelling, Palpitations   Swelling, heart pounding   Penicillins Hives   Has patient had a PCN reaction causing immediate rash, facial/tongue/throat swelling, SOB or lightheadedness with hypotension: yes Has patient had a PCN reaction causing severe rash involving mucus membranes or skin necrosis: no Has patient had a PCN reaction that required hospitalization: no Has patient had a PCN reaction occurring within the last 10 years: no If all of the above answers are "NO", then may  proceed with Cephalosporin   Cardizem Cd [diltiazem] Other (See Comments)   Pt reports this med causes her fatigue, dizziness, lightheaded, sore throat, and hard swallowing meals.   Lisinopril    Patient refused prior rx due to potential side effects outlined in medication information   Losartan Other (See Comments)   Pt reports causes frequent urination and UTIs   Propranolol Other (See Comments)   Pt reports causes flushing    Rosuvastatin Other (See Comments)   Pt reports caused her to feel nauseated, dizzy, and lightheaded.    Ciprofloxacin Palpitations   Simvastatin Other (See Comments)    Increased urination      Medication List       Accurate as of May 11, 2020  1:30 PM. If you have any questions, ask your nurse or doctor.        aspirin EC 81 MG tablet Take 81 mg by mouth every other day.   brimonidine 0.1 % Soln Commonly known as: ALPHAGAN P Place into both eyes 2 (two) times daily.   cholecalciferol 25 MCG (1000 UNIT) tablet Commonly known as: VITAMIN D3 Take 1,000 Units by mouth daily.   Lumigan 0.01 % Soln Generic drug: bimatoprost Place 1 drop into both eyes at bedtime.   methimazole 5 MG tablet Commonly known as: TAPAZOLE Take 0.5 tablets (2.5 mg total) by mouth daily.   potassium chloride 20 MEQ/15ML (10%) Soln Take 15 mLs (20 mEq total) by mouth daily.   Red Yeast Rice 600 MG Tabs Take 1 tablet (600 mg total) by mouth daily.         OBJECTIVE:   PHYSICAL EXAM: VS: BP 128/88    Pulse 67    Resp 16    Ht 5\' 3"  (1.6 m)    Wt 121 lb (54.9 kg)    SpO2 98%    BMI 21.43 kg/m    EXAM: General: Pt appears well and is in NAD  Neck: General: Supple without adenopathy. Thyroid: Thyroid size normal.  No goiter or nodules appreciated.  Lungs: Clear with good BS bilat with no rales, rhonchi, or wheezes  Heart: Auscultation: RRR.  Abdomen: Normoactive bowel sounds, soft, nontender, without masses or organomegaly palpable  Extremities:  BL LE: No pretibial edema normal ROM and strength.  Mental Status: Judgment, insight: Intact Orientation: Oriented to time, place, and person Mood and affect: No depression, anxiety, or agitation     DATA REVIEWED:  Results for KATARZYNA, WENTE (MRN [id number]) as of 05/11/2020 13:30  Ref. Range 05/11/2020 10:36  TSH Latest Ref Range: 0.35 - 4.50 uIU/mL 2.27  T4,Free(Direct) Latest Ref Range: 0.60 - 1.60 ng/dL 0.86     Results for KASY, DOKE (MRN [id number]) as of 10/20/2018 09:48  Ref. Range 09/10/2018 11:13  TRAB Latest Ref Range: <=2.00 IU/L 3.34 (H)   Thyroid Ultrasound  05/03/2019   Multiple small thyroid nodules are noted bilaterally, all of which measure less than 1 cm and are essentially stable from prior study. The dominant 2.9 cm thyroid nodule in the right mid thyroid gland is stable from prior study and was previously biopsy. There are no new concerning thyroid nodules bilaterally. There are no pathologically enlarged lymph nodes. There is a new 0.8 cm thyroid nodule in the mid isthmus that does not meet criteria for follow-up or FNA.  IMPRESSION: 1. Borderline enlarged multinodular thyroid gland as detailed above. 2. Stable dominant 2.9 cm thyroid nodule in the right mid thyroid gland that was previously biopsied. 3.  Additional small subcentimeter essentially stable thyroid nodules, none of which meet criteria for follow-up.    FNA RMP nodule 2.9x1.4x1.8 (11/27/2015)   CONSISTENT WITH BENIGN FOLLICULAR NODULE (BETHESDA CATEGORY II).  ASSESSMENT / PLAN / RECOMMENDATIONS:   Subclinical Hyperthyroidism Secondary to Graves' Disease:  - Patient is clinically euthyroid - She is tolerating methimazole well.  - No local neck symptoms - Repeat TFT's today continue to be normal    Medications  Continue  Methimazole 2.5 mg daily     2. Multinodular Goiter   - No local neck symptoms  - She is S/P benign FNA of the right mid nodule in 2017 , other sub-centimeter nodules present on ultrasound bilaterally but did not require further follow up.  -Repeat thyroid ultrasound in 04/2019 showed stable right mid nodule at 2.9 cm - Will order a follow up by next visit    3. Graves' Disease:  - No evidence of extra-thyroidal manifestations of graves' disease, pt advised to notify her ophthalmologist of this diagnosis.   4. Osteoporosis:   - She has declined treatment but she is on calcium and Vitamin D  - Will repeat bone density at the breast center and discuss treatment options again   F/u in 6 month     Signed electronically by: Mack Guise, MD  [facility] Endocrinology  [facility] [address] [place], [place] [address] Phone: [phone] FAX: [phone]      CC: [address], [place] [address] Phone: [phone]  Fax: [phone]   Return to Endocrinology clinic as below: Future Appointments  Date Time Provider [place]  06/07/2020 10:00 AM [facility] 3 [facility]  11/14/2020 10:30 AM Kamren Heintzelman, Melanie Crazier, MD [facility] None

## 2020-05-25 ENCOUNTER — Telehealth: Payer: Self-pay | Admitting: Internal Medicine

## 2020-05-25 ENCOUNTER — Inpatient Hospital Stay: Admission: RE | Admit: 2020-05-25 | Payer: Medicare Other | Source: Ambulatory Visit

## 2020-05-25 NOTE — Telephone Encounter (Signed)
Patient called to advise that neither she nor her transportation knew where she was supposed to be today for her scan.  They went to [facility] location who said she was supposed to be at [place].  Transportation took her there and they said she did not have an appointment there either.  Patient wanted doctor to be made aware and she will need to re-scheduled  Call back [phone]

## 2020-05-25 NOTE — Telephone Encounter (Signed)
FYI

## 2020-05-28 NOTE — Telephone Encounter (Signed)
She was supposed to have a bone density at the [facility], this is where she had her bone density in the past, please check with them and reschedule her with the time, /date and address    Thanks

## 2020-05-28 NOTE — Telephone Encounter (Signed)
Patient stated that she has her mammogram schedule for 06/07/2020 at the [facility]. She will discuss in person at the [facility] to schedule bone density, she believe it is better if she does in person that way she will get it right

## 2020-06-07 ENCOUNTER — Other Ambulatory Visit: Payer: Self-pay

## 2020-06-07 ENCOUNTER — Ambulatory Visit
Admission: RE | Admit: 2020-06-07 | Discharge: 2020-06-07 | Disposition: A | Discharge status: Home / Self Care | Payer: Medicare Other | Source: Ambulatory Visit | Attending: Student | Admitting: Student

## 2020-06-07 DIAGNOSIS — Z1231 Encounter for screening mammogram for malignant neoplasm of breast: Secondary | ICD-10-CM

## 2020-06-12 ENCOUNTER — Other Ambulatory Visit: Payer: Self-pay | Admitting: Cardiology

## 2020-08-04 ENCOUNTER — Emergency Department (HOSPITAL_COMMUNITY)
Admission: EM | Admit: 2020-08-04 | Discharge: 2020-08-04 | Disposition: A | Discharge status: Home / Self Care | Payer: Medicare Other | Attending: Emergency Medicine | Admitting: Emergency Medicine

## 2020-08-04 ENCOUNTER — Emergency Department (HOSPITAL_COMMUNITY): Payer: Medicare Other

## 2020-08-04 ENCOUNTER — Encounter (HOSPITAL_COMMUNITY): Payer: Self-pay

## 2020-08-04 DIAGNOSIS — Z7982 Long term (current) use of aspirin: Secondary | ICD-10-CM | POA: Insufficient documentation

## 2020-08-04 DIAGNOSIS — R202 Paresthesia of skin: Secondary | ICD-10-CM | POA: Insufficient documentation

## 2020-08-04 DIAGNOSIS — R200 Anesthesia of skin: Secondary | ICD-10-CM | POA: Diagnosis present

## 2020-08-04 DIAGNOSIS — Z79899 Other long term (current) drug therapy: Secondary | ICD-10-CM | POA: Insufficient documentation

## 2020-08-04 DIAGNOSIS — Z853 Personal history of malignant neoplasm of breast: Secondary | ICD-10-CM | POA: Insufficient documentation

## 2020-08-04 DIAGNOSIS — I10 Essential (primary) hypertension: Secondary | ICD-10-CM | POA: Diagnosis not present

## 2020-08-04 LAB — CBC WITH DIFFERENTIAL/PLATELET
Abs Immature Granulocytes: 0.01 10*3/uL (ref 0.00–0.07)
Basophils Absolute: 0 10*3/uL (ref 0.0–0.1)
Basophils Relative: 0 %
Eosinophils Absolute: 0.1 10*3/uL (ref 0.0–0.5)
Eosinophils Relative: 1 %
HCT: 43.3 % (ref 36.0–46.0)
Hemoglobin: 14.7 g/dL (ref 12.0–15.0)
Immature Granulocytes: 0 %
Lymphocytes Relative: 23 %
Lymphs Abs: 1.5 10*3/uL (ref 0.7–4.0)
MCH: 32.1 pg (ref 26.0–34.0)
MCHC: 33.9 g/dL (ref 30.0–36.0)
MCV: 94.5 fL (ref 80.0–100.0)
Monocytes Absolute: 0.5 10*3/uL (ref 0.1–1.0)
Monocytes Relative: 8 %
Neutro Abs: 4.3 10*3/uL (ref 1.7–7.7)
Neutrophils Relative %: 68 %
Platelets: 214 10*3/uL (ref 150–400)
RBC: 4.58 MIL/uL (ref 3.87–5.11)
RDW: 11.5 % (ref 11.5–15.5)
WBC: 6.4 10*3/uL (ref 4.0–10.5)
nRBC: 0 % (ref 0.0–0.2)

## 2020-08-04 LAB — MAGNESIUM: Magnesium: 1.9 mg/dL (ref 1.7–2.4)

## 2020-08-04 LAB — BASIC METABOLIC PANEL
Anion gap: 3 — ABNORMAL LOW (ref 5–15)
BUN: 9 mg/dL (ref 8–23)
CO2: 29 mmol/L (ref 22–32)
Calcium: 8.8 mg/dL — ABNORMAL LOW (ref 8.9–10.3)
Chloride: 108 mmol/L (ref 98–111)
Creatinine, Ser: 0.69 mg/dL (ref 0.44–1.00)
GFR, Estimated: >60 mL/min (ref >60)
Glucose, Bld: 108 mg/dL — ABNORMAL HIGH (ref 70–99)
Potassium: 3.6 mmol/L (ref 3.5–5.1)
Sodium: 140 mmol/L (ref 135–145)

## 2020-08-04 LAB — URINALYSIS, ROUTINE W REFLEX MICROSCOPIC
Bilirubin Urine: NEGATIVE
Glucose, UA: NEGATIVE mg/dL
Hgb urine dipstick: NEGATIVE
Ketones, ur: NEGATIVE mg/dL
Leukocytes,Ua: NEGATIVE
Nitrite: NEGATIVE
Protein, ur: NEGATIVE mg/dL
Specific Gravity, Urine: 1.002 — ABNORMAL LOW (ref 1.005–1.030)
pH: 8 (ref 5.0–8.0)

## 2020-08-04 NOTE — Discharge Instructions (Addendum)
The testing today does not show any serious problems.  Please continue working on getting into see a neurologist for further evaluation of the numbness you are having in both arms.  Your PCP can help get the appointment.

## 2020-08-04 NOTE — ED Notes (Signed)
Pt went to CT

## 2020-08-04 NOTE — ED Provider Notes (Signed)
[facility] EMERGENCY DEPARTMENT Provider Note   CSN: [id number] Arrival date & time: 08/04/20  1532     History Chief Complaint  Patient presents with  . Dizziness    Pt stated dizziness started around this afternoon. Pt states that she took potassium medication, PO to help. Patient also noted that there was tingling in right arm and also stated that she felt hot when the symptoms started.     Victoria Cardenas is a 70 y.o. female.  HPI She is here for evaluation of intermittent numbness in both arms, starting about a week ago.  She saw her PCP who referred her to neurology but she has not seen them yet.  She denies head trauma.  She denies weaknesses.  She has not had any cough, fever, vomiting, anorexia, or trouble walking.  She decided come here for evaluation by EMS.  She lives with her son.  There are no other known modifying factors.    Past Medical History:  Diagnosis Date  . Breast cancer ([place] [facility]) 2005   right breast  . Glaucoma   . Hypercholesteremia   . Hypertension   . Medication intolerance    multiple  . Mild mitral regurgitation   . NSVT (nonsustained ventricular tachycardia) ([place])   . Shortened PR interval   . SVT (supraventricular tachycardia) ([facility])   . Thyroid disease     Patient Active Problem List   Diagnosis Date Noted  . Graves disease 04/04/2019  . Multinodular goiter 01/20/2019  . Subclinical hyperthyroidism 01/20/2019  . Hyperthyroidism 09/27/2018  . Sore throat, chronic 09/27/2018  . Heart palpitations 12/23/2016  . Essential hypertension 12/23/2016  . Hyperlipidemia 12/23/2016  . Hyperglycemia 12/23/2016  . Eustachian tube dysfunction, right 02/27/2016  . Ear pressure, right 02/06/2016  . 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 ABDOMINAL HYSTERECTOMY    . BREAST EXCISIONAL BIOPSY Left 10/10/2015   benign  . 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     Family History  Problem Relation Age of Onset  . Hypertension Mother   . Breast cancer Mother 54  . Breast cancer Maternal Aunt   . Breast cancer Maternal Grandmother     Social History   Tobacco Use  . Smoking status: Never Smoker  . Smokeless tobacco: Never Used  Vaping Use  . Vaping Use: Never used  Substance Use Topics  . Alcohol use: No  . Drug use: No    Home Medications Prior to Admission medications   Medication Sig Start Date End Date Taking? Authorizing Provider  aspirin EC 81 MG tablet Take 81 mg by mouth every other day.    [provider]  brimonidine (ALPHAGAN P) 0.1 % SOLN Place into both eyes 2 (two) times daily.    [provider]  cholecalciferol (VITAMIN D3) 25 MCG (1000 UT) tablet Take 1,000 Units by mouth daily.    [provider]  LUMIGAN 0.01 % SOLN Place 1 drop into both eyes at bedtime. 05/14/18   [provider]  methimazole (TAPAZOLE) 5 MG tablet Take 0.5 tablets (2.5 mg total) by mouth daily. 05/11/20   Shamleffer, Melanie Crazier, MD  potassium chloride 20 MEQ/15ML (10%) SOLN Take 15 mLs (20 mEq total) by mouth daily. 06/12/20   Dorothy Spark, MD  Red Yeast Rice 600 MG TABS Take 1 tablet (600  mg total) by mouth daily. 04/10/20   Dorothy Spark, MD    Allergies    Amlodipine, Penicillins, Cardizem cd [diltiazem], Lisinopril, Losartan, Propranolol, Rosuvastatin, Ciprofloxacin, and Simvastatin  Review of Systems   Review of Systems  All other systems reviewed and are negative.   Physical Exam Updated Vital Signs There were no vitals taken for this visit.  Physical Exam Vitals and nursing note reviewed.  Constitutional:      General: She is not in acute distress.    Appearance: She is well-developed. She is not ill-appearing, toxic-appearing or diaphoretic.  HENT:     Head: Normocephalic and atraumatic.     Right Ear: External ear normal.      Left Ear: External ear normal.  Eyes:     Conjunctiva/sclera: Conjunctivae normal.     Pupils: Pupils are equal, round, and reactive to light.  Neck:     Trachea: Phonation normal.  Cardiovascular:     Rate and Rhythm: Normal rate and regular rhythm.     Heart sounds: Normal heart sounds.  Pulmonary:     Effort: Pulmonary effort is normal.     Breath sounds: Normal breath sounds.  Abdominal:     Palpations: Abdomen is soft.     Tenderness: There is no abdominal tenderness.  Musculoskeletal:        General: Normal range of motion.     Cervical back: Normal range of motion and neck supple.  Skin:    General: Skin is warm and dry.  Neurological:     Mental Status: She is alert and oriented to person, place, and time.     Cranial Nerves: No cranial nerve deficit.     Sensory: No sensory deficit.     Motor: No abnormal muscle tone.     Coordination: Coordination normal.     Comments: No dysarthria, or aphasia.  Normal gait.  No ataxia.  Intact light touch into the fingers of both hands.  Psychiatric:        Mood and Affect: Mood normal.        Behavior: Behavior normal.        Thought Content: Thought content normal.        Judgment: Judgment normal.     ED Results / Procedures / Treatments   Labs (all labs ordered are listed, but only abnormal results are displayed) Labs Reviewed - No data to display  EKG EKG Interpretation  Date/Time:  Saturday August 04 2020 15:39:50 EDT Ventricular Rate:  57 PR Interval:  97 QRS Duration: 93 QT Interval:  455 QTC Calculation: 443 R Axis:   42 Text Interpretation: Sinus rhythm Short PR interval LAE, consider biatrial enlargement Minimal ST depression, inferior leads since last tracing no significant change Confirmed by Daleen Bo [phone]) on 08/04/2020 3:43:59 PM   Radiology No results found.  Procedures Procedures   Medications Ordered in ED Medications - No data to display  ED Course  I have reviewed the triage vital  signs and the nursing notes.  Pertinent labs & imaging results that were available during my care of the patient were reviewed by me and considered in my medical decision making (see chart for details).    MDM Rules/Calculators/A&P                           Patient Vitals for the past 24 hrs:  BP Pulse Resp SpO2  08/04/20 1545 (!) 213/98 (!) 55 16 100 %  08/04/20 1544 -- -- -- 100 %    6:32 PM Reevaluation with update and discussion. After initial assessment and treatment, an updated evaluation reveals she is comfortable with no further complaints.  Findings discussed and questions answered. Daleen Bo   Medical Decision Making:  This patient is presenting for evaluation of intermittent numbness of the arms, without headache or neck pain, which does require a range of treatment options, and is a complaint that involves a moderate risk of morbidity and mortality. The differential diagnoses include CVA, cervical radiculopathy, polyneuropathy, hypokalemia, hypertension. I decided to review old records, and in summary elderly female presenting with nonspecific and intermittent symptoms, requiring evaluation in the ED..  I did not require additional historical information from Anyone.  Clinical Laboratory Tests Ordered, included CBC, Metabolic panel, Urinalysis and Magnesium. Review indicates essentially normal. Radiologic Tests Ordered, included CT head.  I independently Visualized: Radiographic images, which show no acute abnormalities    Critical Interventions-clinical evaluation, laboratory testing, radiography, observation and reassessment  After These Interventions, the Patient was reevaluated and was found clinical evaluation is reassuring.  Patient with varying bilateral arm numbness, but no clear central symptoms.  Evaluation ED is completely normal.  Patient's PCP is already evaluating this problem and has referred her to a neurologist.  Patient has not yet seen neurology.  She  understands that she needs to work on getting into see the neurologist, by utilizing her physician's referral.  CRITICAL CARE-no Performed by: Daleen Bo  Nursing Notes Reviewed/ Care Coordinated Applicable Imaging Reviewed Interpretation of Laboratory Data incorporated into ED treatment  The patient appears reasonably screened and/or stabilized for discharge and I doubt any other medical condition or other [facility] requiring further screening, evaluation, or treatment in the ED at this time prior to discharge.  Plan: Home Medications-continue; Home Treatments-rest, fluids; return here if the recommended treatment, does not improve the symptoms; Recommended follow up-PCP and neurology as needed.     Final Clinical Impression(s) / ED Diagnoses Final diagnoses:  Paresthesia    Rx / DC Orders ED Discharge Orders    None       Daleen Bo, MD 08/06/20 1139

## 2020-09-18 IMAGING — US US THYROID
1 series · 13 of 25 positions shown · non-contrast
Comparison: May 07, 2016

CLINICAL DATA: Multinodular goiter

EXAM:
THYROID ULTRASOUND
TECHNIQUE: Ultrasound examination of the thyroid gland and adjacent soft
tissues was performed.

[Series 1: us thyroid · 0.05mm/px · 13 of 59 slices shown]
[im 1/59]
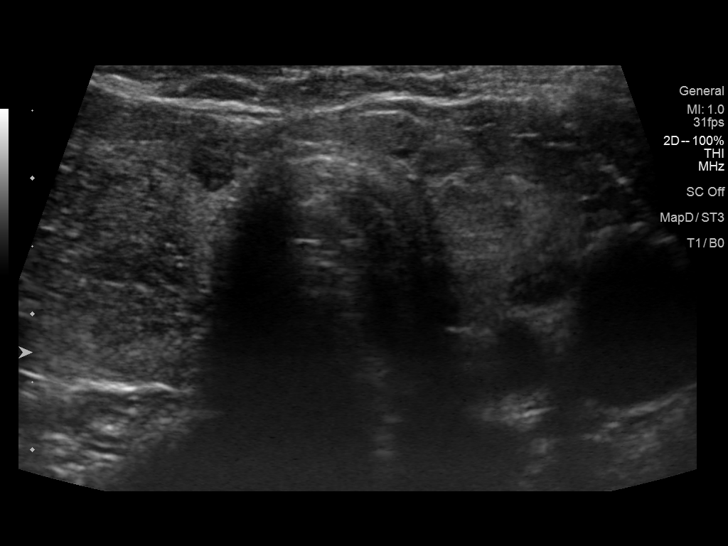
[im 5/59]
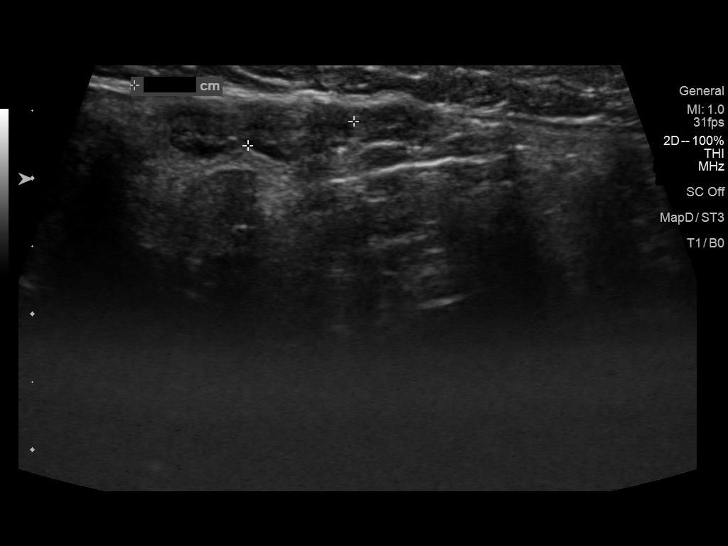
[im 10/59]
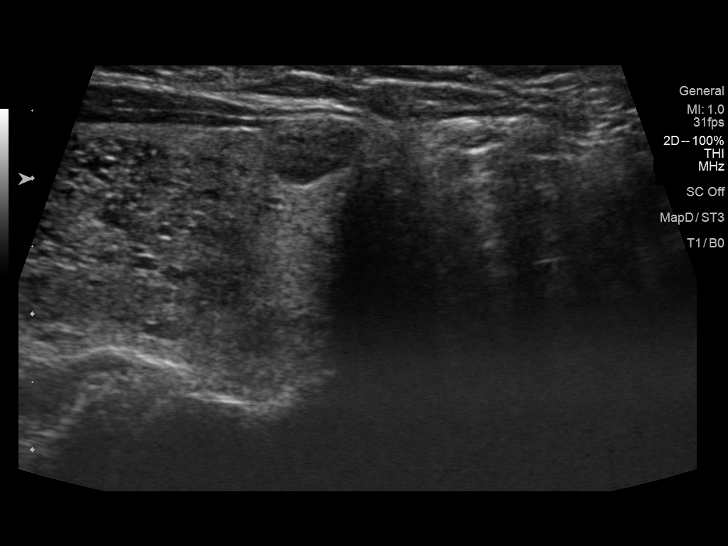
[im 15/59]
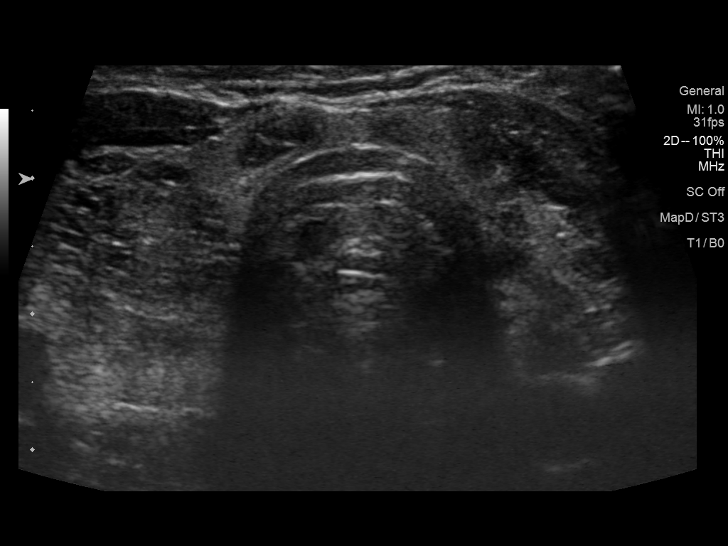
[im 20/59]
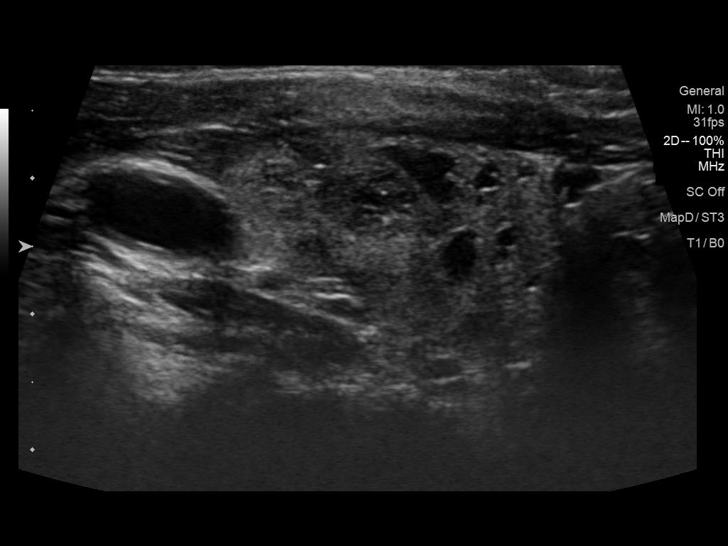
[im 25/59]
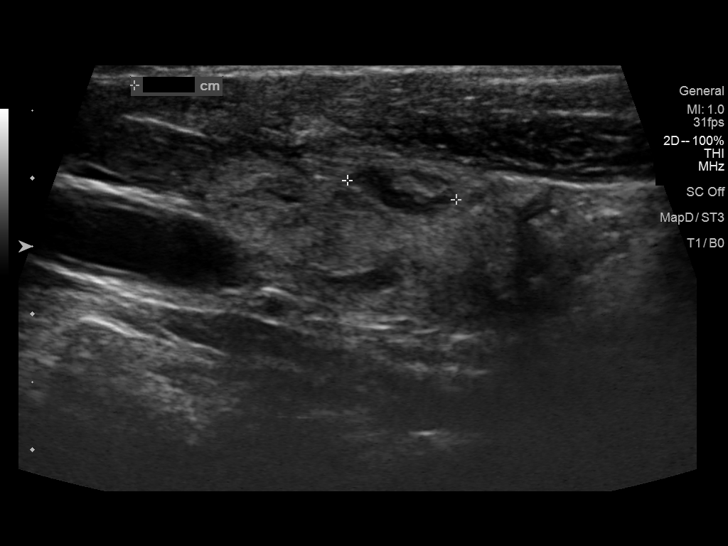
[im 30/59]
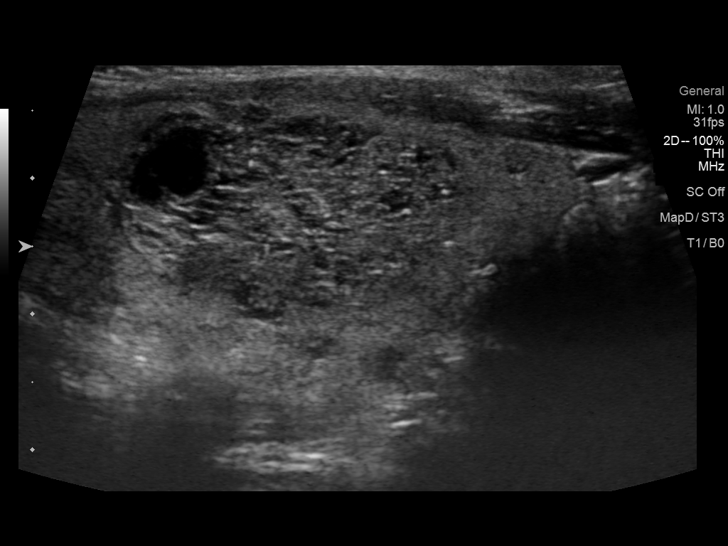
[im 34/59]
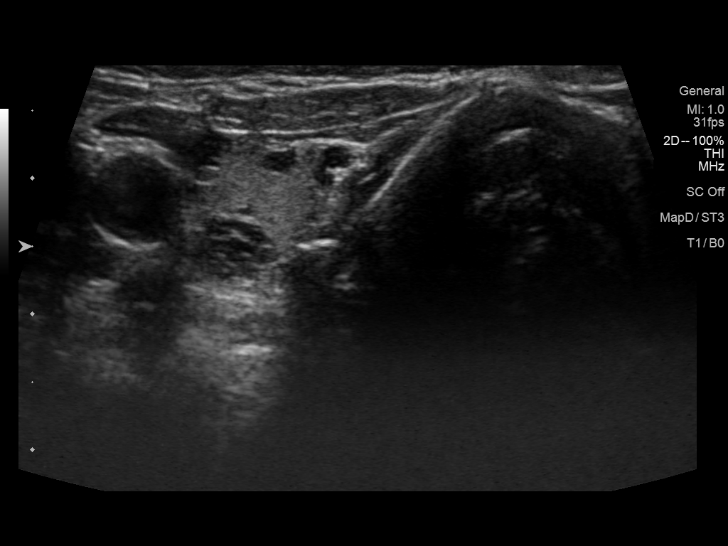
[im 39/59]
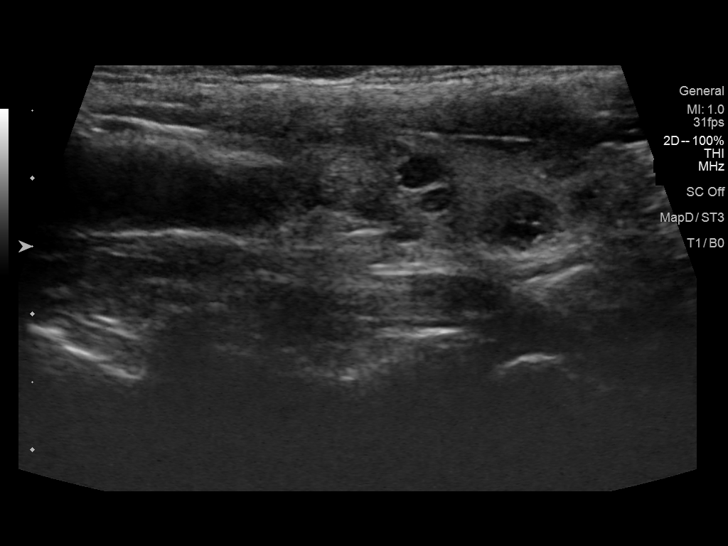
[im 44/59]
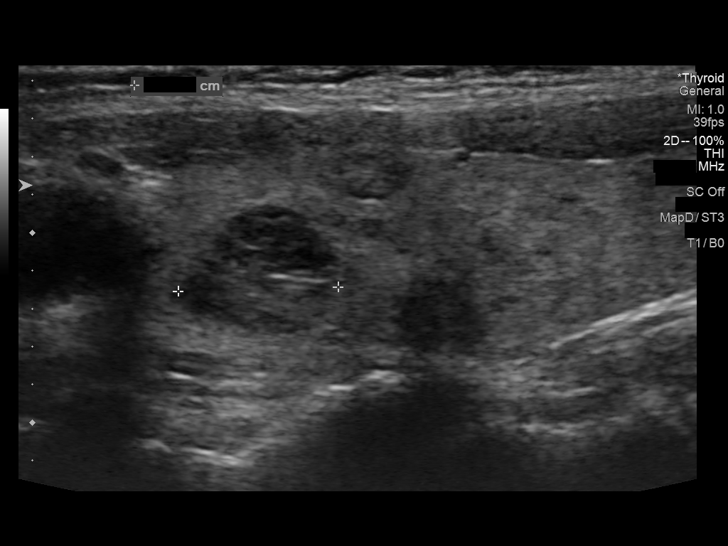
[im 49/59]
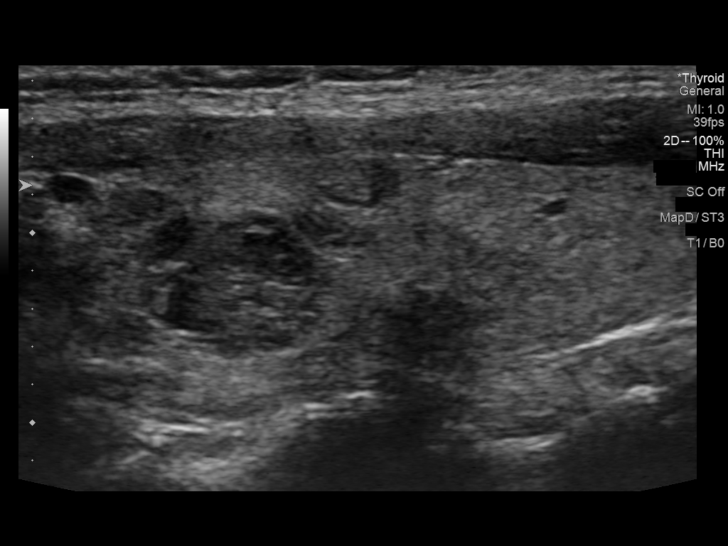
[im 54/59]
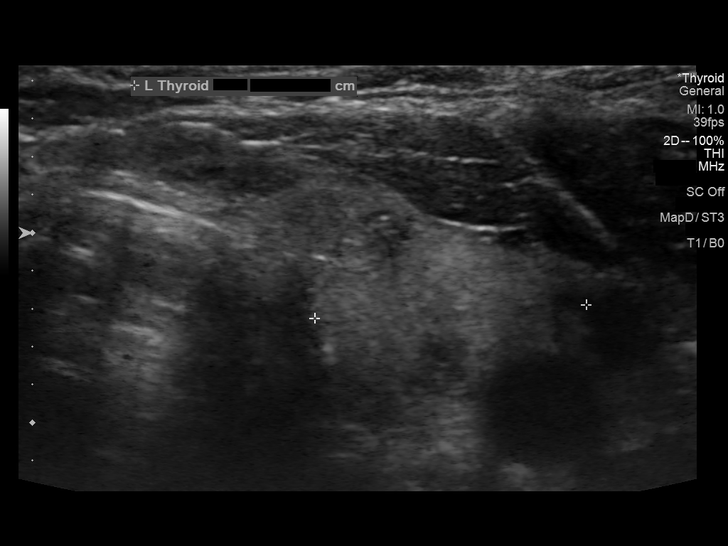
[im 59/59]
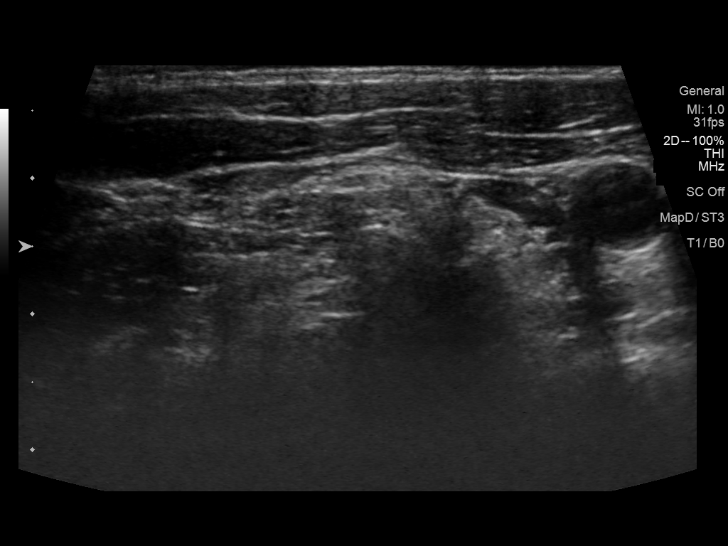

[13 of 25 positions shown; findings below may reference images not displayed]

FINDINGS: Parenchymal Echotexture: Moderately heterogenous

Isthmus: 0.2 cm

Right lobe: 4.7 x 1.9 x 1.9 cm

Left lobe: 4.2 x 1.4 x 1.4 cm

_________________________________________________________

Estimated total number of nodules >/= 1 cm: 1

Number of spongiform nodules >/=  2 cm not described below (TR1): 0

Number of mixed cystic and solid nodules >/= 1.5 cm not described
below (TR2): 0

_________________________________________________________

Multiple small thyroid nodules are noted bilaterally, all of which
measure less than 1 cm and are essentially stable from prior study.
The dominant 2.9 cm thyroid nodule in the right mid thyroid gland is
stable from prior study and was previously biopsy. There are no new
concerning thyroid nodules bilaterally. There are no pathologically
enlarged lymph nodes. There is a new 0.8 cm thyroid nodule in the
mid isthmus that does not meet criteria for follow-up or FNA.
IMPRESSION: 1. Borderline enlarged multinodular thyroid gland as detailed above.
2. Stable dominant 2.9 cm thyroid nodule in the right mid thyroid
gland that was previously biopsied.
3. Additional small subcentimeter essentially stable thyroid
nodules, none of which meet criteria for follow-up.

The above is in keeping with the ACR TI-RADS recommendations - [HOSPITAL] 3176;[DATE].

## 2020-10-10 NOTE — Progress Notes (Deleted)
Cardiology Office Note:    Date:  10/10/2020   ID:  Victoria Cardenas, DOB Jul 12, 1950, MRN [id number]  PCP:  Cipriano Mile, NP   [facility] Providers Cardiologist:  Ena Dawley, MD (Inactive) {  Referring MD: Cipriano Mile, NP     History of Present Illness:    Victoria Cardenas is a 70 y.o. female with a hx of HTN, palpitations/SVT and Graves' disease who was previously followed by Dr. [name] who now returns to clinic for follow-up of her SVT and HTN.  Was last seen by Dr. [name] on 04/10/20 with hypertensive urgency. Was not on medications at that time due to multiple medication intolerances. During that visit, she was improved with well controlled blood pressures.  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     Allergies:   Amlodipine, Penicillins, Cardizem cd [diltiazem], Lisinopril, Losartan, Propranolol, Rosuvastatin, Ciprofloxacin, and Simvastatin   Social History   Socioeconomic History   Marital status: Single    Spouse name: Not on file   Number of children: Not on file   Years of education: Not on file   Highest education level: Not on file  Occupational History   Not on file  Tobacco Use   Smoking status: Never   Smokeless tobacco: Never  Vaping Use   Vaping Use: Never used  Substance and Sexual Activity   Alcohol use: No   Drug  use: No   Sexual activity: Not on file  Other Topics Concern   Not on file  Social History Narrative   Not on file   Social Determinants of Health   Financial Resource Strain: Not on file  Food Insecurity: Not on file  Transportation Needs: Not on file  Physical Activity: Not on file  Stress: Not on file  Social Connections: Not on file     Family History: The patient's ***family history includes Breast cancer in her maternal aunt and maternal grandmother; Breast cancer (age of onset: 70) in her mother; Hypertension in her mother.  ROS:   Please see the history of present illness.    *** All other systems reviewed and are negative.  EKGs/Labs/Other Studies Reviewed:    The following studies were reviewed today: ECHO IMPRESSIONS 09/2018    1. The left ventricle has normal systolic function with an ejection  fraction of 60-65%. The cavity size was normal. Left ventricular diastolic  parameters were normal. No evidence of left ventricular regional wall  motion abnormalities.   2. The right ventricle has normal systolic function. The cavity was  normal. There is no increase in right ventricular wall thickness. Right  ventricular systolic pressure could not be assessed.   3. Mild thickening of the mitral valve leaflet. The MR jet is  centrally-directed.   4. Mild thickening of the aortic valve.  No stenosis of the aortic valve.  Cardiac Monitor 09/09/18:  Sinus tach cardia to sinus tachycardia. 1 episode of ventricular tachycardia for total of 9 beats with ventricular rate 170 bpm. 43 episodes of SVTs some of which seem to represent atrial fibrillation.   Sinus tach cardia to sinus tachycardia. 1 episode of ventricular tachycardia for total of 9 beats with ventricular rate 170 bpm. 43 episodes of SVTs some of which seem to represent atrial fibrillation.  EKG:  EKG is *** ordered today.  The ekg ordered today demonstrates ***  Recent Labs: 05/11/2020: TSH 2.27 08/04/2020: BUN  9; Creatinine, Ser 0.69; Hemoglobin 14.7; Magnesium 1.9; Platelets 214; Potassium 3.6; Sodium 140  Recent Lipid Panel    Component Value Date/Time   CHOL 200 (H) 04/14/2019 0950   TRIG 133 04/14/2019 0950   HDL 36 (L) 04/14/2019 0950   CHOLHDL 5.6 (H) 04/14/2019 0950   LDLCALC 140 (H) 04/14/2019 0950     Risk Assessment/Calculations:   {Does this patient have ATRIAL FIBRILLATION?:[id number]}   Physical Exam:    VS:  There were no vitals taken for this visit.    Wt Readings from Last 3 Encounters:  05/11/20 121 lb (54.9 kg)  04/10/20 119 lb 12.8 oz (54.3 kg)  03/27/20 120 lb (54.4 kg)     GEN: *** Well nourished, well developed in no acute distress HEENT: Normal NECK: No JVD; No carotid bruits LYMPHATICS: No lymphadenopathy CARDIAC: ***RRR, no murmurs, rubs, gallops RESPIRATORY:  Clear to auscultation without rales, wheezing or rhonchi  ABDOMEN: Soft, non-tender, non-distended MUSCULOSKELETAL:  No edema; No deformity  SKIN: Warm and dry NEUROLOGIC:  Alert and oriented x 3 PSYCHIATRIC:  Normal affect   ASSESSMENT:    No diagnosis found. PLAN:    In order of problems listed above:  #Hypertension: Controlled. Not on meds currently. Has multiple medication intolerances. -Low na diet  #Graves Disease: Controlled with normal TSH free T3 and T4.  #History of SVT: TTE in 2020 with LVEF 60-65% and no significant valve disease. Symptoms significantly improved.  #HLD: Intolerant of statins. Did not take red yeast rice due to concern for side-effects to the ingredients.  -Trial of zetia 10mg  daily    {Are you ordering a CV Procedure (e.g. stress test, cath, DCCV, TEE, etc)?   Press F2        :[id number]}    Medication Adjustments/Labs and Tests Ordered: Current medicines are reviewed at length with the patient today.  Concerns regarding medicines are outlined above.  No orders of the defined types were placed in this encounter.  No orders of the defined types  were placed in this encounter.   There are no Patient Instructions on file for this visit.   Signed, Freada Bergeron, MD  10/10/2020 2:58 PM    [place]

## 2020-10-22 ENCOUNTER — Other Ambulatory Visit: Payer: Self-pay

## 2020-10-22 ENCOUNTER — Other Ambulatory Visit: Payer: Self-pay | Admitting: *Deleted

## 2020-10-22 ENCOUNTER — Encounter: Payer: Self-pay | Admitting: Cardiology

## 2020-10-22 ENCOUNTER — Ambulatory Visit (INDEPENDENT_AMBULATORY_CARE_PROVIDER_SITE_OTHER): Payer: Medicare Other | Admitting: Cardiology

## 2020-10-22 VITALS — BP 138/80 | HR 67 | Ht 63.0 in | Wt 114.0 lb

## 2020-10-22 DIAGNOSIS — E785 Hyperlipidemia, unspecified: Secondary | ICD-10-CM | POA: Diagnosis not present

## 2020-10-22 DIAGNOSIS — Z79899 Other long term (current) drug therapy: Secondary | ICD-10-CM

## 2020-10-22 DIAGNOSIS — I10 Essential (primary) hypertension: Secondary | ICD-10-CM

## 2020-10-22 DIAGNOSIS — I471 Supraventricular tachycardia: Secondary | ICD-10-CM

## 2020-10-22 DIAGNOSIS — R002 Palpitations: Secondary | ICD-10-CM

## 2020-10-22 DIAGNOSIS — E0590 Thyrotoxicosis, unspecified without thyrotoxic crisis or storm: Secondary | ICD-10-CM

## 2020-10-22 MED ORDER — EZETIMIBE 10 MG PO TABS: 10.0000 mg | ORAL_TABLET | Freq: Every day | ORAL | 3 refills | Status: DC

## 2020-10-22 NOTE — Telephone Encounter (Signed)
Yes ok to refill.  Thank you so much!

## 2020-10-22 NOTE — Patient Instructions (Signed)
Medication Instructions:   START TAKING ZETIA 10 MG BY MOUTH DAILY  *If you need a refill on your cardiac medications before your next appointment, please call your pharmacy*   Lab Work:  [place] OFFICE--CHECK LIPIDS--PLEASE COME FASTING TO THIS LAB APPOINTMENT  If you have labs (blood work) drawn today and your tests are completely normal, you will receive your results only by: [place] (if you have MyChart) OR A paper copy in the mail If you have any lab test that is abnormal or we need to change your treatment, we will call you to review the results.   Follow-Up: At [facility], you and your health needs are our priority.  As part of our continuing mission to provide you with exceptional heart care, we have created designated Provider Care Teams.  These Care Teams include your primary Cardiologist (physician) and Advanced Practice Providers (APPs -  Physician Assistants and Nurse Practitioners) who all work together to provide you with the care you need, when you need it.  We recommend signing up for the patient portal called "MyChart".  Sign up information is provided on this After Visit Summary.  MyChart is used to connect with patients for Virtual Visits (Telemedicine).  Patients are able to view lab/test results, encounter notes, upcoming appointments, etc.  Non-urgent messages can be sent to your provider as well.   To learn more about what you can do with MyChart, go to [email or web address].    Your next appointment:   6 - 8 month(s)  The format for your next appointment:   In Person  Provider:   Gwyndolyn Kaufman, MD OR AN EXTENDER

## 2020-10-22 NOTE — Progress Notes (Deleted)
Cardiology Office Note:    Date:  10/22/2020   ID:  Victoria Cardenas, DOB 04/06/1951, MRN [id number]  PCP:  Cipriano Mile, NP   [facility] Providers Cardiologist:  Ena Dawley, MD (Inactive) {  Referring MD: Cipriano Mile, NP     History of Present Illness:    Victoria Cardenas is a 70 y.o. female with a hx of HTN, palpitations/SVT and Graves' disease who was previously followed by Dr. [name] who now returns to clinic for follow-up of her SVT and HTN.  Was last seen by Dr. [name] on 04/10/20 with hypertensive urgency. Was not on medications at that time due to multiple medication intolerances. During that visit, she was improved with well controlled blood pressures.  Today, she is feeling okay overall. She reports her blood pressure continues to fluctuate. At home it is averaging higher on the weekend. Typically her blood pressure is more often low, sometimes in the 90s. If her blood pressure is too low she may feel palpitations. Currently she is not taking medication for her blood pressure due to side effects from multiple antihypertensives. She has been taking potassium supplements for a year, and she has noticed her blood pressure lowering slightly due to this medication.      Lately, her Graves' disease has been stable. She is scheduled to see her PCP tomorrow, and they will perform labs at that time.  She denies any chest pain, shortness of breath, or exertional symptoms. No headaches, lightheadedness, or syncope to report. Also has no lower extremity edema, orthopnea or PND.   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   cholecalciferol (VITAMIN D3) 25 MCG (1000 UT) tablet Take 1,000 Units by mouth daily.   ezetimibe (ZETIA) 10 MG tablet Take 1 tablet (10 mg total) by mouth daily.   LUMIGAN 0.01 % SOLN Place 1 drop into both eyes at bedtime.   methimazole (TAPAZOLE) 5 MG tablet Take 0.5 tablets (2.5 mg total) by mouth daily.   potassium chloride 20 MEQ/15ML (10%) SOLN Take 15 mLs (20 mEq total) by mouth daily.     Allergies:   Amlodipine, Penicillins, Cardizem cd [diltiazem], Lisinopril, Losartan, Propranolol, Rosuvastatin, Ciprofloxacin, and Simvastatin   Social History   Socioeconomic History   Marital status: Single    Spouse name: Not on file   Number of children: Not on file   Years of education: Not on file   Highest education level: Not on file  Occupational History   Not on file  Tobacco Use   Smoking status: Never   Smokeless tobacco: Never  Vaping Use   Vaping Use: Never used  Substance and Sexual Activity   Alcohol use: No   Drug use: No   Sexual activity: Not on file  Other Topics Concern  Not on file  Social History Narrative   Not on file   Social Determinants of Health   Financial Resource Strain: Not on file  Food Insecurity: Not on file  Transportation Needs: Not on file  Physical Activity: Not on file  Stress: Not on file  Social Connections: Not on file     Family History: The patient's family history includes Breast cancer in her maternal aunt and maternal grandmother; Breast cancer (age of onset: 8) in her mother; Hypertension in her mother.  ROS:   Review of Systems  Constitutional:  Negative for fever and weight loss.  HENT:  Negative for congestion and tinnitus.   Eyes:  Negative for photophobia and discharge.   Respiratory:  Negative for cough and hemoptysis.   Cardiovascular:  Positive for palpitations. Negative for chest pain, orthopnea, claudication, leg swelling and PND.  Gastrointestinal:  Negative for heartburn, nausea and vomiting.  Genitourinary:  Negative for frequency and urgency.  Musculoskeletal:  Negative for myalgias and neck pain.  Neurological:  Negative for tremors, seizures and headaches.  Endo/Heme/Allergies:  Does not bruise/bleed easily.  Psychiatric/Behavioral:  Negative for depression and memory loss.     EKGs/Labs/Other Studies Reviewed:    The following studies were reviewed today: ECHO IMPRESSIONS 09/2018    1. The left ventricle has normal systolic function with an ejection  fraction of 60-65%. The cavity size was normal. Left ventricular diastolic  parameters were normal. No evidence of left ventricular regional wall  motion abnormalities.   2. The right ventricle has normal systolic function. The cavity was  normal. There is no increase in right ventricular wall thickness. Right  ventricular systolic pressure could not be assessed.   3. Mild thickening of the mitral valve leaflet. The MR jet is  centrally-directed.   4. Mild thickening of the aortic valve. No stenosis of the aortic valve.  Cardiac Monitor 09/09/18:  Sinus tach cardia to sinus tachycardia. 1 episode of ventricular tachycardia for total of 9 beats with ventricular rate 170 bpm. 43 episodes of SVTs some of which seem to represent atrial fibrillation.   Sinus tach cardia to sinus tachycardia. 1 episode of ventricular tachycardia for total of 9 beats with ventricular rate 170 bpm. 43 episodes of SVTs some of which seem to represent atrial fibrillation.  EKG:   10/22/2020: EKG is not ordered today.   Recent Labs: 05/11/2020: TSH 2.27 08/04/2020: BUN 9; Creatinine, Ser 0.69; Hemoglobin 14.7; Magnesium 1.9; Platelets 214; Potassium 3.6; Sodium 140  Recent Lipid Panel    Component Value Date/Time    CHOL 200 (H) 04/14/2019 0950   TRIG 133 04/14/2019 0950   HDL 36 (L) 04/14/2019 0950   CHOLHDL 5.6 (H) 04/14/2019 0950   LDLCALC 140 (H) 04/14/2019 0950     Risk Assessment/Calculations:       Physical Exam:    VS:  BP 138/80   Pulse 67   Ht 5\' 3"  (1.6 m)   Wt 114 lb (51.7 kg)   SpO2 98%   BMI 20.19 kg/m     Wt Readings from Last 3 Encounters:  10/22/20 114 lb (51.7 kg)  05/11/20 121 lb (54.9 kg)  04/10/20 119 lb 12.8 oz (54.3 kg)     GEN: Well nourished, well developed in no acute distress HEENT: Normal NECK: No JVD; No carotid bruits LYMPHATICS: No lymphadenopathy CARDIAC: RRR, no murmurs, rubs, gallops RESPIRATORY:  Clear to auscultation without rales, wheezing or rhonchi  ABDOMEN: Soft, non-tender, non-distended MUSCULOSKELETAL:  No edema; No  deformity  SKIN: Warm and dry NEUROLOGIC:  Alert and oriented x 3 PSYCHIATRIC:  Normal affect   ASSESSMENT:    1. Essential hypertension   2. Medication management   3. Hyperlipidemia, unspecified hyperlipidemia type   4. SVT (supraventricular tachycardia) ([place])   5. Heart palpitations   6. Hyperthyroidism    PLAN:    In order of problems listed above:  #Hypertension: Controlled with episodes of reported lows at home with systolic in 62H. Not on meds currently. Has multiple medication intolerances. -Continue low Na diet -Not on antihypertensives for now; monitoring off medications and overall fairly well controlled  #Graves Disease: Controlled with normal TSH free T3 and T4 in 04/2020. -Follow-up with PCP as scheduled  #History of SVT: TTE in 2020 with LVEF 60-65% and no significant valve disease. Symptoms significantly improved. -Continue to monitor  #HLD: Intolerant of statins. Was supposed to start red yeast rice but patient was concerned about side-effects. Amenable to a trial of zetia. -Start zetia 10mg  daily -Repeat lipids in 6 weeks   Follow-up in 6-8 months.  Medication Adjustments/Labs and  Tests Ordered: Current medicines are reviewed at length with the patient today.  Concerns regarding medicines are outlined above.  Orders Placed This Encounter  Procedures   Lipid Profile   Meds ordered this encounter  Medications   ezetimibe (ZETIA) 10 MG tablet    Sig: Take 1 tablet (10 mg total) by mouth daily.    Dispense:  90 tablet    Refill:  3    Patient Instructions  Medication Instructions:   START TAKING ZETIA 10 MG BY MOUTH DAILY  *If you need a refill on your cardiac medications before your next appointment, please call your pharmacy*   Lab Work:  [place] OFFICE--CHECK LIPIDS--PLEASE COME FASTING TO THIS LAB APPOINTMENT  If you have labs (blood work) drawn today and your tests are completely normal, you will receive your results only by: [place] (if you have MyChart) OR A paper copy in the mail If you have any lab test that is abnormal or we need to change your treatment, we will call you to review the results.   Follow-Up: At [facility], you and your health needs are our priority.  As part of our continuing mission to provide you with exceptional heart care, we have created designated Provider Care Teams.  These Care Teams include your primary Cardiologist (physician) and Advanced Practice Providers (APPs -  Physician Assistants and Nurse Practitioners) who all work together to provide you with the care you need, when you need it.  We recommend signing up for the patient portal called "MyChart".  Sign up information is provided on this After Visit Summary.  MyChart is used to connect with patients for Virtual Visits (Telemedicine).  Patients are able to view lab/test results, encounter notes, upcoming appointments, etc.  Non-urgent messages can be sent to your provider as well.   To learn more about what you can do with MyChart, go to [email or web address].    Your next appointment:   6 - 8 month(s)  The format for your next  appointment:   In Person  Provider:   Gwyndolyn Kaufman, MD OR AN EXTENDER     I,Mathew Stumpf,acting as a scribe for Freada Bergeron, MD.,have documented all relevant documentation on the behalf of Freada Bergeron, MD,as directed by  Freada Bergeron, MD while in the presence of Freada Bergeron, MD.  I, Freada Bergeron, MD,  have reviewed all documentation for this visit. The documentation on 10/22/20 for the exam, diagnosis, procedures, and orders are all accurate and complete.   Signed, Freada Bergeron, MD  10/22/2020 1:07 PM    [place]

## 2020-10-23 MED ORDER — POTASSIUM CHLORIDE 20 MEQ/15ML (10%) PO SOLN
20.0000 meq | Freq: Every day | ORAL | 2 refills | Status: DC
Start: 2020-10-23 — End: 2020-12-25

## 2020-10-23 NOTE — Progress Notes (Signed)
Cardiology Office Note:    Date:  10/23/2020   ID:  Victoria Cardenas, DOB May 16, 1950, MRN [id number]  PCP:  Cipriano Mile, NP   [facility] Providers Cardiologist:  Ena Dawley, MD (Inactive) {  Referring MD: Cipriano Mile, NP     History of Present Illness:    Victoria Cardenas is a 70 y.o. female with a hx of HTN, palpitations/SVT and Graves' disease who was previously followed by Dr. [name] who now returns to clinic for follow-up of her SVT and HTN.  Was last seen by Dr. [name] on 04/10/20 with hypertensive urgency. Was not on medications at that time due to multiple medication intolerances. During that visit, she was improved with well controlled blood pressures.  Today, she is feeling okay overall. She reports her blood pressure continues to fluctuate. At home it is averaging higher on the weekend. Typically her blood pressure is more often low, sometimes in the 90s. If her blood pressure is too low she may feel palpitations. Currently she is not taking medication for her blood pressure due to side effects from multiple antihypertensives. She has been taking potassium supplements for a year, and she has noticed her blood pressure lowering slightly due to this medication.      Lately, her Graves' disease has been stable. She is scheduled to see her PCP tomorrow, and they will perform labs at that time.  She denies any chest pain, shortness of breath, or exertional symptoms. No headaches, lightheadedness, or syncope to report. Also has no lower extremity edema, orthopnea or PND.   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   cholecalciferol (VITAMIN D3) 25 MCG (1000 UT) tablet Take 1,000 Units by mouth daily.   ezetimibe (ZETIA) 10 MG tablet Take 1 tablet (10 mg total) by mouth daily.   LUMIGAN 0.01 % SOLN Place 1 drop into both eyes at bedtime.   methimazole (TAPAZOLE) 5 MG tablet Take 0.5 tablets (2.5 mg total) by mouth daily.   [DISCONTINUED] potassium chloride 20 MEQ/15ML (10%) SOLN Take 15 mLs (20 mEq total) by mouth daily.     Allergies:   Amlodipine, Penicillins, Cardizem cd [diltiazem], Lisinopril, Losartan, Propranolol, Rosuvastatin, Ciprofloxacin, and Simvastatin   Social History   Socioeconomic History   Marital status: Single    Spouse name: Not on file   Number of children: Not on file   Years of education: Not on file   Highest education level: Not on file  Occupational History   Not on file  Tobacco Use   Smoking status: Never   Smokeless tobacco: Never  Vaping Use   Vaping Use: Never used  Substance and Sexual Activity   Alcohol use: No   Drug use: No   Sexual activity: Not on file  Other Topics Concern  Not on file  Social History Narrative   Not on file   Social Determinants of Health   Financial Resource Strain: Not on file  Food Insecurity: Not on file  Transportation Needs: Not on file  Physical Activity: Not on file  Stress: Not on file  Social Connections: Not on file     Family History: The patient's family history includes Breast cancer in her maternal aunt and maternal grandmother; Breast cancer (age of onset: 61) in her mother; Hypertension in her mother.  ROS:   Review of Systems  Constitutional:  Negative for fever and weight loss.  HENT:  Negative for congestion and tinnitus.   Eyes:  Negative for photophobia and  discharge.  Respiratory:  Negative for cough and hemoptysis.   Cardiovascular:  Positive for palpitations. Negative for chest pain, orthopnea, claudication, leg swelling and PND.  Gastrointestinal:  Negative for heartburn, nausea and vomiting.  Genitourinary:  Negative for frequency and urgency.  Musculoskeletal:  Negative for myalgias and neck pain.  Neurological:  Negative for tremors, seizures and headaches.  Endo/Heme/Allergies:  Does not bruise/bleed easily.  Psychiatric/Behavioral:  Negative for depression and memory loss.     EKGs/Labs/Other Studies Reviewed:    The following studies were reviewed today: ECHO IMPRESSIONS 09/2018    1. The left ventricle has normal systolic function with an ejection  fraction of 60-65%. The cavity size was normal. Left ventricular diastolic  parameters were normal. No evidence of left ventricular regional wall  motion abnormalities.   2. The right ventricle has normal systolic function. The cavity was  normal. There is no increase in right ventricular wall thickness. Right  ventricular systolic pressure could not be assessed.   3. Mild thickening of the mitral valve leaflet. The MR jet is  centrally-directed.   4. Mild thickening of the aortic valve. No stenosis of the aortic valve.  Cardiac Monitor 09/09/18:  Sinus tach cardia to sinus tachycardia. 1 episode of ventricular tachycardia for total of 9 beats with ventricular rate 170 bpm. 43 episodes of SVTs some of which seem to represent atrial fibrillation.   Sinus tach cardia to sinus tachycardia. 1 episode of ventricular tachycardia for total of 9 beats with ventricular rate 170 bpm. 43 episodes of SVTs some of which seem to represent atrial fibrillation.  EKG:   10/22/2020: EKG is not ordered today.   Recent Labs: 05/11/2020: TSH 2.27 08/04/2020: BUN 9; Creatinine, Ser 0.69; Hemoglobin 14.7; Magnesium 1.9; Platelets 214; Potassium 3.6; Sodium 140  Recent Lipid Panel    Component Value  Date/Time   CHOL 200 (H) 04/14/2019 0950   TRIG 133 04/14/2019 0950   HDL 36 (L) 04/14/2019 0950   CHOLHDL 5.6 (H) 04/14/2019 0950   LDLCALC 140 (H) 04/14/2019 0950     Risk Assessment/Calculations:       Physical Exam:    VS:  BP 138/80   Pulse 67   Ht 5\' 3"  (1.6 m)   Wt 114 lb (51.7 kg)   SpO2 98%   BMI 20.19 kg/m     Wt Readings from Last 3 Encounters:  10/22/20 114 lb (51.7 kg)  05/11/20 121 lb (54.9 kg)  04/10/20 119 lb 12.8 oz (54.3 kg)     GEN: Well nourished, well developed in no acute distress HEENT: Normal NECK: No JVD; No carotid bruits LYMPHATICS: No lymphadenopathy CARDIAC: RRR, no murmurs, rubs, gallops RESPIRATORY:  Clear to auscultation without rales, wheezing or rhonchi  ABDOMEN: Soft, non-tender, non-distended MUSCULOSKELETAL:  No edema; No  deformity  SKIN: Warm and dry NEUROLOGIC:  Alert and oriented x 3 PSYCHIATRIC:  Normal affect   ASSESSMENT:    1. Essential hypertension   2. Medication management   3. Hyperlipidemia, unspecified hyperlipidemia type   4. SVT (supraventricular tachycardia) ([place])   5. Heart palpitations   6. Hyperthyroidism    PLAN:    In order of problems listed above:  #Hypertension: Controlled with episodes of reported lows at home with systolic in 22L. Not on meds currently. Has multiple medication intolerances. -Continue low Na diet -Not on antihypertensives for now; monitoring off medications and overall fairly well controlled  #Graves Disease: Controlled with normal TSH free T3 and T4 in 04/2020. -Follow-up with PCP as scheduled  #History of SVT: TTE in 2020 with LVEF 60-65% and no significant valve disease. Symptoms significantly improved. -Continue to monitor  #HLD: Intolerant of statins. Was supposed to start red yeast rice but patient was concerned about side-effects. Amenable to a trial of zetia. -Start zetia 10mg  daily -Repeat lipids in 6 weeks   Follow-up in 6-8 months.  Medication  Adjustments/Labs and Tests Ordered: Current medicines are reviewed at length with the patient today.  Concerns regarding medicines are outlined above.  Orders Placed This Encounter  Procedures   Lipid Profile   Meds ordered this encounter  Medications   ezetimibe (ZETIA) 10 MG tablet    Sig: Take 1 tablet (10 mg total) by mouth daily.    Dispense:  90 tablet    Refill:  3    Patient Instructions  Medication Instructions:   START TAKING ZETIA 10 MG BY MOUTH DAILY  *If you need a refill on your cardiac medications before your next appointment, please call your pharmacy*   Lab Work:  [place] OFFICE--CHECK LIPIDS--PLEASE COME FASTING TO THIS LAB APPOINTMENT  If you have labs (blood work) drawn today and your tests are completely normal, you will receive your results only by: [place] (if you have MyChart) OR A paper copy in the mail If you have any lab test that is abnormal or we need to change your treatment, we will call you to review the results.   Follow-Up: At [facility], you and your health needs are our priority.  As part of our continuing mission to provide you with exceptional heart care, we have created designated Provider Care Teams.  These Care Teams include your primary Cardiologist (physician) and Advanced Practice Providers (APPs -  Physician Assistants and Nurse Practitioners) who all work together to provide you with the care you need, when you need it.  We recommend signing up for the patient portal called "MyChart".  Sign up information is provided on this After Visit Summary.  MyChart is used to connect with patients for Virtual Visits (Telemedicine).  Patients are able to view lab/test results, encounter notes, upcoming appointments, etc.  Non-urgent messages can be sent to your provider as well.   To learn more about what you can do with MyChart, go to [email or web address].    Your next appointment:   6 - 8 month(s)  The format for  your next appointment:   In Person  Provider:   Gwyndolyn Kaufman, MD OR AN EXTENDER      I,Mathew Stumpf,acting as a scribe for Freada Bergeron, MD.,have documented all relevant documentation on the behalf of Freada Bergeron, MD,as directed by  Freada Bergeron, MD while in the presence of Freada Bergeron, MD.  I, Freada Bergeron,  MD, have reviewed all documentation for this visit. The documentation on 10/23/20 for the exam, diagnosis, procedures, and orders are all accurate and complete.   Signed, Freada Bergeron, MD  10/23/2020 12:35 PM    [place]

## 2020-10-31 ENCOUNTER — Encounter: Payer: Self-pay | Admitting: *Deleted

## 2020-11-05 ENCOUNTER — Ambulatory Visit (INDEPENDENT_AMBULATORY_CARE_PROVIDER_SITE_OTHER): Payer: Medicare Other | Admitting: Diagnostic Neuroimaging

## 2020-11-05 ENCOUNTER — Encounter: Payer: Self-pay | Admitting: Diagnostic Neuroimaging

## 2020-11-05 VITALS — BP 172/86 | HR 63 | Ht 63.0 in | Wt 117.5 lb

## 2020-11-05 DIAGNOSIS — M5412 Radiculopathy, cervical region: Secondary | ICD-10-CM | POA: Diagnosis not present

## 2020-11-05 DIAGNOSIS — M4712 Other spondylosis with myelopathy, cervical region: Secondary | ICD-10-CM | POA: Diagnosis not present
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
## 2020-11-13 NOTE — Progress Notes (Signed)
Name: Victoria Cardenas  MRN/ DOB: [id number], August 23, 1950    Age/ Sex: 70 y.o., female     PCP: Cipriano Mile, NP   Reason for Endocrinology Evaluation: Subclinical hyperthyroidism     Initial Endocrinology Clinic Visit: 08/25/2018    PATIENT IDENTIFIER: Victoria Cardenas is a 70 y.o., female with a past medical history of HTN, Hx of breast cancer (S/P lumpectomy ) and Osteoporosis   . She has followed with [facility] since 08/25/2018 for consultative assistance with management of her Subclinical hyperthyroidism.   HISTORICAL SUMMARY:   Pt was noted with low TSH in 11/2016, at the time she presented to the ED for palpitations.    In April, 2020 she presented again with palpitations and sob and was found to have a TSH of 0.056 uIU/mL with normal T3 and T4. This was attributed to Graves' Disease due to elevated TRAb level of 3.34 IU/L.   - She is S/P benign FNA of the right mid nodule in 2018 , other sub-centimeter nodules present on ultrasound bilaterally but did not require further follow up.  -Repeat thyroid ultrasound in 04/2019 showed stable right mid nodule at 2.9 cm    No FH of thyroid disease.    Osteoporosis : She was diagnosed with osteoporosis 2017, and was started on boniva at the time, but pt stopped it due to palpitations and diarrhea ~ 2 yrs ago.  Currently she is only on Calcium and Vitamin D 2 tablets daily  She declines antiresorptive therapy      SUBJECTIVE:    Today (11/13/2020):  Victoria Cardenas is here for a  4 months follow up visit on subclinical hyperthyroidism secondary to Graves' Disease.    Denies nausea, vomiting  Denies palpitations  Denies any local neck symptoms  Has noted hair loss   Medications: Methimazole 2.5 mg daily     HISTORY:  Past Medical History:  Past Medical History:  Diagnosis Date  . Breast cancer ([place]) 2005   right breast  . Dizziness   . Glaucoma   . Hypercholesteremia   . Hypertension   . Medication  intolerance    multiple  . Mild mitral regurgitation   . NSVT (nonsustained ventricular tachycardia) ([place])   . Paresthesia   . Shortened PR interval   . SVT (supraventricular tachycardia) ([facility])   . Thyroid disease    Past Surgical History:  Past Surgical History:  Procedure Laterality Date  . ABDOMINAL HYSTERECTOMY    . BREAST EXCISIONAL BIOPSY Left 10/10/2015   benign  . 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 She has never used smokeless tobacco. She reports that she does not drink alcohol and does not use drugs. Family History:  Family History  Problem Relation Age of Onset  . Hypertension Mother   . Breast cancer Mother 43  . Breast cancer Maternal Aunt   . Breast cancer Maternal Grandmother      HOME MEDICATIONS: Allergies as of 11/14/2020       Reactions   Amlodipine Swelling, Palpitations   Swelling, heart pounding   Penicillins Hives   Has patient had a PCN reaction causing immediate rash, facial/tongue/throat swelling, SOB or lightheadedness with hypotension: yes Has patient had a PCN reaction causing severe rash involving mucus membranes or skin necrosis: no Has patient had a PCN reaction that required hospitalization: no Has patient had a PCN reaction occurring within the last 10 years: no  If all of the above answers are "NO", then may proceed with Cephalosporin   Cardizem Cd [diltiazem] Other (See Comments)   Pt reports this med causes her fatigue, dizziness, lightheaded, sore throat, and hard swallowing meals.   Lisinopril    Patient refused prior rx due to potential side effects outlined in medication information   Losartan Other (See Comments)   Pt reports causes frequent urination and UTIs   Propranolol Other (See Comments)   Pt reports causes flushing    Rosuvastatin Other (See Comments)   Pt reports caused her to feel nauseated, dizzy, and lightheaded.    Ciprofloxacin Palpitations   Simvastatin  Other (See Comments)   Increased urination        Medication List        Accurate as of November 13, 2020  6:05 PM. If you have any questions, ask your nurse or doctor.          aspirin EC 81 MG tablet Take 81 mg by mouth every other day.   cholecalciferol 25 MCG (1000 UNIT) tablet Commonly known as: VITAMIN D3 Take 1,000 Units by mouth daily.   Lumigan 0.01 % Soln Generic drug: bimatoprost Place 1 drop into both eyes at bedtime.   methimazole 5 MG tablet Commonly known as: TAPAZOLE Take 0.5 tablets (2.5 mg total) by mouth daily.   potassium chloride 20 MEQ/15ML (10%) Soln Take 15 mLs (20 mEq total) by mouth daily.          OBJECTIVE:   PHYSICAL EXAM: VS: BP 138/82   Pulse 70   Ht 5\' 3"  (1.6 m)   Wt 116 lb (52.6 kg)   SpO2 99%   BMI 20.55 kg/m    EXAM: General: Pt appears well and is in NAD  Neck: General: Supple without adenopathy. Thyroid: Thyroid size normal.  No goiter or nodules appreciated.  Lungs: Clear with good BS bilat with no rales, rhonchi, or wheezes  Heart: Auscultation: RRR.  Abdomen: Normoactive bowel sounds, soft, nontender, without masses or organomegaly palpable  Extremities:  BL LE: No pretibial edema normal ROM and strength.  Mental Status: Judgment, insight: Intact Orientation: Oriented to time, place, and person Mood and affect: No depression, anxiety, or agitation     DATA REVIEWED:  Results for Victoria Cardenas, Victoria Cardenas (MRN [id number]) as of 05/11/2020 13:30  Ref. Range 05/11/2020 10:36  TSH Latest Ref Range: 0.35 - 4.50 uIU/mL 2.27  T4,Free(Direct) Latest Ref Range: 0.60 - 1.60 ng/dL 0.86     Results for Victoria Cardenas, Victoria Cardenas (MRN [id number]) as of 10/20/2018 09:48  Ref. Range 09/10/2018 11:13  TRAB Latest Ref Range: <=2.00 IU/L 3.34 (H)   Thyroid Ultrasound 05/03/2019   Multiple small thyroid nodules are noted bilaterally, all of which measure less than 1 cm and are essentially stable from prior study. The dominant 2.9 cm  thyroid nodule in the right mid thyroid gland is stable from prior study and was previously biopsy. There are no new concerning thyroid nodules bilaterally. There are no pathologically enlarged lymph nodes. There is a new 0.8 cm thyroid nodule in the mid isthmus that does not meet criteria for follow-up or FNA.   IMPRESSION: 1. Borderline enlarged multinodular thyroid gland as detailed above. 2. Stable dominant 2.9 cm thyroid nodule in the right mid thyroid gland that was previously biopsied. 3. Additional small subcentimeter essentially stable thyroid nodules, none of which meet criteria for follow-up.    FNA RMP nodule 2.9x1.4x1.8 (11/27/2015)   CONSISTENT WITH BENIGN FOLLICULAR NODULE (  BETHESDA CATEGORY II).  ASSESSMENT / PLAN / RECOMMENDATIONS:   Subclinical Hyperthyroidism Secondary to Graves' Disease:  - Patient is clinically euthyroid - She is tolerating methimazole well.  - No local neck symptoms -She will return for labs as we do not have a phlebotomist today  Medications  Continue  Methimazole 2.5 mg daily     2. Multinodular Goiter    - No local neck symptoms  - She is S/P benign FNA of the right mid nodule in 2017 , other sub-centimeter nodules present on ultrasound bilaterally but did not require further follow up.  -Repeat thyroid ultrasound in 04/2019 showed stable right mid nodule at 2.9 cm - Will order a follow-up ultrasound  3. Graves' Disease:  - No evidence of extra-thyroidal manifestations of graves' disease, pt advised to notify her ophthalmologist of this diagnosis.     F/u in 6 month     Signed electronically by: Mack Guise, MD  [facility]  [facility] [address] [place], [place] [address] Phone: [phone] FAX: [phone]      CC: Cipriano Mile, NP [place] [address] Phone: [phone]  Fax: [phone]   Return to Endocrinology clinic as  below: Future Appointments  Date Time Provider [place]  11/14/2020 10:30 AM Nitara Szczerba, Melanie Crazier, MD [facility] None  12/03/2020  8:45 AM [facility]  12/06/2020 10:30 AM [facility] DX DEXA 1 [facility]  04/15/2021 11:45 AM Liliane Shi, PA-C [facility]

## 2020-11-14 ENCOUNTER — Other Ambulatory Visit: Payer: Self-pay

## 2020-11-14 ENCOUNTER — Encounter: Payer: Self-pay | Admitting: Internal Medicine

## 2020-11-14 ENCOUNTER — Ambulatory Visit (INDEPENDENT_AMBULATORY_CARE_PROVIDER_SITE_OTHER): Payer: Medicare Other | Admitting: Internal Medicine

## 2020-11-14 VITALS — BP 138/82 | HR 70 | Ht 63.0 in | Wt 116.0 lb

## 2020-11-14 DIAGNOSIS — E0590 Thyrotoxicosis, unspecified without thyrotoxic crisis or storm: Secondary | ICD-10-CM

## 2020-11-14 DIAGNOSIS — E042 Nontoxic multinodular goiter: Secondary | ICD-10-CM

## 2020-11-14 DIAGNOSIS — E0500 Thyrotoxicosis with diffuse goiter without thyrotoxic crisis or storm: Secondary | ICD-10-CM | POA: Diagnosis not present

## 2020-11-22 ENCOUNTER — Other Ambulatory Visit (INDEPENDENT_AMBULATORY_CARE_PROVIDER_SITE_OTHER): Payer: Medicare Other

## 2020-11-22 ENCOUNTER — Other Ambulatory Visit: Payer: Self-pay

## 2020-11-22 DIAGNOSIS — E0500 Thyrotoxicosis with diffuse goiter without thyrotoxic crisis or storm: Secondary | ICD-10-CM | POA: Diagnosis not present

## 2020-11-22 LAB — T4, FREE: Free T4: 0.88 ng/dL (ref 0.60–1.60)

## 2020-11-22 LAB — TSH: TSH: 2.31 u[IU]/mL (ref 0.35–5.50)

## 2020-11-23 ENCOUNTER — Encounter: Payer: Self-pay | Admitting: Internal Medicine

## 2020-11-23 ENCOUNTER — Ambulatory Visit: Payer: Self-pay | Admitting: Surgery

## 2020-12-03 ENCOUNTER — Other Ambulatory Visit: Payer: Medicare Other | Admitting: *Deleted

## 2020-12-03 ENCOUNTER — Other Ambulatory Visit: Payer: Self-pay

## 2020-12-03 DIAGNOSIS — Z79899 Other long term (current) drug therapy: Secondary | ICD-10-CM

## 2020-12-03 DIAGNOSIS — E785 Hyperlipidemia, unspecified: Secondary | ICD-10-CM

## 2020-12-03 LAB — LIPID PANEL
Chol/HDL Ratio: 4.4 ratio (ref 0.0–4.4)
Cholesterol, Total: 169 mg/dL (ref 100–199)
HDL: 38 mg/dL — ABNORMAL LOW (ref >39)
LDL Chol Calc (NIH): 109 mg/dL — ABNORMAL HIGH (ref 0–99)
Triglycerides: 121 mg/dL (ref 0–149)
VLDL Cholesterol Cal: 22 mg/dL (ref 5–40)

## 2020-12-05 ENCOUNTER — Emergency Department (HOSPITAL_COMMUNITY): Payer: Medicare Other

## 2020-12-05 ENCOUNTER — Emergency Department (HOSPITAL_COMMUNITY)
Admission: EM | Admit: 2020-12-05 | Discharge: 2020-12-06 | Disposition: A | Discharge status: Home / Self Care | Payer: Medicare Other | Attending: Emergency Medicine | Admitting: Emergency Medicine

## 2020-12-05 ENCOUNTER — Encounter (HOSPITAL_COMMUNITY): Payer: Self-pay | Admitting: *Deleted

## 2020-12-05 ENCOUNTER — Other Ambulatory Visit: Payer: Self-pay

## 2020-12-05 ENCOUNTER — Ambulatory Visit (HOSPITAL_COMMUNITY)
Admission: EM | Admit: 2020-12-05 | Discharge: 2020-12-05 | Disposition: A | Discharge status: Nursing Home (Includes ICF) | Payer: Medicare Other | Attending: Student | Admitting: Student

## 2020-12-05 DIAGNOSIS — Z7982 Long term (current) use of aspirin: Secondary | ICD-10-CM | POA: Diagnosis not present

## 2020-12-05 DIAGNOSIS — R0789 Other chest pain: Secondary | ICD-10-CM | POA: Diagnosis not present

## 2020-12-05 DIAGNOSIS — R079 Chest pain, unspecified: Secondary | ICD-10-CM

## 2020-12-05 DIAGNOSIS — I340 Nonrheumatic mitral (valve) insufficiency: Secondary | ICD-10-CM | POA: Diagnosis not present

## 2020-12-05 DIAGNOSIS — Z853 Personal history of malignant neoplasm of breast: Secondary | ICD-10-CM | POA: Diagnosis not present

## 2020-12-05 DIAGNOSIS — I10 Essential (primary) hypertension: Secondary | ICD-10-CM

## 2020-12-05 DIAGNOSIS — R42 Dizziness and giddiness: Secondary | ICD-10-CM | POA: Insufficient documentation

## 2020-12-05 LAB — COMPREHENSIVE METABOLIC PANEL
ALT: 17 U/L (ref 0–44)
AST: 21 U/L (ref 15–41)
Albumin: 3.8 g/dL (ref 3.5–5.0)
Alkaline Phosphatase: 57 U/L (ref 38–126)
Anion gap: 8 (ref 5–15)
BUN: 9 mg/dL (ref 8–23)
CO2: 24 mmol/L (ref 22–32)
Calcium: 8.7 mg/dL — ABNORMAL LOW (ref 8.9–10.3)
Chloride: 107 mmol/L (ref 98–111)
Creatinine, Ser: 0.71 mg/dL (ref 0.44–1.00)
GFR, Estimated: >60 mL/min (ref >60)
Glucose, Bld: 96 mg/dL (ref 70–99)
Potassium: 3.4 mmol/L — ABNORMAL LOW (ref 3.5–5.1)
Sodium: 139 mmol/L (ref 135–145)
Total Bilirubin: 1 mg/dL (ref 0.3–1.2)
Total Protein: 6.4 g/dL — ABNORMAL LOW (ref 6.5–8.1)

## 2020-12-05 LAB — TROPONIN I (HIGH SENSITIVITY)
Troponin I (High Sensitivity): 2 ng/L (ref <18)
Troponin I (High Sensitivity): 3 ng/L (ref <18)

## 2020-12-05 LAB — CBC
HCT: 41.6 % (ref 36.0–46.0)
Hemoglobin: 14.1 g/dL (ref 12.0–15.0)
MCH: 31.8 pg (ref 26.0–34.0)
MCHC: 33.9 g/dL (ref 30.0–36.0)
MCV: 93.9 fL (ref 80.0–100.0)
Platelets: 207 10*3/uL (ref 150–400)
RBC: 4.43 MIL/uL (ref 3.87–5.11)
RDW: 11.6 % (ref 11.5–15.5)
WBC: 4.4 10*3/uL (ref 4.0–10.5)
nRBC: 0 % (ref 0.0–0.2)

## 2020-12-05 LAB — LIPASE, BLOOD: Lipase: 41 U/L (ref 11–51)

## 2020-12-05 NOTE — ED Provider Notes (Signed)
Emergency Medicine Provider Triage Evaluation Note  Victoria Cardenas , a 70 y.o. female  was evaluated in triage.  Pt complains of chest pain that started this am. Denies sob, diaphoresis, nausea. Does report some lightheadedness or abd pain.  Review of Systems  Positive: Chest pain, lightheadedness Negative: Sob, diaphoresis, nausea, abd pain  Physical Exam  BP (!) 164/103 (BP Location: Left Arm)   Pulse 67   Temp 98.3 F (36.8 C)   Resp 15   SpO2 99%  Gen:   Awake, no distress   Resp:  Normal effort  MSK:   Moves extremities without difficulty  Other:  Lungs ctab, heart rrr, abd soft and nontender  Medical Decision Making  Medically screening exam initiated at 3:19 PM.  Appropriate orders placed.  Victoria Cardenas was informed that the remainder of the evaluation will be completed by another provider, this initial triage assessment does not replace that evaluation, and the importance of remaining in the ED until their evaluation is complete.     Victoria Cardenas 12/05/20 1520    Milton Ferguson, MD 12/09/20 [phone]

## 2020-12-05 NOTE — ED Triage Notes (Signed)
Chest pain since this am 1100 with some sob  supposed to take bp meds but stopped taking it

## 2020-12-05 NOTE — ED Triage Notes (Signed)
EKG shown to ED provider - provider w/ rapid eval in triage area.  Pt instructed she needs to have further eval in ED.  Patient is being discharged from the [facility] and sent to the Emergency Department. Per L. Phillip Heal, [place], patient is stable but in need of higher level of care due to chest pressure R/O cardiac source. Patient is aware and verbalizes understanding of plan of care.  Vitals:   12/05/20 1443  BP: (!) 178/101  Pulse: 83  Resp: 20  SpO2: 100%

## 2020-12-05 NOTE — Discharge Instructions (Addendum)
ED via personal vehicle driven by caregiver, declines EMS

## 2020-12-05 NOTE — ED Provider Notes (Signed)
[place]    CSN: [id number] Arrival date & time: 12/05/20  1418      History   Chief Complaint Chief Complaint  Patient presents with   Chest Pain    HPI Victoria Cardenas is a 70 y.o. female presenting with left-sided chest pressure for about 3 hours, states this feels like indigestion.  Some lightheadedness, and radiation of pain to left breast, but denies shortness of breath, nausea/vomiting/diarrhea, headaches, focal weakness. Taking antihypertensives as directed. History mitral regurg, NSVT, breast cancer, thyroid disease, hypertension.  HPI  Past Medical History:  Diagnosis Date   Breast cancer ([place]) 2005   right breast   Dizziness    Glaucoma    Hypercholesteremia    Hypertension    Medication intolerance    multiple   Mild mitral regurgitation    NSVT (nonsustained ventricular tachycardia) ([facility])    Paresthesia    Shortened PR interval    SVT (supraventricular tachycardia) ([place])    Thyroid disease     Patient Active Problem List   Diagnosis Date Noted   Graves disease 04/04/2019   Multinodular goiter 01/20/2019   Subclinical hyperthyroidism 01/20/2019   Hyperthyroidism 09/27/2018   Sore throat, chronic 09/27/2018   Heart palpitations 12/23/2016   Essential hypertension 12/23/2016   Hyperlipidemia 12/23/2016   Hyperglycemia 12/23/2016   Eustachian tube dysfunction, right 02/27/2016   Ear pressure, right 02/06/2016   Sensorineural hearing loss (SNHL), bilateral 02/06/2016    Past Surgical History:  Procedure Laterality Date   ABDOMINAL HYSTERECTOMY     BREAST EXCISIONAL BIOPSY Left 10/10/2015   benign   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      Home Medications    Prior to Admission medications   Medication Sig Start Date End Date Taking? Authorizing Provider  aspirin EC 81 MG tablet Take 81 mg by mouth every other day.    [provider]  cholecalciferol (VITAMIN D3) 25  MCG (1000 UT) tablet Take 1,000 Units by mouth daily.    [provider]  LUMIGAN 0.01 % SOLN Place 1 drop into both eyes at bedtime. 05/14/18   [provider]  methimazole (TAPAZOLE) 5 MG tablet Take 0.5 tablets (2.5 mg total) by mouth daily. 05/11/20   Shamleffer, Melanie Crazier, MD  potassium chloride 20 MEQ/15ML (10%) SOLN Take 15 mLs (20 mEq total) by mouth daily. 10/23/20   Freada Bergeron, MD    Family History Family History  Problem Relation Age of Onset   Hypertension Mother    Breast cancer Mother 2   Breast cancer Maternal Aunt    Breast cancer Maternal Grandmother     Social History Social History   Tobacco Use   Smoking status: Never   Smokeless tobacco: Never  Vaping Use   Vaping Use: Never used  Substance Use Topics   Alcohol use: No   Drug use: No     Allergies   Amlodipine, Penicillins, Cardizem cd [diltiazem], Lisinopril, Losartan, Propranolol, Rosuvastatin, Ciprofloxacin, and Simvastatin   Review of Systems Review of Systems  Cardiovascular:  Positive for chest pain.  Neurological:  Positive for light-headedness.  All other systems reviewed and are negative.   Physical Exam Triage Vital Signs ED Triage Vitals  Enc Vitals Group     BP 12/05/20 1443 (!) 178/101     Pulse Rate 12/05/20 1443 83     Resp 12/05/20 1443 20  Temp --      Temp src --      SpO2 12/05/20 1443 100 %     Weight --      Height --      Head Circumference --      Peak Flow --      Pain Score 12/05/20 1454 5     Pain Loc --      Pain Edu? --      Excl. in [place]? --    No data found.  Updated Vital Signs BP (!) 178/101   Pulse 83   Resp 20   SpO2 100%   Visual Acuity Right Eye Distance:   Left Eye Distance:   Bilateral Distance:    Right Eye Near:   Left Eye Near:    Bilateral Near:     Physical Exam Vitals reviewed.  Constitutional:      Appearance: Normal appearance. She is not diaphoretic.  HENT:     Head: Normocephalic and  atraumatic.     Mouth/Throat:     Mouth: Mucous membranes are moist.  Eyes:     Extraocular Movements: Extraocular movements intact.     Pupils: Pupils are equal, round, and reactive to light.  Cardiovascular:     Rate and Rhythm: Normal rate and regular rhythm.     Pulses:          Radial pulses are 2+ on the right side and 2+ on the left side.     Heart sounds: Normal heart sounds.  Pulmonary:     Effort: Pulmonary effort is normal.     Breath sounds: Normal breath sounds.  Chest:     Chest wall: No tenderness.  Abdominal:     Palpations: Abdomen is soft.     Tenderness: There is no abdominal tenderness. There is no guarding or rebound.  Musculoskeletal:     Right lower leg: No edema.     Left lower leg: No edema.  Skin:    General: Skin is warm.     Capillary Refill: Capillary refill takes less than 2 seconds.  Neurological:     General: No focal deficit present.     Mental Status: She is alert and oriented to person, place, and time.  Psychiatric:        Mood and Affect: Mood normal.        Behavior: Behavior normal.        Thought Content: Thought content normal.        Judgment: Judgment normal.     UC Treatments / Results  Labs (all labs ordered are listed, but only abnormal results are displayed) Labs Reviewed - No data to display  EKG   Radiology No results found.  Procedures Procedures (including critical care time)  Medications Ordered in UC Medications - No data to display  Initial Impression / Assessment and Plan / UC Course  I have reviewed the triage vital signs and the nursing notes.  Pertinent labs & imaging results that were available during my care of the patient were reviewed by me and considered in my medical decision making (see chart for details).     This patient is a very pleasant 70 y.o. year old female presenting with chest pain x3 hours. EKG stable since 07/2020. Given cardiac history I am recommending this patient head to ED for  further cardiac workup/MI ruleout. She declines EMS in favor of personal vehicle driven by caregiver.   Final Clinical Impressions(s) / UC Diagnoses  Final diagnoses:  Chest pain, unspecified type  Mild mitral regurgitation  Essential hypertension     Discharge Instructions      ED via personal vehicle driven by caregiver, declines EMS   ED Prescriptions   None    PDMP not reviewed this encounter.   Hazel Sams, PA-C 12/05/20 1502

## 2020-12-05 NOTE — ED Triage Notes (Signed)
C/O constant substernal chest pressure and feeling of "indigestion" onset @ 1130 today.  States pressure non-reproduceable. C/O lightheadedness, but denies SOB, n/v.

## 2020-12-06 ENCOUNTER — Other Ambulatory Visit: Payer: Self-pay | Admitting: Internal Medicine

## 2020-12-06 ENCOUNTER — Other Ambulatory Visit: Payer: Medicare Other

## 2020-12-06 DIAGNOSIS — R0789 Other chest pain: Secondary | ICD-10-CM | POA: Diagnosis not present

## 2020-12-06 DIAGNOSIS — M810 Age-related osteoporosis without current pathological fracture: Secondary | ICD-10-CM

## 2020-12-06 MED ORDER — ALUM & MAG HYDROXIDE-SIMETH 200-200-20 MG/5ML PO SUSP
30.0000 mL | Freq: Once | ORAL | Status: AC
  Administered 2020-12-06: 30 mL via ORAL
  Filled 2020-12-06: qty 30

## 2020-12-06 NOTE — ED Provider Notes (Signed)
[facility] EMERGENCY DEPARTMENT Provider Note   CSN: [id number] Arrival date & time: 12/05/20  1505     History Chief Complaint  Patient presents with   Chest Pain    Victoria Cardenas is a 70 y.o. female.  HPI Patient is a 70 year old female with a history of hypertension, hyperlipidemia, and SVT, mitral regurgitation, Graves' disease, who presents to the emergency department due to chest tightness that began around 11:30 AM yesterday morning.  Patient states she initially began experiencing mild upper abdominal pain.  Her symptoms resolved and then she began experiencing central chest tightness.  No modifying factors.  Reports associated lightheadedness when ambulating.  Pain is nonradiating.  No shortness of breath, diaphoresis, nausea, vomiting.  She states that she drank vinegar and water without relief.  Denies a significant history of GERD.  She does note that burping seem to help alleviate her symptoms.  States her tightness was about a 9/10 yesterday and is currently about an 8/10.  Denies a history of smoking.  Denies a known cardiac history with any first-degree relatives.  No leg swelling or hemoptysis.  Patient states that she is followed by cardiology and has taken antihypertensive in the past but has been unable to tolerate any medications due to their side effects so currently is managing her blood pressure with diet.     Past Medical History:  Diagnosis Date   Breast cancer ([place]) 2005   right breast   Dizziness    Glaucoma    Hypercholesteremia    Hypertension    Medication intolerance    multiple   Mild mitral regurgitation    NSVT (nonsustained ventricular tachycardia) (HCC)    Paresthesia    Shortened PR interval    SVT (supraventricular tachycardia) ([place])    Thyroid disease     Patient Active Problem List   Diagnosis Date Noted   Graves disease 04/04/2019   Multinodular goiter 01/20/2019   Subclinical hyperthyroidism 01/20/2019    Hyperthyroidism 09/27/2018   Sore throat, chronic 09/27/2018   Heart palpitations 12/23/2016   Essential hypertension 12/23/2016   Hyperlipidemia 12/23/2016   Hyperglycemia 12/23/2016   Eustachian tube dysfunction, right 02/27/2016   Ear pressure, right 02/06/2016   Sensorineural hearing loss (SNHL), bilateral 02/06/2016    Past Surgical History:  Procedure Laterality Date   ABDOMINAL HYSTERECTOMY     BREAST EXCISIONAL BIOPSY Left 10/10/2015   benign   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     Family History  Problem Relation Age of Onset   Hypertension Mother    Breast cancer Mother 68   Breast cancer Maternal Aunt    Breast cancer Maternal Grandmother     Social History   Tobacco Use   Smoking status: Never   Smokeless tobacco: Never  Vaping Use   Vaping Use: Never used  Substance Use Topics   Alcohol use: No   Drug use: No    Home Medications Prior to Admission medications   Medication Sig Start Date End Date Taking? Authorizing Provider  aspirin EC 81 MG tablet Take 81 mg by mouth every other day.    [provider]  cholecalciferol (VITAMIN D3) 25 MCG (1000 UT) tablet Take 1,000 Units by mouth daily.    [provider]  LUMIGAN 0.01 % SOLN Place 1 drop into both eyes at bedtime. 05/14/18   [provider]  methimazole (TAPAZOLE) 5 MG  tablet Take 0.5 tablets (2.5 mg total) by mouth daily. 05/11/20   Shamleffer, Melanie Crazier, MD  potassium chloride 20 MEQ/15ML (10%) SOLN Take 15 mLs (20 mEq total) by mouth daily. 10/23/20   Freada Bergeron, MD    Allergies    Amlodipine, Penicillins, Cardizem cd [diltiazem], Lisinopril, Losartan, Propranolol, Rosuvastatin, Ciprofloxacin, and Simvastatin  Review of Systems   Review of Systems  All other systems reviewed and are negative. Ten systems reviewed and are negative for acute change, except as noted in the HPI.   Physical Exam Updated  Vital Signs BP (!) 154/77   Pulse (!) 58   Temp 97.8 F (36.6 C)   Resp 18   Ht '5\' 3"'$  (1.6 m)   Wt 52.6 kg   SpO2 100%   BMI 20.54 kg/m   Physical Exam Vitals and nursing note reviewed.  Constitutional:      General: She is not in acute distress.    Appearance: Normal appearance. She is well-developed and normal weight. She is not ill-appearing, toxic-appearing or diaphoretic.  HENT:     Head: Normocephalic and atraumatic.     Right Ear: External ear normal.     Left Ear: External ear normal.     Nose: Nose normal.     Mouth/Throat:     Mouth: Mucous membranes are moist.     Pharynx: Oropharynx is clear. No oropharyngeal exudate or posterior oropharyngeal erythema.  Eyes:     Extraocular Movements: Extraocular movements intact.     Pupils: Pupils are equal, round, and reactive to light.  Cardiovascular:     Rate and Rhythm: Normal rate and regular rhythm.     Pulses: Normal pulses.          Radial pulses are 2+ on the right side and 2+ on the left side.       Dorsalis pedis pulses are 2+ on the right side and 2+ on the left side.     Heart sounds: Normal heart sounds. Heart sounds not distant. No murmur heard. No systolic murmur is present.  No diastolic murmur is present.    No friction rub. No gallop. No S3 or S4 sounds.     Comments: Regular rate and rhythm without murmurs, rubs, or gallops. Pulmonary:     Effort: Pulmonary effort is normal. No tachypnea, accessory muscle usage or respiratory distress.     Breath sounds: Normal breath sounds. No stridor. No decreased breath sounds, wheezing, rhonchi or rales.     Comments: Lungs are clear to auscultation bilaterally. Chest:     Chest wall: No tenderness.     Comments: No tenderness appreciated along the anterior chest wall. Abdominal:     General: Abdomen is flat.     Tenderness: There is no abdominal tenderness.  Musculoskeletal:        General: Normal range of motion.     Cervical back: Normal range of motion  and neck supple. No tenderness.     Right lower leg: No tenderness. No edema.     Left lower leg: No tenderness. No edema.     Comments: No pedal edema noted.  2+ DP pulses.  Skin:    General: Skin is warm and dry.  Neurological:     General: No focal deficit present.     Mental Status: She is alert and oriented to person, place, and time.  Psychiatric:        Mood and Affect: Mood normal.  Behavior: Behavior normal.   ED Results / Procedures / Treatments   Labs (all labs ordered are listed, but only abnormal results are displayed) Labs Reviewed  COMPREHENSIVE METABOLIC PANEL - Abnormal; Notable for the following components:      Result Value   Potassium 3.4 (*)    Calcium 8.7 (*)    Total Protein 6.4 (*)    All other components within normal limits  CBC  LIPASE, BLOOD  TROPONIN I (HIGH SENSITIVITY)  TROPONIN I (HIGH SENSITIVITY)   EKG EKG Interpretation  Date/Time:  Wednesday December 05 2020 15:20:08 EDT Ventricular Rate:  63 PR Interval:  108 QRS Duration: 74 QT Interval:  402 QTC Calculation: 411 R Axis:   37 Text Interpretation: Sinus rhythm with short PR Right atrial enlargement Borderline ECG When compared with ECG of EARLIER SAME DATE No significant change was found Confirmed by Delora Fuel ([id number]) on 12/05/2020 11:14:22 PM  Radiology DG Chest 1 View  Result Date: 12/05/2020 CLINICAL DATA:  Chest pain. EXAM: CHEST  1 VIEW COMPARISON:  March 06, 2020. FINDINGS: The heart size and mediastinal contours are within normal limits. Both lungs are clear. The visualized skeletal structures are unremarkable. IMPRESSION: No active disease. Electronically Signed   By: Marijo Conception M.D.   On: 12/05/2020 17:08    Procedures Procedures   Medications Ordered in ED Medications  alum & mag hydroxide-simeth (MAALOX/MYLANTA) 200-200-20 MG/5ML suspension 30 mL (30 mLs Oral Given 12/06/20 0715)    ED Course  I have reviewed the triage vital signs and the nursing  notes.  Pertinent labs & imaging results that were available during my care of the patient were reviewed by me and considered in my medical decision making (see chart for details).    MDM Rules/Calculators/A&P                          Pt is a 70 y.o. female who presents to the emergency department due to chest tightness as well as lightheadedness.  Labs: CBC without abnormalities. CMP with a potassium of 3.4, calcium of 8.7, total protein of 6.4. Lipase of 41. Troponin of 2 with a repeat of 3.  Imaging: Chest x-ray is negative.  ECG: Sinus rhythm with a short PR.  Right atrial enlargement.  Borderline ECG which shows no significant change since last tracing.  I, Rayna Sexton, PA-C, personally reviewed and evaluated these images and lab results as part of my medical decision-making.  Heart score 4.  Reassuring chest x-ray, troponins, as well as ECG.  Doubt ACS at this time.  Patient is not having any reproducible pain in the chest or abdomen at this time.  Given a GI cocktail and notes significant relief of her symptoms.  Patient discussed with and evaluated by my attending physician Dr. Fredia Sorrow.  We feel that patient is stable for discharge at this time and she is agreeable.  Recommended follow-up with cardiology.  Discussed return precautions.  Her questions were answered and she was amicable at the time of discharge.  Note: Portions of this report may have been transcribed using voice recognition software. Every effort was made to ensure accuracy; however, inadvertent computerized transcription errors may be present.   Final Clinical Impression(s) / ED Diagnoses Final diagnoses:  Chest tightness   Rx / DC Orders ED Discharge Orders     None        Rayna Sexton, PA-C 12/06/20 0758    Fredia Sorrow,  MD 12/07/20 1554

## 2020-12-06 NOTE — Discharge Instructions (Signed)
Like we discussed, please make sure you follow-up with your cardiologist regarding your symptoms today as well as this visit to the emergency department.  If you develop any new or worsening symptoms please come back to the emergency department immediately.  It was a pleasure to meet you.

## 2020-12-06 NOTE — ED Notes (Signed)
Patient discharge instructions and follow up care reviewed with the patient. The patient verbalized understanding of both. Patient discharged.

## 2020-12-06 NOTE — ED Provider Notes (Signed)
I provided a substantive portion of the care of this patient.  I personally performed the entirety of the history, exam, and medical decision making for this encounter.  EKG Interpretation  Date/Time:  Wednesday December 05 2020 15:20:08 EDT Ventricular Rate:  63 PR Interval:  108 QRS Duration: 74 QT Interval:  402 QTC Calculation: 411 R Axis:   37 Text Interpretation: Sinus rhythm with short PR Right atrial enlargement Borderline ECG When compared with ECG of EARLIER SAME DATE No significant change was found Confirmed by Delora Fuel ([id number]) on 12/05/2020 11:14:22 PM  Patient seen by me along with the physician assistant.  Patient yesterday with anterior substernal chest pain patient thought may be indigestion.  But it kept recurring.  She did take a baby aspirin.  She has not had anything like this before.  She is followed by cardiology mostly for SVT and hypertension.  Last seen by them on June 22.  Patient given a GI cocktail pain is resolved.  Patient's troponins were very normal 2 and 3.  Labs without any significant abnormalities.  Chest x-ray without any acute findings.  An EKG without any acute changes.  Patient's chest nontender lungs are clear abdomen soft lower extremities without any edema.  Feel patient is can be discharged home with earlier follow-up with cardiology and precautions on when to return.   Fredia Sorrow, MD 12/06/20 [phone]

## 2020-12-06 NOTE — ED Notes (Signed)
I triaged this pt earlier today she stopped taking her bp med for months  Now feels lightheaded  No pain now

## 2020-12-10 ENCOUNTER — Other Ambulatory Visit: Payer: Medicare Other

## 2020-12-19 ENCOUNTER — Ambulatory Visit
Admission: RE | Admit: 2020-12-19 | Discharge: 2020-12-19 | Disposition: A | Discharge status: Home / Self Care | Payer: Medicare Other | Source: Ambulatory Visit | Attending: Internal Medicine | Admitting: Internal Medicine

## 2020-12-19 DIAGNOSIS — E042 Nontoxic multinodular goiter: Secondary | ICD-10-CM

## 2020-12-21 ENCOUNTER — Encounter: Payer: Self-pay | Admitting: Internal Medicine

## 2020-12-25 ENCOUNTER — Other Ambulatory Visit: Payer: Self-pay

## 2020-12-25 MED ORDER — POTASSIUM CHLORIDE 20 MEQ/15ML (10%) PO SOLN: 20.0000 meq | Freq: Every day | ORAL | 9 refills | Status: DC

## 2020-12-31 NOTE — Progress Notes (Addendum)
Office Visit    Patient Name: Victoria Cardenas Date of Encounter: 01/01/2021  PCP:  Cipriano Mile, NP   [place]  Cardiologist:  Freada Bergeron, MD  Advanced Practice Provider:  No care team member to display Electrophysiologist:  None      Chief Complaint    Victoria Cardenas is a 70 y.o. female with a hx of SVT, palpitations, HTN, Graves disease, HLD presents today for follow up after ED visit for chest pain   Past Medical History    Past Medical History:  Diagnosis Date   Breast cancer ([place]) 2005   right breast   Dizziness    Glaucoma    Hypercholesteremia    Hypertension    Medication intolerance    multiple   Mild mitral regurgitation    NSVT (nonsustained ventricular tachycardia) (HCC)    Paresthesia    Shortened PR interval    SVT (supraventricular tachycardia) ([facility])    Thyroid disease    Past Surgical History:  Procedure Laterality Date   ABDOMINAL HYSTERECTOMY     BREAST EXCISIONAL BIOPSY Left 10/10/2015   benign   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    Allergies  Allergies  Allergen Reactions   Amlodipine Swelling and Palpitations    Swelling, heart pounding    Penicillins Hives    Has patient had a PCN reaction causing immediate rash, facial/tongue/throat swelling, SOB or lightheadedness with hypotension: yes Has patient had a PCN reaction causing severe rash involving mucus membranes or skin necrosis: no Has patient had a PCN reaction that required hospitalization: no Has patient had a PCN reaction occurring within the last 10 years: no If all of the above answers are "NO", then may proceed with Cephalosporin    Cardizem Cd [Diltiazem] Other (See Comments)    Pt reports this med causes her fatigue, dizziness, lightheaded, sore throat, and hard swallowing meals.   Lisinopril     Patient refused prior rx due to potential side effects outlined in medication information   Losartan Other (See Comments)    Pt  reports causes frequent urination and UTIs   Propranolol Other (See Comments)    Pt reports causes flushing    Rosuvastatin Other (See Comments)    Pt reports caused her to feel nauseated, dizzy, and lightheaded.    Ciprofloxacin Palpitations   Simvastatin Other (See Comments)    Increased urination    History of Present Illness    Victoria Cardenas is a 70 y.o. female with a hx of SVT, palpitations, HTN, Graves disease, HLD last seen 10/22/20 by Dr. Johney Frame.  Previously followed with Dr. [name] and has transitioned care to Dr. Johney Frame. Her blood pressure has been monitored with multiple medication intolerances including Losartan, Lisinopril, Amlodipine, Diltiazem, Propranolol. At most recent visit with Dr. Johney Frame BP was reasonably well controlled and no antihypertensive agent recommended. Shew as started on Zetia for lipid lowering. She has a statin intolerance.   ED visit 12/05/20 with chest pain. Pain resolved with GI cocktail. HS troponin 2 and 3. CXR and EKG without acute changes.   She presents today for follow up.  When asked about her episode the prompted her to present to the ED tells me she had indigestion that day. She tried juice and vinegar without relief.  When it started to radiate to her chest and she experienced shortness of breath that is when she sought emergency treatment.Tells me her younger sister had similar symptoms and  ended up having a heart attack so she wanted to be certain.  She has had no recurrent chest pain, pressure, tightness.  Monitors her blood pressure at home and readings are overall labile.  EKGs/Labs/Other Studies Reviewed:   The following studies were reviewed today:  ECHO IMPRESSIONS 09/2018    1. The left ventricle has normal systolic function with an ejection  fraction of 60-65%. The cavity size was normal. Left ventricular diastolic  parameters were normal. No evidence of left ventricular regional wall  motion abnormalities.   2. The  right ventricle has normal systolic function. The cavity was  normal. There is no increase in right ventricular wall thickness. Right  ventricular systolic pressure could not be assessed.   3. Mild thickening of the mitral valve leaflet. The MR jet is  centrally-directed.   4. Mild thickening of the aortic valve. No stenosis of the aortic valve.   Cardiac Monitor 09/09/18:   Sinus tach cardia to sinus tachycardia. 1 episode of ventricular tachycardia for total of 9 beats with ventricular rate 170 bpm. 43 episodes of SVTs some of which seem to represent atrial fibrillation.   Sinus tach cardia to sinus tachycardia. 1 episode of ventricular tachycardia for total of 9 beats with ventricular rate 170 bpm. 43 episodes of SVTs some of which seem to represent atrial fibrillation.  EKG:  No EKG is  ordered today.  The ekg independently from 12/05/20 demonstrated NSR with no acute ST/T wave changes.   Recent Labs: 08/04/2020: Magnesium 1.9 11/22/2020: TSH 2.31 12/05/2020: ALT 17; BUN 9; Creatinine, Ser 0.71; Hemoglobin 14.1; Platelets 207; Potassium 3.4; Sodium 139  Recent Lipid Panel    Component Value Date/Time   CHOL 169 12/03/2020 0851   TRIG 121 12/03/2020 0851   HDL 38 (L) 12/03/2020 0851   CHOLHDL 4.4 12/03/2020 0851   LDLCALC 109 (H) 12/03/2020 0851    Home Medications   Current Meds  Medication Sig   aspirin EC 81 MG tablet Take 81 mg by mouth every other day.   cholecalciferol (VITAMIN D3) 25 MCG (1000 UT) tablet Take 1,000 Units by mouth daily.   LUMIGAN 0.01 % SOLN Place 1 drop into both eyes at bedtime.   methimazole (TAPAZOLE) 5 MG tablet Take 0.5 tablets (2.5 mg total) by mouth daily.   potassium chloride 20 MEQ/15ML (10%) SOLN Take 15 mLs (20 mEq total) by mouth daily.     Review of Systems      All other systems reviewed and are otherwise negative except as noted above.  Physical Exam    VS:  BP (!) 148/82   Pulse 64   Ht '5\' 3"'$  (1.6 m)   Wt 117 lb 12.8 oz (53.4  kg)   SpO2 94%   BMI 20.87 kg/m  , BMI Body mass index is 20.87 kg/m.  Wt Readings from Last 3 Encounters:  01/01/21 117 lb 12.8 oz (53.4 kg)  12/05/20 115 lb 15.4 oz (52.6 kg)  11/14/20 116 lb (52.6 kg)    GEN: Well nourished, well developed, in no acute distress. HEENT: normal. Neck: Supple, no JVD, carotid bruits, or masses. Cardiac: RRR, no murmurs, rubs, or gallops. No clubbing, cyanosis, edema.  Radials/PT 2+ and equal bilaterally.  Respiratory:  Respirations regular and unlabored, clear to auscultation bilaterally. GI: Soft, nontender, nondistended. MS: No deformity or atrophy. Skin: Warm and dry, no rash. Neuro:  Strength and sensation are intact. Psych: Normal affect.  Assessment & Plan    Chest pain - Previous  ED visit 12/05/20 with atypical chest pain resolved with GI cocktail. Family history notable for coronary disease.  Additional risk factors include hypertension hyperlipidemia.  10 year ASCVD risk score intermediate at 13.1%. Given risk factors, plan for cardiac CTA.  She will take metoprolol titrate 50 mg 2 hours prior to the study.  If coronary artery disease noted will require addition of aspirin and lipid management, as below.  HTN - Multiple medication intolerances (Lisinopril, Amlodipine, Cardizem, Losartan, Propranolol). BP is labile on home monitoring. 109/63 - 140s/60s on home monitoring.  Will not add additional antihypertensive agent at this time given some low blood pressure readings at home  Graves disease - Continue to follow with PCP. Continue Tapazole at current dose.   SVT - Echo 2020 LVEF 60-65% no significant valvular abnormalities.  No recurrence.  HLD - Intolerant to statin (Simvastatin, Rosuvastatin) as well as Zetia.  Plan for cardiac CTA, as above.  If coronary artery disease noted will likely require referral to lipid clinic for management of hyperlipidemia.  Disposition: Follow up in January as scheduled  Signed, Loel Dubonnet,  NP 01/01/2021, 2:50 PM [place]

## 2021-01-01 ENCOUNTER — Ambulatory Visit (INDEPENDENT_AMBULATORY_CARE_PROVIDER_SITE_OTHER): Payer: Medicare Other | Admitting: Family

## 2021-01-01 ENCOUNTER — Encounter (HOSPITAL_BASED_OUTPATIENT_CLINIC_OR_DEPARTMENT_OTHER): Payer: Self-pay | Admitting: Family

## 2021-01-01 ENCOUNTER — Other Ambulatory Visit: Payer: Self-pay

## 2021-01-01 VITALS — BP 148/82 | HR 64 | Ht 63.0 in | Wt 117.8 lb

## 2021-01-01 DIAGNOSIS — E782 Mixed hyperlipidemia: Secondary | ICD-10-CM | POA: Diagnosis not present

## 2021-01-01 DIAGNOSIS — I10 Essential (primary) hypertension: Secondary | ICD-10-CM | POA: Diagnosis not present

## 2021-01-01 DIAGNOSIS — Z9189 Other specified personal risk factors, not elsewhere classified: Secondary | ICD-10-CM

## 2021-01-01 DIAGNOSIS — Z8249 Family history of ischemic heart disease and other diseases of the circulatory system: Secondary | ICD-10-CM

## 2021-01-01 DIAGNOSIS — E0500 Thyrotoxicosis with diffuse goiter without thyrotoxic crisis or storm: Secondary | ICD-10-CM

## 2021-01-01 DIAGNOSIS — R072 Precordial pain: Secondary | ICD-10-CM

## 2021-01-01 DIAGNOSIS — R079 Chest pain, unspecified: Secondary | ICD-10-CM | POA: Diagnosis not present

## 2021-01-01 MED ORDER — METOPROLOL TARTRATE 50 MG PO TABS: ORAL_TABLET | ORAL | 0 refills | Status: DC

## 2021-01-01 NOTE — Patient Instructions (Addendum)
Medication Instructions:  Continue your current medications.   We have referred you to our pharmacy team to discuss cholesterol medications.  *If you need a refill on your cardiac medications before your next appointment, please call your pharmacy*  Lab Work: None ordered today.   Testing/Procedures: Your EKG in the emergency department looked great!  Follow-Up: At [facility], you and your health needs are our priority.  As part of our continuing mission to provide you with exceptional heart care, we have created designated Provider Care Teams.  These Care Teams include your primary Cardiologist (physician) and Advanced Practice Providers (APPs -  Physician Assistants and Nurse Practitioners) who all work together to provide you with the care you need, when you need it.  We recommend signing up for the patient portal called "MyChart".  Sign up information is provided on this After Visit Summary.  MyChart is used to connect with patients for Virtual Visits (Telemedicine).  Patients are able to view lab/test results, encounter notes, upcoming appointments, etc.  Non-urgent messages can be sent to your provider as well.   To learn more about what you can do with MyChart, go to [email or web address].    Your next appointment:   As scheduled in January   Other Instructions    Your cardiac CT will be scheduled at one of the below locations:   [facility] [address] [place], [place] [address] [phone]   If scheduled at [facility], please arrive at the [facility] main entrance (entrance A) of [facility] 30 minutes prior to test start time. Proceed to the [facility] Radiology Department (first floor) to check-in and test prep.  Please follow these instructions carefully (unless otherwise directed):  On the Night Before the Test: Be sure to Drink plenty of water. Do not consume any caffeinated/decaffeinated beverages or chocolate 12 hours  prior to your test. Do not take any antihistamines 12 hours prior to your test.  On the Day of the Test: Drink plenty of water until 1 hour prior to the test. Do not eat any food 4 hours prior to the test. You may take your regular medications prior to the test.  Take metoprolol (Lopressor) two hours prior to test. HOLD Furosemide/Hydrochlorothiazide morning of the test. FEMALES- please wear underwire-free bra if available, avoid dresses & tight clothing      After the Test: Drink plenty of water. After receiving IV contrast, you may experience a mild flushed feeling. This is normal. On occasion, you may experience a mild rash up to 24 hours after the test. This is not dangerous. If this occurs, you can take Benadryl 25 mg and increase your fluid intake. If you experience trouble breathing, this can be serious. If it is severe call 911 IMMEDIATELY. If it is mild, please call our office. If you take any of these medications: Glipizide/Metformin, Avandament, Glucavance, please do not take 48 hours after completing test unless otherwise instructed.  Please allow 2-4 weeks for scheduling of routine cardiac CTs. Some insurance companies require a pre-authorization which may delay scheduling of this test.   For non-scheduling related questions, please contact the cardiac imaging nurse navigator should you have any questions/concerns: Marchia Bond, Cardiac Imaging Nurse Navigator Gordy Clement, Cardiac Imaging Nurse Navigator [facility] Direct Office Dial: [phone]   For scheduling needs, including cancellations and rescheduling, please call [place], [phone].

## 2021-01-02 ENCOUNTER — Other Ambulatory Visit (HOSPITAL_COMMUNITY): Payer: Self-pay | Admitting: *Deleted

## 2021-01-02 DIAGNOSIS — Z01812 Encounter for preprocedural laboratory examination: Secondary | ICD-10-CM

## 2021-01-04 ENCOUNTER — Telehealth (HOSPITAL_COMMUNITY): Payer: Self-pay | Admitting: *Deleted

## 2021-01-04 NOTE — Telephone Encounter (Signed)
Attempted to call patient regarding upcoming cardiac CT appointment and to ask patient to get blood work prior to CT appointment. Left message on voicemail with name and callback number  Gordy Clement RN Navigator Cardiac [facility] [phone] Office [phone] Cell

## 2021-01-07 ENCOUNTER — Telehealth (HOSPITAL_COMMUNITY): Payer: Self-pay | Admitting: *Deleted

## 2021-01-07 ENCOUNTER — Other Ambulatory Visit: Payer: Medicare Other | Admitting: *Deleted

## 2021-01-07 ENCOUNTER — Other Ambulatory Visit: Payer: Self-pay

## 2021-01-07 NOTE — Telephone Encounter (Signed)
Reaching out to patient to offer assistance regarding upcoming cardiac imaging study; pt verbalizes understanding of appt date/time, parking situation and where to check in, pre-test NPO status and medications ordered, and verified current allergies; name and call back number provided for further questions should they arise  Gordy Clement RN Navigator Cardiac Imaging [facility] [phone] office [phone] cell  Patient to take 50mg  metoprolol tartrate two hours prior to cardiac CT scan.

## 2021-01-08 LAB — BASIC METABOLIC PANEL
BUN/Creatinine Ratio: 12 (ref 12–28)
BUN: 10 mg/dL (ref 8–27)
CO2: 25 mmol/L (ref 20–29)
Calcium: 9 mg/dL (ref 8.7–10.3)
Chloride: 104 mmol/L (ref 96–106)
Creatinine, Ser: 0.81 mg/dL (ref 0.57–1.00)
Glucose: 107 mg/dL — ABNORMAL HIGH (ref 65–99)
Potassium: 3.9 mmol/L (ref 3.5–5.2)
Sodium: 142 mmol/L (ref 134–144)
eGFR: 78 mL/min/{1.73_m2} (ref >59)

## 2021-01-09 ENCOUNTER — Other Ambulatory Visit: Payer: Self-pay

## 2021-01-09 ENCOUNTER — Encounter (HOSPITAL_COMMUNITY): Payer: Self-pay

## 2021-01-09 ENCOUNTER — Ambulatory Visit (HOSPITAL_COMMUNITY)
Admission: RE | Admit: 2021-01-09 | Discharge: 2021-01-09 | Disposition: A | Discharge status: Home / Self Care | Payer: Medicare Other | Source: Ambulatory Visit | Attending: Family | Admitting: Family

## 2021-01-09 DIAGNOSIS — Z9189 Other specified personal risk factors, not elsewhere classified: Secondary | ICD-10-CM | POA: Insufficient documentation

## 2021-01-09 DIAGNOSIS — R079 Chest pain, unspecified: Secondary | ICD-10-CM | POA: Insufficient documentation

## 2021-01-09 DIAGNOSIS — R072 Precordial pain: Secondary | ICD-10-CM | POA: Insufficient documentation

## 2021-01-09 DIAGNOSIS — E782 Mixed hyperlipidemia: Secondary | ICD-10-CM | POA: Diagnosis not present

## 2021-01-09 DIAGNOSIS — Z136 Encounter for screening for cardiovascular disorders: Secondary | ICD-10-CM | POA: Insufficient documentation

## 2021-01-09 DIAGNOSIS — Z8249 Family history of ischemic heart disease and other diseases of the circulatory system: Secondary | ICD-10-CM | POA: Insufficient documentation

## 2021-01-09 MED ORDER — NITROGLYCERIN 0.4 MG SL SUBL
0.8000 mg | SUBLINGUAL_TABLET | Freq: Once | SUBLINGUAL | Status: AC
  Administered 2021-01-09: 0.8 mg via SUBLINGUAL

## 2021-01-09 MED ORDER — METOPROLOL TARTRATE 5 MG/5ML IV SOLN
INTRAVENOUS | Status: AC
  Administered 2021-01-09: 2.5 mg via INTRAVENOUS
  Filled 2021-01-09: qty 5

## 2021-01-09 MED ORDER — METOPROLOL TARTRATE 5 MG/5ML IV SOLN: 10.0000 mg | INTRAVENOUS | Status: DC | PRN

## 2021-01-09 MED ORDER — NITROGLYCERIN 0.4 MG SL SUBL
SUBLINGUAL_TABLET | SUBLINGUAL | Status: AC
  Filled 2021-01-09: qty 2

## 2021-01-09 MED ORDER — IOHEXOL 350 MG/ML SOLN
90.0000 mL | Freq: Once | INTRAVENOUS | Status: AC | PRN
  Administered 2021-01-09: 90 mL via INTRAVENOUS

## 2021-01-21 NOTE — Progress Notes (Signed)
Patient ID: Victoria Cardenas                 DOB: Dec 11, 1950                    MRN: [id number]     HPI: Victoria Cardenas is a 70 y.o. female patient of Dr. Johney Frame referred to lipid clinic by Laurann Montana, NP. PMH is significant for SVT, palpitations, HTN, HLD, Graves disease. ED visit in August 2022 for atypical chest pain that resolved with GI cocktail, EKG no acute changes. Cardiac CT on 01/09/21 showed calcium score of 0 and no significant coronary disease noted. Started on ezetimibe when last seen by Dr. Johney Frame in June but noted to be intolerant at visit with [facility]. Her 1- year ASCVD risk score using the pooled cohort equation is 13.1%.   Today, patient arrives in good spirits. Reports she has had side effects with multiple medications for cholesterol including simvastatin (increased urination), rosuvastatin (nausea, dizziness, lightheadedness), and most recently ezetimibe (palpitations, diarrhea, lightheadedness). She is not interested in trying medications at this time for her cholesterol and would rather continue to focus on her diet and exercise, both of which she maintains healthy habits.   Current Medications: none Intolerances: simvastatin 10 and 20 mg (increased urination), rosuvastatin 5 mg (nausea, dizzy, lightheaded), ezetimibe (palpitations, diarrhea, lightheaded)  Risk Factors: HTN, HLD, FHx ASCVD LDL goal: <100 mg/dL  Diet: stays away from salt, cooks a lot at home, no fried foods, egg once per week, rarely eats out but if she does gets grilled chicken, rare red meat - mostly [place] and chicken, TV dinner once a month, tries to stay away from processed foods, reads nutrition labels to buy low cholesterol foods  Exercise: uses public transportation, walks a lot to the store, has a treadmill at home that she uses on days she doesn't leave the house, lives on the 3rd floor and takes her dog out multiple times per day  Family History: Hypertension in her mother, younger  sister had MI (age 49)  Social History: Never smoker  Labs: 12/03/20: TC 169, TG 121, HDL 38, LDL 109 (no meds) 04/14/19: TC 200, TG 133, HDL 36, LDL 140 (no meds)  Past Medical History:  Diagnosis Date   Breast cancer ([place]) 2005   right breast   Dizziness    Glaucoma    Hypercholesteremia    Hypertension    Medication intolerance    multiple   Mild mitral regurgitation    NSVT (nonsustained ventricular tachycardia) (HCC)    Paresthesia    Shortened PR interval    SVT (supraventricular tachycardia) (HCC)    Thyroid disease     Current Outpatient Medications on File Prior to Visit  Medication Sig Dispense Refill   aspirin EC 81 MG tablet Take 81 mg by mouth every other day.     cholecalciferol (VITAMIN D3) 25 MCG (1000 UT) tablet Take 1,000 Units by mouth daily.     LUMIGAN 0.01 % SOLN Place 1 drop into both eyes at bedtime.     methimazole (TAPAZOLE) 5 MG tablet Take 0.5 tablets (2.5 mg total) by mouth daily. 45 tablet 3   metoprolol tartrate (LOPRESSOR) 50 MG tablet Take one tablet two hours prior to cardiac CTA. 1 tablet 0   potassium chloride 20 MEQ/15ML (10%) SOLN Take 15 mLs (20 mEq total) by mouth daily. 450 mL 9   No current facility-administered medications on file prior to visit.  Allergies  Allergen Reactions   Amlodipine Swelling and Palpitations    Swelling, heart pounding    Penicillins Hives    Has patient had a PCN reaction causing immediate rash, facial/tongue/throat swelling, SOB or lightheadedness with hypotension: yes Has patient had a PCN reaction causing severe rash involving mucus membranes or skin necrosis: no Has patient had a PCN reaction that required hospitalization: no Has patient had a PCN reaction occurring within the last 10 years: no If all of the above answers are "NO", then may proceed with Cephalosporin    Cardizem Cd [Diltiazem] Other (See Comments)    Pt reports this med causes her fatigue, dizziness, lightheaded, sore throat,  and hard swallowing meals.   Lisinopril     Patient refused prior rx due to potential side effects outlined in medication information   Losartan Other (See Comments)    Pt reports causes frequent urination and UTIs   Propranolol Other (See Comments)    Pt reports causes flushing    Rosuvastatin Other (See Comments)    Pt reports caused her to feel nauseated, dizzy, and lightheaded.    Ciprofloxacin Palpitations   Simvastatin Other (See Comments)    Increased urination    Assessment/Plan:  1. Hyperlipidemia - Most recent LDL of 109 not at goal <100 mg/dL, though improved from 140 in 2020 through diet and exercise. She is intolerant to multiple statins and ezetimibe though reports side effects not commonly associated with these medications. Recommend trying low dose pravastatin even just a few days a week to see if she can tolerate this but she was not interested in trying any more statins. Nexletol is not covered by her insurance. Repatha and Praluent are both tier 3 medications. Since she has both Medicare and Medicaid the cost should be covered well once the PA is approved. Reviewed LDL lowering with PCSK9i and cardiac benefit. At this time, she would like to continue with increasing her exercise and maintaining a healthy diet to see if it can come down any further since she is close to her goal. She has an office visit in January and would like to recheck her lipids at that time. If LDL is still not at goal, she reports being open to discussing PCSK9i again. Provided her with the phone number of the pharmacist's office in the event she changes her mind between now and then and would like to move forward with starting treatment. Otherwise, if LDL is not at goal in January, we are happy to see her again at that time to discuss starting PCSK9i treatment with either Repatha or Praluent.    Rebbeca Paul, PharmD PGY2 Ambulatory Care Pharmacy Resident 01/22/2021 2:15 PM

## 2021-01-22 ENCOUNTER — Other Ambulatory Visit: Payer: Self-pay

## 2021-01-22 ENCOUNTER — Ambulatory Visit (INDEPENDENT_AMBULATORY_CARE_PROVIDER_SITE_OTHER): Payer: Medicare Other | Admitting: Student-PharmD

## 2021-01-22 DIAGNOSIS — E785 Hyperlipidemia, unspecified: Secondary | ICD-10-CM | POA: Diagnosis not present

## 2021-01-22 NOTE — Patient Instructions (Addendum)
Nice to see you today!  Keep up the good work with diet and exercise. Aim for a diet full of vegetables, fruit and lean meats (chicken, [place], fish). Try to limit carbs (bread, pasta, sugar, rice) and red meat consumption.  Your goal LDL is less than 100 mg/dL, you're currently at 109 mg/dL  Medication Changes: No changes today.   Work more on diet and exercise. We'll plan to check your cholesterol at your next visit in January. If your LDL is still above 100 we'll consider starting a PCSK9 inhibitor, which is the injection we talked about today that can lower your cholesterol by 60%. The names of these medications are either Repatha or Praluent. Your insurance covers both equally so we would start one or the other.   Please give [place] a call at [phone] with any questions or concerns.

## 2021-01-31 ENCOUNTER — Other Ambulatory Visit: Payer: Self-pay | Admitting: Internal Medicine

## 2021-03-04 ENCOUNTER — Telehealth: Payer: Self-pay | Admitting: *Deleted

## 2021-03-04 NOTE — Telephone Encounter (Signed)
   Pre-operative Risk Assessment    Patient Name: Victoria Cardenas  DOB: Sep 12, 1950 MRN: [id number]      Request for Surgical Clearance   Procedure:   RIGHT KNEE SCOPE PARTIAL MENISECTOMY  Date of Surgery: Clearance TBD                                 Surgeon:  DR. Edmonia Lynch Surgeon's Group or Practice Name:  [facility] Phone number:  [phone] Fax number:  [phone] ATTN: KELLY EXT [id number]   Type of Clearance Requested: - Medical  - Pharmacy:  Hold Aspirin     Type of Anesthesia:   CHOICE   Additional requests/questions:   Jiles Prows   03/04/2021, 3:34 PM

## 2021-03-05 NOTE — Telephone Encounter (Signed)
   Name: Victoria Cardenas  DOB: Mar 05, 1951  MRN: [id number]   Primary Cardiologist: Freada Bergeron, MD  Chart reviewed as part of pre-operative protocol coverage. Patient was contacted 03/05/2021 in reference to pre-operative risk assessment for pending surgery as outlined below.  Victoria Cardenas was last seen on 01/01/2021 by Victoria Montana NP.  Since that day, Victoria Cardenas has done well without chest pain or worsening dyspnea.  Coronary CTA obtained in September 2022 showed clean coronary arteries.  Therefore, based on ACC/AHA guidelines, the patient would be at acceptable risk for the planned procedure without further cardiovascular testing.   Patient may hold aspirin for 7 days prior to the procedure and restart after the procedure at the discretion of the surgeon.  Note, she says she is not taking aspirin daily, instead she is currently taking aspirin every other day.  The patient was advised that if she develops new symptoms prior to surgery to contact our office to arrange for a follow-up visit, and she verbalized understanding.  I will route this recommendation to the requesting party via Epic fax function and remove from pre-op pool. Please call with questions.  [place], [place] 03/05/2021, 6:06 PM

## 2021-04-15 ENCOUNTER — Ambulatory Visit: Payer: Medicare Other | Admitting: Physician Assistant

## 2021-05-01 ENCOUNTER — Other Ambulatory Visit: Payer: Self-pay | Admitting: Student

## 2021-05-01 DIAGNOSIS — Z1231 Encounter for screening mammogram for malignant neoplasm of breast: Secondary | ICD-10-CM

## 2021-05-06 ENCOUNTER — Other Ambulatory Visit: Payer: Self-pay | Admitting: Internal Medicine

## 2021-05-06 DIAGNOSIS — I471 Supraventricular tachycardia: Secondary | ICD-10-CM | POA: Insufficient documentation

## 2021-05-06 DIAGNOSIS — Z8249 Family history of ischemic heart disease and other diseases of the circulatory system: Secondary | ICD-10-CM | POA: Insufficient documentation

## 2021-05-06 NOTE — Progress Notes (Signed)
Cardiology Office Note:    Date:  05/07/2021   ID:  Victoria Cardenas, DOB 02-05-1951, MRN [id number]  PCP:  Cipriano Mile, NP   [facility] Providers Cardiologist:  Freada Bergeron, MD     Referring MD: Cipriano Mile, NP   Chief Complaint:  F/u for chest pain, SVT/palpitations    Patient Profile:   Victoria Cardenas is a 71 y.o. female with:  Supraventricular Tachycardia Hypertension  Hx of multiple med intolerances >> off all meds Grave's disease, Hyperthyroidism  Hyperlipidemia  R Breast CA s/p lumpectomy in 2005 Mild MR NSVT Coronary CTA 9/22: no CAD, CAC score 0  Prior CV studies: Coronary CTA 01/09/21 CAC Score 0 No significant CAD Probable cyst in lower outer quadrant of R breast  Echocardiogram 10/05/2018 EF 60-65, no RWMA, normal RVSF  Cardiac Monitor 08/2018 Sinus tach cardia to sinus tachycardia. 1 episode of ventricular tachycardia for total of 9 beats with ventricular rate 170 bpm. 43 episodes of SVTs some of which seem to represent atrial fibrillation.  Myoview 05/20/11 [facility]) Normal   History of Present Illness: Victoria Cardenas was previously seen by Dr. [name] and established with Dr. Johney Frame in 6/22.  She was last seen in clinic in 9/22 by Victoria Montana, NP for f/u after an ED visit with chest pain.  A CCTA was obtained and demonstrated no CAD and a CAC score of 0.  She was also seen by the Lipid Clinic due to intol to statins, ezetimibe.  PCSK9i was discussed but she opted to continue lifestyle modifications with recheck of Lipids at next OV.  She returns for f/u.  She is here alone.  She has not had further chest pain.  She has not had shortness of breath, syncope, orthopnea, leg edema.  She has occasional, brief palpitations.  She has not had any prolonged, rapid palpitations.   ASSESSMENT & PLAN:   SVT (supraventricular tachycardia) (HCC) Overall stable.  She is intol to multiple medications and does not take any rate controlling  medications.  We discussed having a Rx for an as needed beta-blocker.  She prefers to hold off on this for now. F/u 1 year.   Hyperlipidemia She is intol to multiple medications.  She saw the PharmD in the [facility] a few months ago.  The plan was to work on diet and recheck fasting lipids today.     -Fasting Lipids  Essential hypertension Fair control.  She has some low readings at home.  She eats some extra salt to bring this up.  As noted, she has multiple medication intolerances.  Continue current management.    Family history of coronary artery disease CCTA was neg for CAD and CAC score was 0.  She really does not need ASA.  DC ASA.        Dispo:  Return in about 1 year (around 05/07/2022) for Routine follow up in 1 year with Dr. Johney Frame. .      Past Medical History:  Diagnosis Date   Breast cancer ([place]) 2005   right breast   Dizziness    Glaucoma    Hypercholesteremia    Hypertension    Medication intolerance    multiple   Mild mitral regurgitation    NSVT (nonsustained ventricular tachycardia)    Paresthesia    Shortened PR interval    SVT (supraventricular tachycardia) (HCC)    Thyroid disease    Current Medications: Current Meds  Medication Sig   cholecalciferol (VITAMIN D3)  25 MCG (1000 UT) tablet Take 1,000 Units by mouth daily.   ezetimibe (ZETIA) 10 MG tablet Take 10 mg by mouth daily.   LUMIGAN 0.01 % SOLN Place 1 drop into both eyes at bedtime.   methimazole (TAPAZOLE) 5 MG tablet Take 0.5 tablets (2.5 mg total) by mouth daily.   [DISCONTINUED] aspirin EC 81 MG tablet Take 81 mg by mouth every other day.    Allergies:   Amlodipine, Penicillins, Cardizem cd [diltiazem], Lisinopril, Losartan, Propranolol, Rosuvastatin, Ciprofloxacin, and Simvastatin   Social History   Tobacco Use   Smoking status: Never   Smokeless tobacco: Never  Vaping Use   Vaping Use: Never used  Substance Use Topics   Alcohol use: No   Drug use: No    Family Hx: The  patient's family history includes Breast cancer in her maternal aunt and maternal grandmother; Breast cancer (age of onset: 87) in her mother; Heart attack in her sister; Hypertension in her mother.  ROS see HPI  EKGs/Labs/Other Test Reviewed:    EKG:  EKG is not ordered today.  The ekg ordered today demonstrates n/a  Recent Labs: 08/04/2020: Magnesium 1.9 11/22/2020: TSH 2.31 12/05/2020: ALT 17; Hemoglobin 14.1; Platelets 207 01/07/2021: BUN 10; Creatinine, Ser 0.81; Potassium 3.9; Sodium 142   Recent Lipid Panel Lab Results  Component Value Date/Time   CHOL 169 12/03/2020 08:51 AM   TRIG 121 12/03/2020 08:51 AM   HDL 38 (L) 12/03/2020 08:51 AM   LDLCALC 109 (H) 12/03/2020 08:51 AM     Risk Assessment/Calculations:          Physical Exam:    VS:  BP 138/84 (BP Location: Left Arm, Patient Position: Sitting, Cuff Size: Normal)    Pulse 72    Ht 5\' 3"  (1.6 m)    Wt 119 lb 6.4 oz (54.2 kg)    SpO2 100%    BMI 21.15 kg/m     Wt Readings from Last 3 Encounters:  05/07/21 119 lb 6.4 oz (54.2 kg)  01/01/21 117 lb 12.8 oz (53.4 kg)  12/05/20 115 lb 15.4 oz (52.6 kg)    Constitutional:      Appearance: Healthy appearance. Not in distress.  Neck:     Vascular: JVD normal.  Pulmonary:     Effort: Pulmonary effort is normal.     Breath sounds: No wheezing. No rales.  Cardiovascular:     Normal rate. Regular rhythm. Normal S1. Normal S2.      Murmurs: There is no murmur.  Edema:    Peripheral edema absent.  Abdominal:     Palpations: Abdomen is soft.  Skin:    General: Skin is warm and dry.  Neurological:     Mental Status: Alert and oriented to person, place and time.     Cranial Nerves: Cranial nerves are intact.       Medication Adjustments/Labs and Tests Ordered: Current medicines are reviewed at length with the patient today.  Concerns regarding medicines are outlined above.  Tests Ordered: Orders Placed This Encounter  Procedures   Lipid Profile   Medication  Changes: No orders of the defined types were placed in this encounter.  Signed, Richardson Dopp, PA-C  05/07/2021 10:47 AM    [facility] [place], [place], [place]  [address] Phone: [phone]; Fax: [phone]

## 2021-05-07 ENCOUNTER — Encounter: Payer: Self-pay | Admitting: Physician Assistant

## 2021-05-07 ENCOUNTER — Other Ambulatory Visit: Payer: Self-pay

## 2021-05-07 ENCOUNTER — Ambulatory Visit (INDEPENDENT_AMBULATORY_CARE_PROVIDER_SITE_OTHER): Payer: Medicare Other | Admitting: Physician Assistant

## 2021-05-07 VITALS — BP 138/84 | HR 72 | Ht 63.0 in | Wt 119.4 lb

## 2021-05-07 DIAGNOSIS — I10 Essential (primary) hypertension: Secondary | ICD-10-CM

## 2021-05-07 DIAGNOSIS — E7800 Pure hypercholesterolemia, unspecified: Secondary | ICD-10-CM

## 2021-05-07 DIAGNOSIS — Z8249 Family history of ischemic heart disease and other diseases of the circulatory system: Secondary | ICD-10-CM

## 2021-05-07 DIAGNOSIS — I471 Supraventricular tachycardia: Secondary | ICD-10-CM

## 2021-05-07 LAB — LIPID PANEL
Chol/HDL Ratio: 4.5 ratio — ABNORMAL HIGH (ref 0.0–4.4)
Cholesterol, Total: 188 mg/dL (ref 100–199)
HDL: 42 mg/dL (ref >39)
LDL Chol Calc (NIH): 131 mg/dL — ABNORMAL HIGH (ref 0–99)
Triglycerides: 83 mg/dL (ref 0–149)
VLDL Cholesterol Cal: 15 mg/dL (ref 5–40)

## 2021-05-07 NOTE — Assessment & Plan Note (Signed)
She is intol to multiple medications.  She saw the PharmD in the [facility] a few months ago.  The plan was to work on diet and recheck fasting lipids today.     -Fasting Lipids

## 2021-05-07 NOTE — Assessment & Plan Note (Signed)
Fair control.  She has some low readings at home.  She eats some extra salt to bring this up.  As noted, she has multiple medication intolerances.  Continue current management.

## 2021-05-07 NOTE — Assessment & Plan Note (Signed)
Overall stable.  She is intol to multiple medications and does not take any rate controlling medications.  We discussed having a Rx for an as needed beta-blocker.  She prefers to hold off on this for now. F/u 1 year.

## 2021-05-07 NOTE — Patient Instructions (Signed)
Medication Instructions:   DISCONTINUE Asprin.  *If you need a refill on your cardiac medications before your next appointment, please call your pharmacy*   Lab Work:  TODAY!!!!! LIPID  If you have labs (blood work) drawn today and your tests are completely normal, you will receive your results only by: Central (if you have MyChart) OR A paper copy in the mail If you have any lab test that is abnormal or we need to change your treatment, we will call you to review the results.   Testing/Procedures:  -NONE   Follow-Up: At [facility], you and your health needs are our priority.  As part of our continuing mission to provide you with exceptional heart care, we have created designated Provider Care Teams.  These Care Teams include your primary Cardiologist (physician) and Advanced Practice Providers (APPs -  Physician Assistants and Nurse Practitioners) who all work together to provide you with the care you need, when you need it.  We recommend signing up for the patient portal called "MyChart".  Sign up information is provided on this After Visit Summary.  MyChart is used to connect with patients for Virtual Visits (Telemedicine).  Patients are able to view lab/test results, encounter notes, upcoming appointments, etc.  Non-urgent messages can be sent to your provider as well.   To learn more about what you can do with MyChart, go to [email or web address].    Your next appointment:   1 year(s)  The format for your next appointment:   In Person  Provider:   Freada Bergeron, MD     Other Instructions  Your physician wants you to follow-up in: 1 year with Dr. Johney Frame.  You will receive a reminder letter in the mail two months in advance. If you don't receive a letter, please call our office to schedule the follow-up appointment.

## 2021-05-07 NOTE — Assessment & Plan Note (Signed)
CCTA was neg for CAD and CAC score was 0.  She really does not need ASA.  DC ASA.

## 2021-05-20 ENCOUNTER — Ambulatory Visit (INDEPENDENT_AMBULATORY_CARE_PROVIDER_SITE_OTHER): Payer: Medicare Other | Admitting: Internal Medicine

## 2021-05-20 ENCOUNTER — Other Ambulatory Visit: Payer: Self-pay

## 2021-05-20 ENCOUNTER — Encounter: Payer: Self-pay | Admitting: Internal Medicine

## 2021-05-20 VITALS — BP 130/84 | HR 67 | Ht 63.0 in | Wt 119.0 lb

## 2021-05-20 DIAGNOSIS — E0500 Thyrotoxicosis with diffuse goiter without thyrotoxic crisis or storm: Secondary | ICD-10-CM | POA: Diagnosis not present

## 2021-05-20 DIAGNOSIS — E042 Nontoxic multinodular goiter: Secondary | ICD-10-CM | POA: Diagnosis not present

## 2021-05-20 DIAGNOSIS — E0590 Thyrotoxicosis, unspecified without thyrotoxic crisis or storm: Secondary | ICD-10-CM

## 2021-05-20 LAB — T4, FREE: Free T4: 0.84 ng/dL (ref 0.60–1.60)

## 2021-05-20 LAB — TSH: TSH: 1.61 u[IU]/mL (ref 0.35–5.50)

## 2021-05-20 NOTE — Progress Notes (Signed)
Name: Victoria Cardenas  MRN/ DOB: [id number], 09/20/1950    Age/ Sex: 71 y.o., female     PCP: Hillery Aldo, NP   Reason for Endocrinology Evaluation: Subclinical hyperthyroidism     Initial Endocrinology Clinic Visit: 08/25/2018    PATIENT IDENTIFIER: Victoria Cardenas is a 71 y.o., female with a past medical history of HTN, Hx of breast cancer (S/P lumpectomy ) and Osteoporosis   . She has followed with [facility] since 08/25/2018 for consultative assistance with management of her Subclinical hyperthyroidism.   HISTORICAL SUMMARY:   Pt was noted with low TSH in 11/2016, at the time she presented to the ED for palpitations.    In April, 2020 she presented again with palpitations and sob and was found to have a TSH of 0.056 uIU/mL with normal T3 and T4. This was attributed to Graves' Disease due to elevated TRAb level of 3.34 IU/L.   - She is S/P benign FNA of the right mid nodule in 2018 , other sub-centimeter nodules present on ultrasound bilaterally but did not require further follow up.  -Repeat thyroid ultrasound in 04/2019 showed stable right mid nodule at 2.9 cm    No FH of thyroid disease.    Osteoporosis : She was diagnosed with osteoporosis 2017, and was started on boniva at the time, but pt stopped it due to palpitations and diarrhea ~ 2 yrs ago.  Currently she is only on Calcium and Vitamin D 2 tablets daily  She declines antiresorptive therapy      SUBJECTIVE:    Today (05/20/2021):  Victoria Cardenas is here for a  4 months follow up visit on subclinical hyperthyroidism secondary to Graves' Disease.    Denies nausea, vomiting or abdominal pain  Has occasional neck tightness  Has occasional palpitations when rushing around   Medications: Methimazole 2.5 mg daily     HISTORY:  Past Medical History:  Past Medical History:  Diagnosis Date   Breast cancer (HCC) 2005   right breast   Dizziness    Glaucoma    Hypercholesteremia     Hypertension    Medication intolerance    multiple   Mild mitral regurgitation    NSVT (nonsustained ventricular tachycardia)    Paresthesia    Shortened PR interval    SVT (supraventricular tachycardia) (HCC)    Thyroid disease    Past Surgical History:  Past Surgical History:  Procedure Laterality Date   ABDOMINAL HYSTERECTOMY     BREAST EXCISIONAL BIOPSY Left 10/10/2015   benign   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 She has never used smokeless tobacco. She reports that she does not drink alcohol and does not use drugs. Family History:  Family History  Problem Relation Age of Onset   Hypertension Mother    Breast cancer Mother 35   Heart attack Sister    Breast cancer Maternal Grandmother    Breast cancer Maternal Aunt      HOME MEDICATIONS: Allergies as of 05/20/2021       Reactions   Amlodipine Swelling, Palpitations   Swelling, heart pounding   Penicillins Hives   Has patient had a PCN reaction causing immediate rash, facial/tongue/throat swelling, SOB or lightheadedness with hypotension: yes Has patient had a PCN reaction causing severe rash involving mucus membranes or skin necrosis: no Has patient had a PCN reaction that required hospitalization: no Has patient had a PCN reaction occurring  within the last 10 years: no If all of the above answers are "NO", then may proceed with Cephalosporin   Cardizem Cd [diltiazem] Other (See Comments)   Pt reports this med causes her fatigue, dizziness, lightheaded, sore throat, and hard swallowing meals.   Lisinopril    Patient refused prior rx due to potential side effects outlined in medication information   Losartan Other (See Comments)   Pt reports causes frequent urination and UTIs   Propranolol Other (See Comments)   Pt reports causes flushing    Rosuvastatin Other (See Comments)   Pt reports caused her to feel nauseated, dizzy, and lightheaded.     Ciprofloxacin Palpitations   Simvastatin Other (See Comments)   Increased urination        Medication List        Accurate as of May 20, 2021 12:00 PM. If you have any questions, ask your nurse or doctor.          cholecalciferol 25 MCG (1000 UNIT) tablet Commonly known as: VITAMIN D3 Take 1,000 Units by mouth daily.   ezetimibe 10 MG tablet Commonly known as: ZETIA Take 10 mg by mouth daily.   Lumigan 0.01 % Soln Generic drug: bimatoprost Place 1 drop into both eyes at bedtime.   methimazole 5 MG tablet Commonly known as: TAPAZOLE Take 0.5 tablets (2.5 mg total) by mouth daily.          OBJECTIVE:   PHYSICAL EXAM: VS: BP 130/84 (BP Location: Left Arm, Patient Position: Sitting, Cuff Size: Small)    Pulse 67    Ht $R'5\' 3"'iG$  (1.6 m)    Wt 119 lb (54 kg)    SpO2 95%    BMI 21.08 kg/m    EXAM: General: Pt appears well and is in NAD  Neck: General: Supple without adenopathy. Thyroid: Thyroid size normal.  No goiter or nodules appreciated.  Lungs: Clear with good BS bilat with no rales, rhonchi, or wheezes  Heart: Auscultation: RRR.  Abdomen: Normoactive bowel sounds, soft, nontender, without masses or organomegaly palpable  Extremities:  BL LE: No pretibial edema normal ROM and strength.  Mental Status: Judgment, insight: Intact Orientation: Oriented to time, place, and person Mood and affect: No depression, anxiety, or agitation     DATA REVIEWED:   Latest Reference Range & Units 05/20/21 12:08  TSH 0.35 - 5.50 uIU/mL 1.61  T4,Free(Direct) 0.60 - 1.60 ng/dL 0.84    Latest Reference Range & Units 01/07/21 10:12  Sodium 134 - 144 mmol/L 142  Potassium 3.5 - 5.2 mmol/L 3.9  Chloride 96 - 106 mmol/L 104  CO2 20 - 29 mmol/L 25  Glucose 65 - 99 mg/dL 107 (H)  BUN 8 - 27 mg/dL 10  Creatinine 0.57 - 1.00 mg/dL 0.81  Calcium 8.7 - 10.3 mg/dL 9.0  BUN/Creatinine Ratio 12 - 28  12  eGFR >59 mL/min/1.73 78    Latest Reference Range & Units 12/05/20  16:04  WBC 4.0 - 10.5 K/uL 4.4  RBC 3.87 - 5.11 MIL/uL 4.43  Hemoglobin 12.0 - 15.0 g/dL 14.1  HCT 36.0 - 46.0 % 41.6  MCV 80.0 - 100.0 fL 93.9  MCH 26.0 - 34.0 pg 31.8  MCHC 30.0 - 36.0 g/dL 33.9  RDW 11.5 - 15.5 % 11.6  Platelets 150 - 400 K/uL 207  nRBC 0.0 - 0.2 % 0.0     Results for KEEYA, DYCKMAN (MRN [id number]) as of 10/20/2018 09:48  Ref. Range 09/10/2018 11:13  TRAB Latest Ref  Range: <=2.00 IU/L 3.34 (H)   Thyroid Ultrasound 12/19/2020  Estimated total number of nodules >/= 1 cm: 3   Number of spongiform nodules >/=  2 cm not described below (TR1): 0   Number of mixed cystic and solid nodules >/= 1.5 cm not described below (TR2): 0   _________________________________________________________   Nodule # 1: Previously biopsied right mid thyroid nodule measuring 3.0 x 1.5 x 2.1 cm is not significantly changed in size since 07/23/2015 where it measured 2.9 x 1.8 x 1.4 cm. Please correlate with FNA results.   _________________________________________________________   Nodule 2: 1.0 cm spongiform nodule in the superior left thyroid lobe does not meet criteria for FNA or surveillance.   _________________________________________________________   Nodule 3: 1.0 x 0.8 x 0.9 cm solid hypoechoic nodule in the mid left thyroid lobe is not significantly changed in size since 07/23/2015 where it measured 0.9 x 0.8 x 0.7 cm. Stability greater than 5 years indicates benign etiology.   IMPRESSION: 1. Previously biopsied right thyroid nodule does not demonstrate significant growth since 2017. Please correlate with FNA results. 2. 1.0 x 0.8 x 0.9 cm solid hypoechoic nodule in the left mid thyroid lobe does not demonstrate significant growth since 2017 which indicates a benign etiology.       FNA RMP nodule 2.9x1.4x1.8 (11/27/2015)   CONSISTENT WITH BENIGN FOLLICULAR NODULE (BETHESDA CATEGORY II).  ASSESSMENT / PLAN / RECOMMENDATIONS:   Subclinical Hyperthyroidism     - This is partly due to Graves' Disease but toxic thyroid nodule can not be excluded - Patient is clinically euthyroid - She is tolerating methimazole well.  - No local neck symptoms -She will return for labs as we do not have a phlebotomist today  Medications  Continue Methimazole 2.5 mg daily     2. Multinodular Goiter    - No local neck symptoms  - She is S/P benign FNA of the right mid nodule in 2017 , other sub-centimeter nodules present on ultrasound bilaterally but did not require further follow up.  -Repeat thyroid ultrasound in 11/2020 showed stable right mid nodule at 2.9 cm   3. Graves' Disease:  - No evidence of extra-thyroidal manifestations of graves' disease, pt advised to notify her ophthalmologist of this diagnosis.     F/u in 6 month     Signed electronically by: Mack Guise, MD  [facility]  [facility] [address] [place], [place] [address] Phone: [phone] FAX: [phone]      CC: Cipriano Mile, NP [place] [address] Phone: [phone]  Fax: [phone]   Return to Endocrinology clinic as below: Future Appointments  Date Time Provider O[place]  05/20/2021  1:00 PM Falisha Osment, Melanie Crazier, MD [facility] None  05/23/2021  1:30 PM [facility] PHARMACIST [facility]  05/27/2021  2:30 PM [facility] DX DEXA 1 [facility]  06/10/2021 10:40 AM [facility] MM 3 [facility]

## 2021-05-21 ENCOUNTER — Encounter: Payer: Self-pay | Admitting: Internal Medicine

## 2021-05-21 MED ORDER — METHIMAZOLE 5 MG PO TABS: 2.5000 mg | ORAL_TABLET | Freq: Every day | ORAL | 3 refills | Status: DC

## 2021-05-23 ENCOUNTER — Other Ambulatory Visit: Payer: Self-pay

## 2021-05-23 ENCOUNTER — Ambulatory Visit (INDEPENDENT_AMBULATORY_CARE_PROVIDER_SITE_OTHER): Payer: Medicare Other | Admitting: Pharmacist

## 2021-05-23 DIAGNOSIS — E7800 Pure hypercholesterolemia, unspecified: Secondary | ICD-10-CM

## 2021-05-23 NOTE — Progress Notes (Signed)
Patient ID: Victoria Cardenas                 DOB: 16-Oct-1950                    MRN: [id number]     HPI: Victoria Cardenas is a 71 y.o. female patient of Dr. Johney Frame referred to lipid clinic by Laurann Montana, NP. PMH is significant for SVT, palpitations, HTN, HLD, Graves disease. ED visit in August 2022 for atypical chest pain that resolved with GI cocktail, EKG no acute changes. Cardiac CT on 01/09/21 showed calcium score of 0 and no significant coronary disease noted. Started on ezetimibe when last seen by Dr. Johney Frame in June but noted to be intolerant at visit with [facility]. Her 1- year ASCVD risk score using the pooled cohort equation is 13.1%.   Patient was seen in Sept 22 by PharmD. PCSK9i were discussed but patient was not interested at the time. Her insurance at that time did not cover Nexletol. Repeat labs in Jan 23 showed increase in LDL-C to 131. Patient was willing to meet with lipid clinic to reconsider PCSK9i.   Today, patient arrives in good spirits. She states she read up PCSK9i and she thinks it has too many side effects. Feels like it will hurt her more than help her.   Current Medications: none Intolerances: simvastatin 10 and 20 mg (increased urination), rosuvastatin 5 mg (nausea, dizzy, lightheaded), ezetimibe (palpitations, diarrhea, lightheaded)  Risk Factors: HTN, HLD, FHx ASCVD LDL goal: <100 mg/dL  Diet: stays away from salt, cooks a lot at home, no fried foods, egg once per week, rarely eats out but if she does gets grilled chicken, rare red meat - mostly [place] and chicken, TV dinner once a month, tries to stay away from processed foods, reads nutrition labels to buy low cholesterol foods. Only eats something sweet once a week. Drinks water, cranberry juice, orange juice, pomegranate juice.   Exercise: uses public transportation, walks a lot to the store, has a treadmill at home that she uses on days she doesn't leave the house, lives on the 3rd floor and takes her dog  out multiple times per day  Family History: Hypertension in her mother, younger sister had MI (age 12)  Social History: Never smoker  Labs: 05/07/21 TC 188, TG 83, HDL 42, LDL-C 131 (no meds) 12/03/20: TC 169, TG 121, HDL 38, LDL 109 (no meds) 04/14/19: TC 200, TG 133, HDL 36, LDL 140 (no meds)  Past Medical History:  Diagnosis Date   Breast cancer ([facility]) 2005   right breast   Dizziness    Glaucoma    Hypercholesteremia    Hypertension    Medication intolerance    multiple   Mild mitral regurgitation    NSVT (nonsustained ventricular tachycardia)    Paresthesia    Shortened PR interval    SVT (supraventricular tachycardia) (HCC)    Thyroid disease     Current Outpatient Medications on File Prior to Visit  Medication Sig Dispense Refill   cholecalciferol (VITAMIN D3) 25 MCG (1000 UT) tablet Take 1,000 Units by mouth daily.     ezetimibe (ZETIA) 10 MG tablet Take 10 mg by mouth daily.     LUMIGAN 0.01 % SOLN Place 1 drop into both eyes at bedtime.     methimazole (TAPAZOLE) 5 MG tablet Take 0.5 tablets (2.5 mg total) by mouth daily. 45 tablet 3   No current facility-administered medications on file prior to  visit.    Allergies  Allergen Reactions   Amlodipine Swelling and Palpitations    Swelling, heart pounding    Penicillins Hives    Has patient had a PCN reaction causing immediate rash, facial/tongue/throat swelling, SOB or lightheadedness with hypotension: yes Has patient had a PCN reaction causing severe rash involving mucus membranes or skin necrosis: no Has patient had a PCN reaction that required hospitalization: no Has patient had a PCN reaction occurring within the last 10 years: no If all of the above answers are "NO", then may proceed with Cephalosporin    Cardizem Cd [Diltiazem] Other (See Comments)    Pt reports this med causes her fatigue, dizziness, lightheaded, sore throat, and hard swallowing meals.   Lisinopril     Patient refused prior rx due to  potential side effects outlined in medication information   Losartan Other (See Comments)    Pt reports causes frequent urination and UTIs   Propranolol Other (See Comments)    Pt reports causes flushing    Rosuvastatin Other (See Comments)    Pt reports caused her to feel nauseated, dizzy, and lightheaded.    Ciprofloxacin Palpitations   Simvastatin Other (See Comments)    Increased urination    Assessment/Plan:  1. Hyperlipidemia - Most recent LDL of 131 not at goal <100 mg/dL. We discussed her concerns about PCKS9i. Reviewed side effects. Discussed that this is her most effective options. Patient declined. She asked about other options. Advised that last year Nexletol was not covered. We could check coverage this year. Reviewed side effects of Nexletol. Patient would like me to see if her insurance will cover. I will submit prior auth. Encouraged patient to continue to stay active and limit sugar as she is doing.  Ramond Dial, Pharm.D, BCPS, CPP [place]  [address] [place], [place] [address]  Phone: [phone]; Fax: [phone]   05/23/2021 11:08 AM

## 2021-05-23 NOTE — Patient Instructions (Signed)
I will see if your insurance will cover Nexletol. I will contact you when I hear back.  Call me at [phone] with any questions

## 2021-05-24 ENCOUNTER — Telehealth: Payer: Self-pay | Admitting: Pharmacist

## 2021-05-24 NOTE — Telephone Encounter (Signed)
Patient called back. I informed her that Nexletol was denied. Our only option was Repatha, which has been approved. She is seeing her PCP soon and wishes to discuss with her first.

## 2021-05-24 NOTE — Telephone Encounter (Signed)
Nexletol PA was denied. Will not pay for primary prevention for this medication. Repatha was approved but patient expressed unwillingness to try this medication during office visit.  Called pt and LVM to discuss.

## 2021-05-27 ENCOUNTER — Inpatient Hospital Stay: Admission: RE | Admit: 2021-05-27 | Payer: Medicare Other | Source: Ambulatory Visit

## 2021-05-29 ENCOUNTER — Telehealth: Payer: Self-pay

## 2021-05-29 NOTE — Telephone Encounter (Signed)
Patient notified of result and will continue medication

## 2021-06-10 ENCOUNTER — Ambulatory Visit: Payer: Medicare Other

## 2021-06-17 ENCOUNTER — Ambulatory Visit: Payer: Medicare Other

## 2021-06-18 ENCOUNTER — Ambulatory Visit: Payer: Medicare Other

## 2021-06-19 ENCOUNTER — Other Ambulatory Visit: Payer: Self-pay

## 2021-06-19 ENCOUNTER — Ambulatory Visit
Admission: RE | Admit: 2021-06-19 | Discharge: 2021-06-19 | Disposition: A | Discharge status: Home / Self Care | Payer: Medicare Other | Source: Ambulatory Visit | Attending: Student | Admitting: Student

## 2021-06-19 DIAGNOSIS — Z1231 Encounter for screening mammogram for malignant neoplasm of breast: Secondary | ICD-10-CM

## 2021-07-15 ENCOUNTER — Other Ambulatory Visit: Payer: Self-pay

## 2021-07-15 MED ORDER — EZETIMIBE 10 MG PO TABS: 10.0000 mg | ORAL_TABLET | Freq: Every day | ORAL | 3 refills | Status: DC

## 2021-10-31 ENCOUNTER — Ambulatory Visit
Admission: RE | Admit: 2021-10-31 | Discharge: 2021-10-31 | Disposition: A | Discharge status: Home / Self Care | Payer: Medicare Other | Source: Ambulatory Visit | Attending: Internal Medicine | Admitting: Internal Medicine

## 2021-10-31 DIAGNOSIS — M810 Age-related osteoporosis without current pathological fracture: Secondary | ICD-10-CM

## 2021-11-04 ENCOUNTER — Telehealth: Payer: Self-pay | Admitting: Internal Medicine

## 2021-11-04 DIAGNOSIS — M810 Age-related osteoporosis without current pathological fracture: Secondary | ICD-10-CM

## 2021-11-04 MED ORDER — ALENDRONATE SODIUM 70 MG PO TABS: 70.0000 mg | ORAL_TABLET | ORAL | 2 refills | Status: DC

## 2021-11-04 NOTE — Telephone Encounter (Signed)
Spoke to the patient on 11/04/2021 and discussed osteoporosis as below  Historically she has been on Boniva in the past with palpitations, I did recommend antiresorptive therapy to improve her bone density which she is in agreement of   We discussed side effects in form of GERD, she was advised to take it once a week with a glass of water     DXA   The BMD measured at Femur Neck Left is 0.583 g/cm2 with a T-score of -3.3. This patient is considered osteoporotic according to [facility]) criteria.   The quality of the exam is good.   Site Region Measured Date Measured Age YA BMD Significant CHANGE T-score DualFemur Neck Left  10/31/2021    71.3         -3.3    0.583 g/cm2 DualFemur Neck Left  12/22/2017    68.4         -3.3    0.582 g/cm2   AP Spine  L1-L4      10/31/2021    [age]         -3.1    0.806 g/cm2 AP Spine  L1-L4      12/22/2017    67.4         -3.1    0.815 g/cm2   DualFemur Total Mean 10/31/2021    71.3         -2.7    0.664 g/cm2 DualFemur Total Mean 12/22/2017    68.4         -2.7    0.662 g/cm2   Medication Start alendronate 70 mg weekly

## 2021-12-13 ENCOUNTER — Other Ambulatory Visit: Payer: Self-pay | Admitting: Internal Medicine

## 2021-12-13 DIAGNOSIS — K5900 Constipation, unspecified: Secondary | ICD-10-CM

## 2021-12-16 ENCOUNTER — Other Ambulatory Visit: Payer: Medicare Other

## 2021-12-19 ENCOUNTER — Ambulatory Visit
Admission: RE | Admit: 2021-12-19 | Discharge: 2021-12-19 | Disposition: A | Discharge status: Home / Self Care | Payer: Medicare Other | Source: Ambulatory Visit | Attending: Internal Medicine | Admitting: Internal Medicine

## 2021-12-19 DIAGNOSIS — K5900 Constipation, unspecified: Secondary | ICD-10-CM

## 2022-03-05 DIAGNOSIS — H43813 Vitreous degeneration, bilateral: Secondary | ICD-10-CM | POA: Diagnosis not present

## 2022-03-05 DIAGNOSIS — H04123 Dry eye syndrome of bilateral lacrimal glands: Secondary | ICD-10-CM | POA: Diagnosis not present

## 2022-03-05 DIAGNOSIS — H401131 Primary open-angle glaucoma, bilateral, mild stage: Secondary | ICD-10-CM | POA: Diagnosis not present

## 2022-04-10 DIAGNOSIS — I4719 Other supraventricular tachycardia: Secondary | ICD-10-CM | POA: Diagnosis not present

## 2022-04-10 DIAGNOSIS — E0500 Thyrotoxicosis with diffuse goiter without thyrotoxic crisis or storm: Secondary | ICD-10-CM | POA: Diagnosis not present

## 2022-04-10 DIAGNOSIS — Z0289 Encounter for other administrative examinations: Secondary | ICD-10-CM | POA: Diagnosis not present

## 2022-04-10 DIAGNOSIS — E041 Nontoxic single thyroid nodule: Secondary | ICD-10-CM | POA: Diagnosis not present

## 2022-04-10 DIAGNOSIS — K5900 Constipation, unspecified: Secondary | ICD-10-CM | POA: Diagnosis not present

## 2022-04-16 ENCOUNTER — Other Ambulatory Visit: Payer: Self-pay | Admitting: Internal Medicine

## 2022-04-17 DIAGNOSIS — H401131 Primary open-angle glaucoma, bilateral, mild stage: Secondary | ICD-10-CM | POA: Diagnosis not present

## 2022-04-17 DIAGNOSIS — H04123 Dry eye syndrome of bilateral lacrimal glands: Secondary | ICD-10-CM | POA: Diagnosis not present

## 2022-04-17 DIAGNOSIS — H43813 Vitreous degeneration, bilateral: Secondary | ICD-10-CM | POA: Diagnosis not present

## 2022-05-07 IMAGING — US US THYROID
1 series · 13 of 25 positions shown · non-contrast
Comparison: 05/03/2019

07/23/2015

CLINICAL DATA: Multinodular goiter

EXAM:
THYROID ULTRASOUND
TECHNIQUE: Ultrasound examination of the thyroid gland and adjacent soft
tissues was performed.

[Series 1: us thyroid · 0.06mm/px · 13 of 50 slices shown]
[im 1/50]
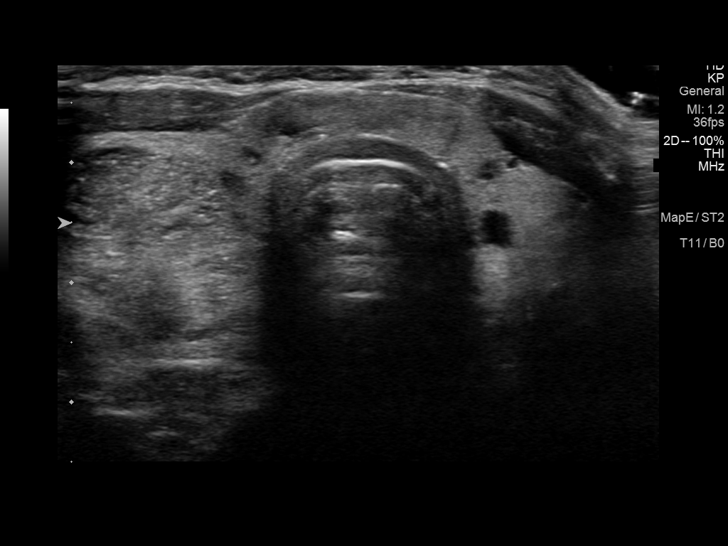
[im 5/50]
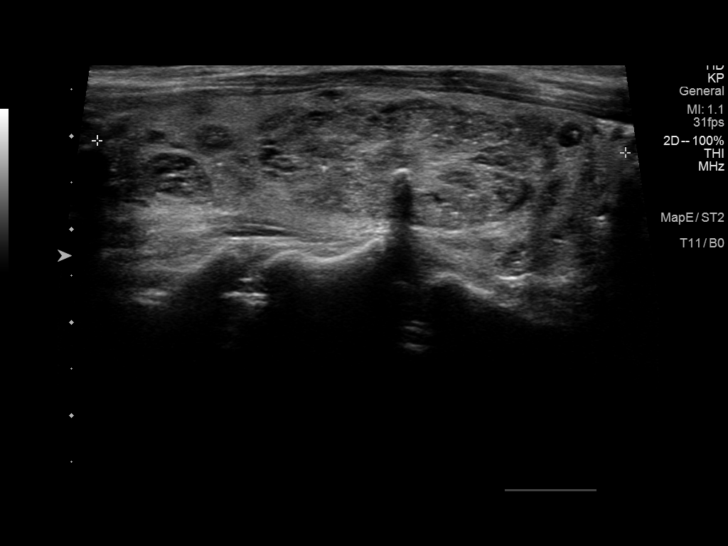
[im 9/50]
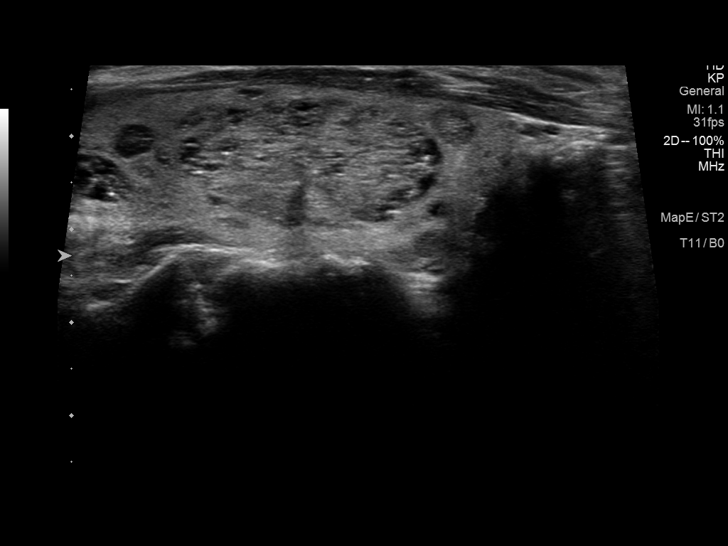
[im 13/50]
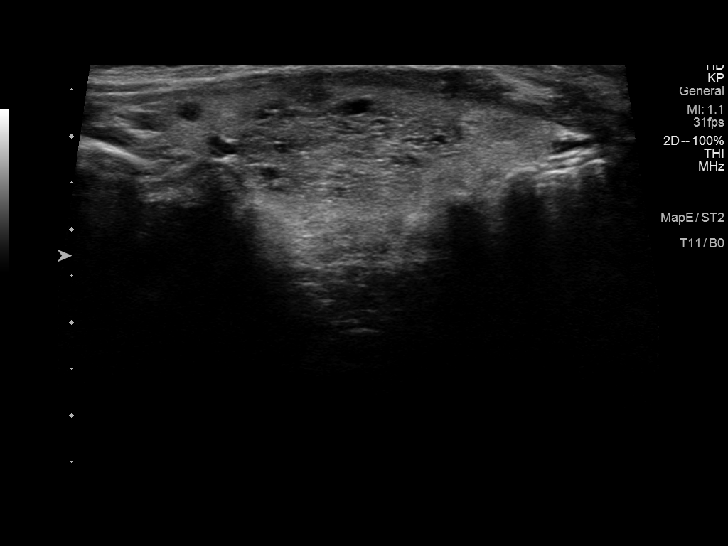
[im 17/50]
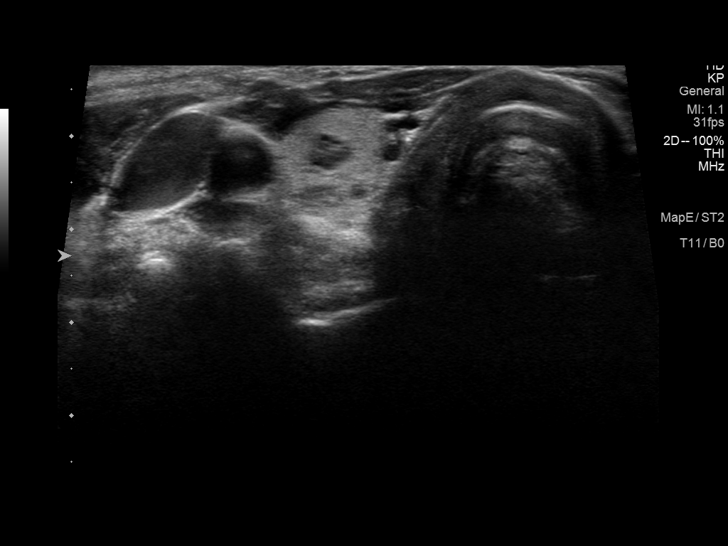
[im 21/50]
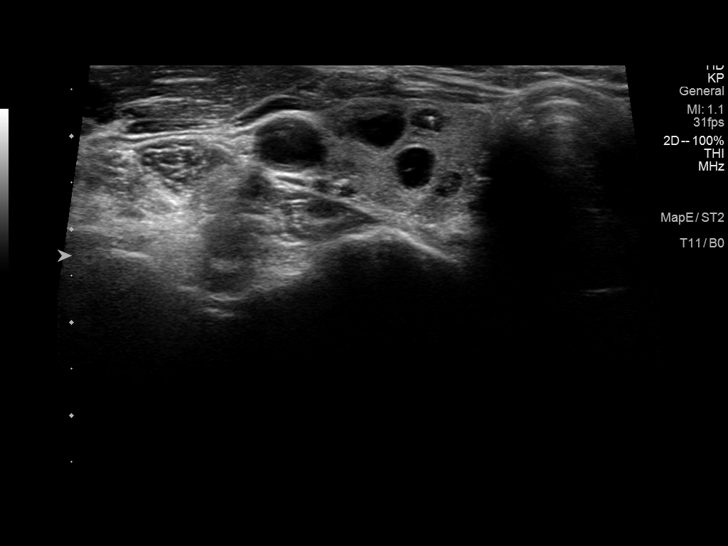
[im 25/50]
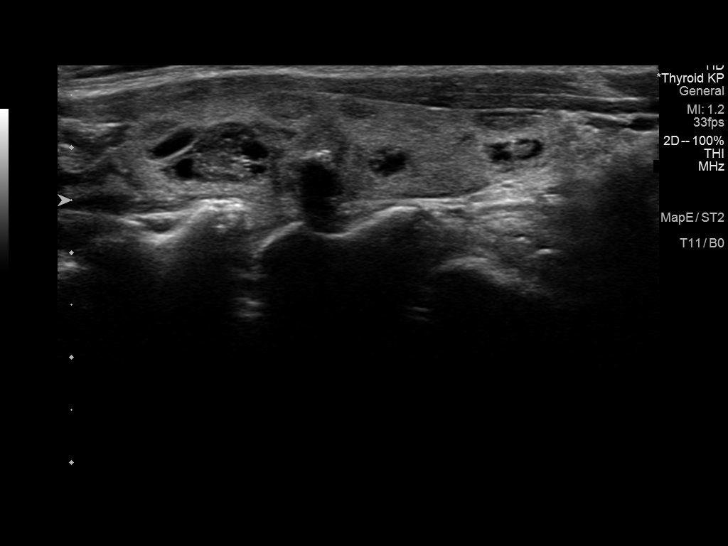
[im 29/50]
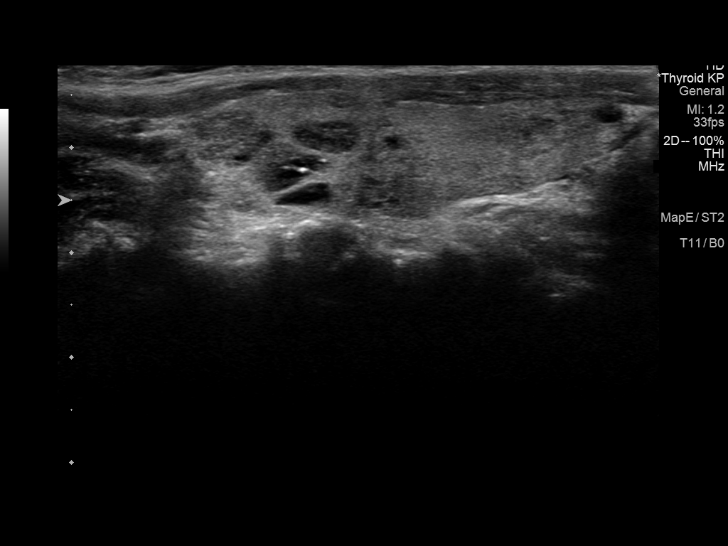
[im 33/50]
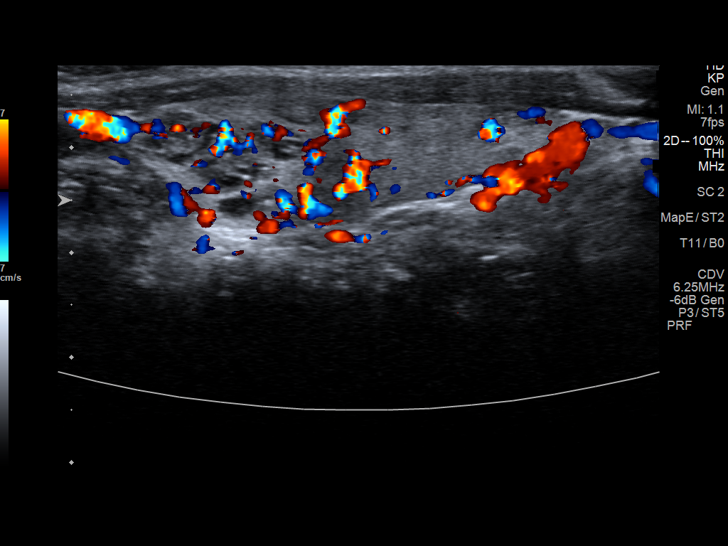
[im 37/50]
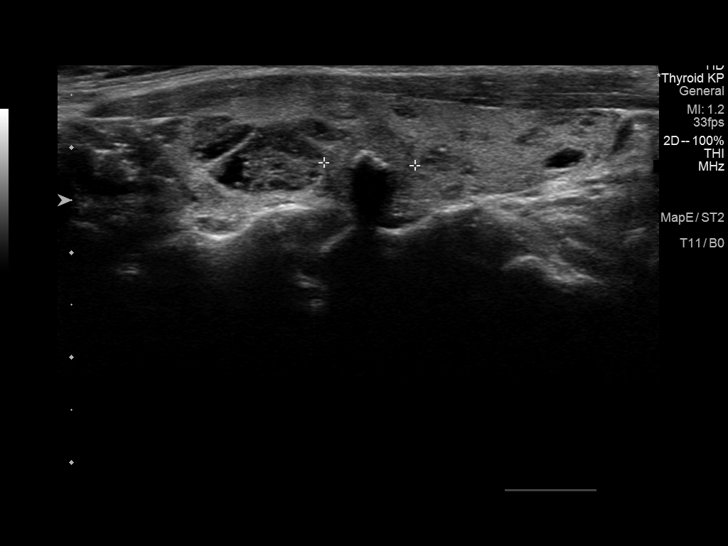
[im 41/50]
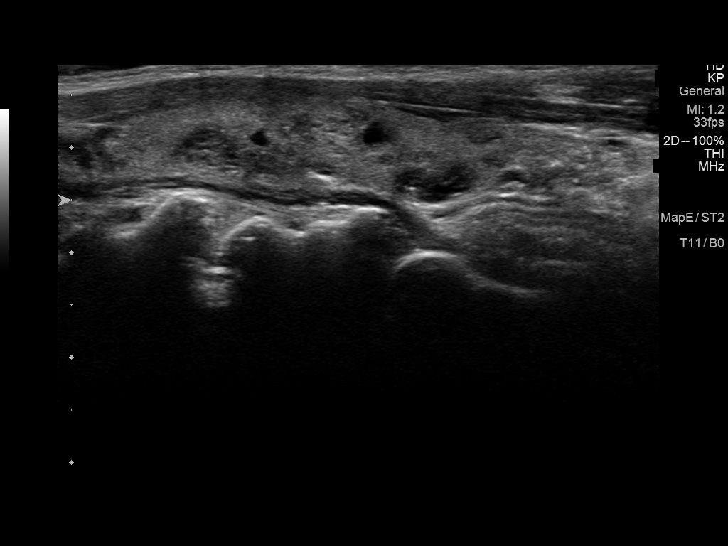
[im 45/50]
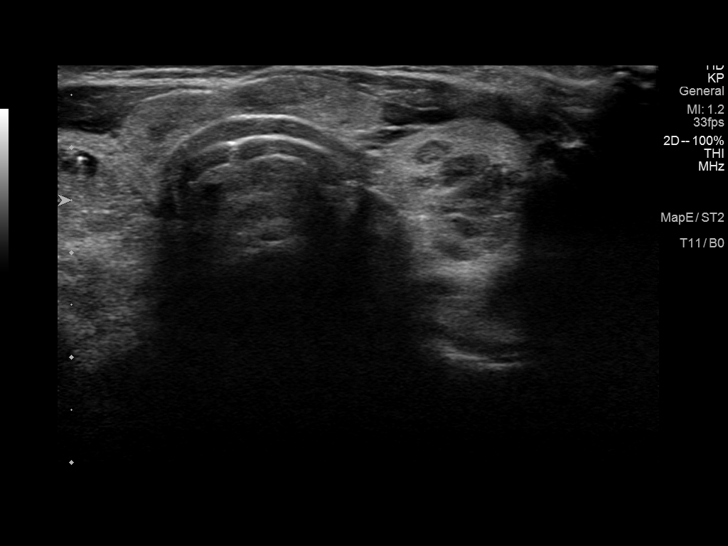
[im 50/50]
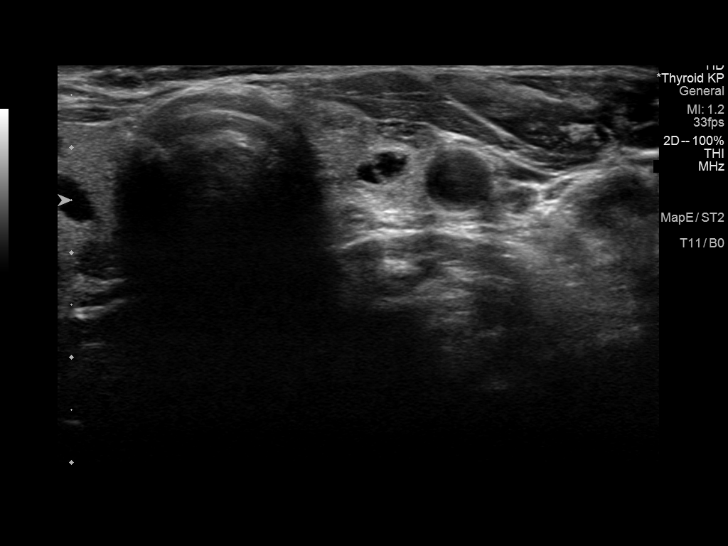

[13 of 25 positions shown; findings below may reference images not displayed]

FINDINGS: Parenchymal Echotexture: Moderately heterogeneous

Isthmus: 0.4 cm

Right lobe: 5.7 x 2.2 x 2.5 cm

Left lobe: 4.7 x 1.2 x 1.8 cm

_________________________________________________________

Estimated total number of nodules >/= 1 cm: 3

Number of spongiform nodules >/=  2 cm not described below (TR1): 0

Number of mixed cystic and solid nodules >/= 1.5 cm not described
below (TR2): 0

_________________________________________________________

Nodule # 1: Previously biopsied right mid thyroid nodule measuring
3.0 x 1.5 x 2.1 cm is not significantly changed in size since
07/23/2015 where it measured 2.9 x 1.8 x 1.4 cm. Please correlate
with FNA results.

_________________________________________________________

Nodule 2: 1.0 cm spongiform nodule in the superior left thyroid lobe
does not meet criteria for FNA or surveillance.

_________________________________________________________

Nodule 3: 1.0 x 0.8 x 0.9 cm solid hypoechoic nodule in the mid left
thyroid lobe is not significantly changed in size since 07/23/2015
where it measured 0.9 x 0.8 x 0.7 cm. Stability greater than 5 years
indicates benign etiology.
IMPRESSION: 1. Previously biopsied right thyroid nodule does not demonstrate
significant growth since 7910. Please correlate with FNA results.
[DATE] x 0.8 x 0.9 cm solid hypoechoic nodule in the left mid
thyroid lobe does not demonstrate significant growth since 7910
which indicates a benign etiology.

The above is in keeping with the ACR TI-RADS recommendations - [HOSPITAL] 7910;[DATE].

## 2022-05-08 DIAGNOSIS — K5900 Constipation, unspecified: Secondary | ICD-10-CM | POA: Diagnosis not present

## 2022-05-08 DIAGNOSIS — E209 Hypoparathyroidism, unspecified: Secondary | ICD-10-CM | POA: Diagnosis not present

## 2022-05-08 DIAGNOSIS — I700 Atherosclerosis of aorta: Secondary | ICD-10-CM | POA: Diagnosis not present

## 2022-05-08 DIAGNOSIS — I4719 Other supraventricular tachycardia: Secondary | ICD-10-CM | POA: Diagnosis not present

## 2022-05-08 DIAGNOSIS — Z0289 Encounter for other administrative examinations: Secondary | ICD-10-CM | POA: Diagnosis not present

## 2022-05-20 DIAGNOSIS — H43813 Vitreous degeneration, bilateral: Secondary | ICD-10-CM | POA: Diagnosis not present

## 2022-05-20 DIAGNOSIS — H401131 Primary open-angle glaucoma, bilateral, mild stage: Secondary | ICD-10-CM | POA: Diagnosis not present

## 2022-05-20 DIAGNOSIS — H109 Unspecified conjunctivitis: Secondary | ICD-10-CM | POA: Diagnosis not present

## 2022-05-20 DIAGNOSIS — H04123 Dry eye syndrome of bilateral lacrimal glands: Secondary | ICD-10-CM | POA: Diagnosis not present

## 2022-05-20 NOTE — Progress Notes (Unsigned)
Name: Victoria Cardenas  MRN/ DOB: [id number], 1950/11/12    Age/ Sex: 72 y.o., female     PCP: Thomes Dinning, MD   Reason for Endocrinology Evaluation: Subclinical hyperthyroidism     Initial Endocrinology Clinic Visit: 08/25/2018    PATIENT IDENTIFIER: Victoria Cardenas is a 72 y.o., female with a past medical history of HTN, Hx of breast cancer (S/P lumpectomy ) and Osteoporosis   . She has followed with [facility] since 08/25/2018 for consultative assistance with management of her Subclinical hyperthyroidism.   HISTORICAL SUMMARY:   Pt was noted with low TSH in 11/2016, at the time she presented to the ED for palpitations.    In April, 2020 she presented again with palpitations and sob and was found to have a TSH of 0.056 uIU/mL with normal T3 and T4. This was attributed to Graves' Disease due to elevated TRAb level of 3.34 IU/L.   - She is S/P benign FNA of the right mid nodule in 2018 , other sub-centimeter nodules present on ultrasound bilaterally but did not require further follow up.  -Repeat thyroid ultrasound in 04/2019 showed stable right mid nodule at 2.9 cm    No FH of thyroid disease.    Osteoporosis : She was diagnosed with osteoporosis 2017, and was started on boniva at the time, but pt stopped it due to palpitations and diarrhea ~ 2 yrs ago.  Currently she is only on Calcium and Vitamin D 2 tablets daily  She declines antiresorptive therapy      SUBJECTIVE:    Today (05/20/2022):  Victoria Cardenas is here for a  4 months follow up visit on subclinical hyperthyroidism secondary to Graves' Disease.    Denies vomiting or abdominal pain  Denies local neck swelling  Rare palpitations, last one was last week Denies tremors   She developed nausea and diarrhea due to Alendronate   Medications: Methimazole 2.5 mg daily  Alendronate 70 mg weekly  MVI   HISTORY:  Past Medical History:  Past Medical History:  Diagnosis Date   Breast cancer ([place])  2005   right breast   Dizziness    Glaucoma    Hypercholesteremia    Hypertension    Medication intolerance    multiple   Mild mitral regurgitation    NSVT (nonsustained ventricular tachycardia)    Paresthesia    Shortened PR interval    SVT (supraventricular tachycardia) ([place])    Thyroid disease    Past Surgical History:  Past Surgical History:  Procedure Laterality Date   ABDOMINAL HYSTERECTOMY     BREAST EXCISIONAL BIOPSY Left 10/10/2015   benign   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 She has never used smokeless tobacco. She reports that she does not drink alcohol and does not use drugs. Family History:  Family History  Problem Relation Age of Onset   Hypertension Mother    Breast cancer Mother 2   Heart attack Sister    Breast cancer Maternal Grandmother    Breast cancer Maternal Aunt      HOME MEDICATIONS: Allergies as of 05/21/2022       Reactions   Amlodipine Swelling, Palpitations   Swelling, heart pounding   Penicillins Hives   Has patient had a PCN reaction causing immediate rash, facial/tongue/throat swelling, SOB or lightheadedness with hypotension: yes Has patient had a PCN reaction causing severe rash involving mucus membranes or skin necrosis: no  Has patient had a PCN reaction that required hospitalization: no Has patient had a PCN reaction occurring within the last 10 years: no If all of the above answers are "NO", then may proceed with Cephalosporin   Cardizem Cd [diltiazem] Other (See Comments)   Pt reports this med causes her fatigue, dizziness, lightheaded, sore throat, and hard swallowing meals.   Lisinopril    Patient refused prior rx due to potential side effects outlined in medication information   Losartan Other (See Comments)   Pt reports causes frequent urination and UTIs   Propranolol Other (See Comments)   Pt reports causes flushing    Rosuvastatin Other (See Comments)   Pt  reports caused her to feel nauseated, dizzy, and lightheaded.    Ciprofloxacin Palpitations   Simvastatin Other (See Comments)   Increased urination        Medication List        Accurate as of May 20, 2022 11:01 AM. If you have any questions, ask your nurse or doctor.          alendronate 70 MG tablet Commonly known as: FOSAMAX Take 1 tablet (70 mg total) by mouth once a week. Take with a full glass of water on an empty stomach.   cholecalciferol 25 MCG (1000 UNIT) tablet Commonly known as: VITAMIN D3 Take 1,000 Units by mouth daily.   ezetimibe 10 MG tablet Commonly known as: ZETIA Take 1 tablet (10 mg total) by mouth daily.   Lumigan 0.01 % Soln Generic drug: bimatoprost Place 1 drop into both eyes at bedtime.   methimazole 5 MG tablet Commonly known as: TAPAZOLE Take 0.5 tablets (2.5 mg total) by mouth daily.          OBJECTIVE:   PHYSICAL EXAM: VS:BP (!) 150/94 (BP Location: Left Arm, Patient Position: Sitting, Cuff Size: Normal)   Pulse 92   Ht '5\' 3"'$  (1.6 m)   Wt 120 lb 6.4 oz (54.6 kg)   SpO2 99%   BMI 21.33 kg/m     EXAM: General: Pt appears well and is in NAD  Neck: General: Supple without adenopathy. Thyroid: Thyroid size normal.  No goiter or nodules appreciated.  Lungs: Clear with good BS bilat with no rales, rhonchi, or wheezes  Heart: Auscultation: RRR.  Abdomen: Normoactive bowel sounds, soft, nontender, without masses or organomegaly palpable  Extremities:  BL LE: No pretibial edema normal ROM and strength.  Mental Status: Judgment, insight: Intact Orientation: Oriented to time, place, and person Mood and affect: No depression, anxiety, or agitation     DATA REVIEWED:  Latest Reference Range & Units 05/21/22 12:01  Sodium 135 - 145 mEq/L 139  Potassium 3.5 - 5.1 mEq/L 3.9  Chloride 96 - 112 mEq/L 104  CO2 19 - 32 mEq/L 29  Glucose 70 - 99 mg/dL 101 (H)  BUN 6 - 23 mg/dL 13  Creatinine 0.40 - 1.20 mg/dL 0.71  Calcium  8.4 - 10.5 mg/dL 9.2  GFR >60.00 mL/min 84.67    Latest Reference Range & Units 05/21/22 12:01  TSH 0.35 - 5.50 uIU/mL 1.39  T4,Free(Direct) 0.60 - 1.60 ng/dL 0.73    Results for Victoria Cardenas (MRN [id number]) as of 10/20/2018 09:48  Ref. Range 09/10/2018 11:13  TRAB Latest Ref Range: <=2.00 IU/L 3.34 (H)   Thyroid Ultrasound 12/19/2020  Estimated total number of nodules >/= 1 cm: 3   Number of spongiform nodules >/=  2 cm not described below (TR1): 0   Number of mixed  cystic and solid nodules >/= 1.5 cm not described below ([place]): 0   _________________________________________________________   Nodule # 1: Previously biopsied right mid thyroid nodule measuring 3.0 x 1.5 x 2.1 cm is not significantly changed in size since 07/23/2015 where it measured 2.9 x 1.8 x 1.4 cm. Please correlate with FNA results.   _________________________________________________________   Nodule 2: 1.0 cm spongiform nodule in the superior left thyroid lobe does not meet criteria for FNA or surveillance.   _________________________________________________________   Nodule 3: 1.0 x 0.8 x 0.9 cm solid hypoechoic nodule in the mid left thyroid lobe is not significantly changed in size since 07/23/2015 where it measured 0.9 x 0.8 x 0.7 cm. Stability greater than 5 years indicates benign etiology.   IMPRESSION: 1. Previously biopsied right thyroid nodule does not demonstrate significant growth since 2017. Please correlate with FNA results. 2. 1.0 x 0.8 x 0.9 cm solid hypoechoic nodule in the left mid thyroid lobe does not demonstrate significant growth since 2017 which indicates a benign etiology.       FNA RMP nodule 2.9x1.4x1.8 (11/27/2015)   CONSISTENT WITH BENIGN FOLLICULAR NODULE (BETHESDA CATEGORY II).    DXA 10/31/2021   Narrative & Impression   ASSESSMENT: The BMD measured at Femur Neck Left is 0.583 g/cm2 with a T-score of -3.3. This patient is considered osteoporotic according  to [facility]) criteria.    Site Region Measured Date Measured Age YA BMD Significant CHANGE T-score DualFemur Neck Left  10/31/2020    71.3         -3.3    0.583 g/cm2 DualFemur Neck Left  12/22/2017    68.4         -3.3    0.582 g/cm2   AP Spine  L1-L4      10/31/2021    [age]         -3.1    0.806 g/cm2 AP Spine  L1-L4      12/22/2017    67.4         -3.1    0.815 g/cm2   DualFemur Total Mean 10/31/2021    72.3         -2.7    0.664 g/cm2 DualFemur Total Mean 12/22/2017    68.4         -2.7    0.662 g/cm2      ASSESSMENT / PLAN / RECOMMENDATIONS:   Subclinical Hyperthyroidism    - This is partly due to Graves' Disease but toxic thyroid nodule can not be excluded - Patient is clinically euthyroid -TFTs normal   Medications  Continue Methimazole 2.5 mg daily     2. Multinodular Goiter    - No local neck symptoms  - She is S/P benign FNA of the right mid nodule in 2017 -Repeat thyroid ultrasound in 11/2020 showed stable right mid nodule at 2.9 cm - Will repeat this year  3. Graves' Disease:  - No evidence of extra-thyroidal manifestations of graves' disease  4. Osteoporosis :  - I had started her on Alendronate but after taking one dose she endorses nausea and diarrhea  - Today she was given the option of giving alendronate another try vs proceeding with annual Zoledronic acid   Pt will remain on MVI at this time     F/u in 6 month     Signed electronically by: Mack Guise, MD  [facility]  [facility] [address], [place], [place] [address] Phone:  [phone] FAX: [phone]      CC: Thomes Dinning, [place] [address] Phone: [phone]  Fax: [phone]   Return to Endocrinology clinic as below: Future Appointments  Date Time Provider [place]  05/21/2022 12:10 PM Kairon Shock, Melanie Crazier, MD [id number] None  06/10/2022   9:00 AM Freada Bergeron, MD [facility]

## 2022-05-21 ENCOUNTER — Encounter: Payer: Self-pay | Admitting: Internal Medicine

## 2022-05-21 ENCOUNTER — Ambulatory Visit (INDEPENDENT_AMBULATORY_CARE_PROVIDER_SITE_OTHER): Payer: 59 | Admitting: Internal Medicine

## 2022-05-21 VITALS — BP 150/94 | HR 92 | Ht 63.0 in | Wt 120.4 lb

## 2022-05-21 DIAGNOSIS — M810 Age-related osteoporosis without current pathological fracture: Secondary | ICD-10-CM | POA: Diagnosis not present

## 2022-05-21 DIAGNOSIS — E042 Nontoxic multinodular goiter: Secondary | ICD-10-CM

## 2022-05-21 DIAGNOSIS — E0500 Thyrotoxicosis with diffuse goiter without thyrotoxic crisis or storm: Secondary | ICD-10-CM

## 2022-05-21 DIAGNOSIS — E0590 Thyrotoxicosis, unspecified without thyrotoxic crisis or storm: Secondary | ICD-10-CM

## 2022-05-21 LAB — BASIC METABOLIC PANEL
BUN: 13 mg/dL (ref 6–23)
CO2: 29 mEq/L (ref 19–32)
Calcium: 9.2 mg/dL (ref 8.4–10.5)
Chloride: 104 mEq/L (ref 96–112)
Creatinine, Ser: 0.71 mg/dL (ref 0.40–1.20)
GFR: 84.67 mL/min (ref >60.00)
Glucose, Bld: 101 mg/dL — ABNORMAL HIGH (ref 70–99)
Potassium: 3.9 mEq/L (ref 3.5–5.1)
Sodium: 139 mEq/L (ref 135–145)

## 2022-05-21 LAB — TSH: TSH: 1.39 u[IU]/mL (ref 0.35–5.50)

## 2022-05-21 LAB — T4, FREE: Free T4: 0.73 ng/dL (ref 0.60–1.60)

## 2022-05-22 MED ORDER — METHIMAZOLE 5 MG PO TABS
2.5000 mg | ORAL_TABLET | Freq: Every day | ORAL | 3 refills | Status: DC
Start: 2022-05-22 — End: 2022-11-27

## 2022-05-23 ENCOUNTER — Other Ambulatory Visit: Payer: Self-pay | Admitting: *Deleted

## 2022-05-23 DIAGNOSIS — Z1231 Encounter for screening mammogram for malignant neoplasm of breast: Secondary | ICD-10-CM

## 2022-06-04 DIAGNOSIS — K30 Functional dyspepsia: Secondary | ICD-10-CM | POA: Diagnosis not present

## 2022-06-04 DIAGNOSIS — E7800 Pure hypercholesterolemia, unspecified: Secondary | ICD-10-CM | POA: Diagnosis not present

## 2022-06-04 DIAGNOSIS — R7303 Prediabetes: Secondary | ICD-10-CM | POA: Diagnosis not present

## 2022-06-04 DIAGNOSIS — Z0289 Encounter for other administrative examinations: Secondary | ICD-10-CM | POA: Diagnosis not present

## 2022-06-04 DIAGNOSIS — E041 Nontoxic single thyroid nodule: Secondary | ICD-10-CM | POA: Diagnosis not present

## 2022-06-04 DIAGNOSIS — I10 Essential (primary) hypertension: Secondary | ICD-10-CM | POA: Diagnosis not present

## 2022-06-07 NOTE — Progress Notes (Unsigned)
Cardiology Office Note:    Date:  06/10/2022   ID:  Victoria Cardenas, DOB [birth year], MRN [id number]  PCP:  Loretha Brasil, FNP   [facility] Providers Cardiologist:  None {  Referring MD: Thomes Dinning, MD     History of Present Illness:    Victoria Cardenas is a 72 y.o. female with a hx of HTN, palpitations/SVT and Graves' disease who was previously followed by Dr. [name] who now returns to clinic for follow-up of her SVT and HTN.  Of note, patient has been maintained off all HTN medications due to multiple medication intolerances.  Had ER visit in 12/2020 for chest pain. Coronary CTA with no significant CAD, Ca score 0.  Was last seen in clinic by Richardson Dopp in 04/2021 where she was doing well. Declined PCSK9i and opted to continue with lifestyle modifications.  Today, the patient overall feels well today. No chest pain, SOB, orthopnea, PND or LE edema. Rare palpitations but not overly bothersome. Blood pressure fluctuate at home 110-150s. Remains active and feels well with no anginal symptoms. She has declined further medications for blood pressure at this time due to numerous intolerances.    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   LUMIGAN 0.01 % SOLN Place 1 drop  into both eyes at bedtime.   methimazole (TAPAZOLE) 5 MG tablet Take 0.5 tablets (2.5 mg total) by mouth daily.   Multiple Minerals-Vitamins (CALCIUM-MAGNESIUM-ZINC-D3) TABS Take by mouth. 1 daily     Allergies:   Amlodipine, Penicillins, Alendronate, Cardizem cd [diltiazem], Lisinopril, Losartan, Propranolol, Rosuvastatin, Ciprofloxacin, and Simvastatin   Social History   Socioeconomic History   Marital status: Legally Separated    Spouse name: Not on file   Number of children: Not on file   Years of education: Not on file   Highest education level: Not on file  Occupational History   Not on file  Tobacco Use   Smoking status: Never   Smokeless tobacco: Never  Vaping Use   Vaping Use: Never used  Substance and Sexual Activity   Alcohol use: No   Drug use: No   Sexual activity: Not on file  Other Topics Concern   Not on file  Social History Narrative   Lives at home alone    2 children    Does not use caffeine products      MB RN 11/05/20   Social Determinants of Health   Financial Resource Strain: Not on file  Food Insecurity: Not on file  Transportation Needs: Not on file  Physical Activity: Not on file  Stress: Not on file  Social Connections: Not on file  Family History: The patient's family history includes Breast cancer in her maternal aunt and maternal grandmother; Breast cancer (age of onset: 74) in her mother; Heart attack in her sister; Hypertension in her mother.  ROS:   Review of Systems  Constitutional:  Negative for fever and weight loss.  HENT:  Negative for congestion and tinnitus.   Eyes:  Negative for photophobia and discharge.  Respiratory:  Negative for cough and hemoptysis.   Cardiovascular:  Negative for chest pain, palpitations, orthopnea, claudication, leg swelling and PND.  Gastrointestinal:  Negative for heartburn, nausea and vomiting.  Genitourinary:  Negative for frequency and urgency.  Musculoskeletal:  Negative for myalgias and  neck pain.  Neurological:  Negative for tremors, seizures and headaches.  Endo/Heme/Allergies:  Does not bruise/bleed easily.  Psychiatric/Behavioral:  Negative for depression and memory loss.      EKGs/Labs/Other Studies Reviewed:    The following studies were reviewed today: Coronary CTA 12/2020: FINDINGS: Non-cardiac: See separate report from [facility].   The pulmonary veins drain normally to the left atrium. No LA appendage thrombus.   Calcium Score: 0 Agatston units.   Coronary Arteries: Right dominant with no anomalies   LM: No plaque or stenosis.   LAD system: No plaque or stenosis, small caliber distal LAD.   Circumflex system: No plaque or stenosis.   RCA system: No plaque or stenosis.   IMPRESSION: 1. Coronary calcium score 0 Agatston units, suggesting low risk for future cardiac events.  ECHO IMPRESSIONS 09/2018    1. The left ventricle has normal systolic function with an ejection  fraction of 60-65%. The cavity size was normal. Left ventricular diastolic  parameters were normal. No evidence of left ventricular regional wall  motion abnormalities.   2. The right ventricle has normal systolic function. The cavity was  normal. There is no increase in right ventricular wall thickness. Right  ventricular systolic pressure could not be assessed.   3. Mild thickening of the mitral valve leaflet. The MR jet is  centrally-directed.   4. Mild thickening of the aortic valve. No stenosis of the aortic valve.  Cardiac Monitor 09/09/18:  Sinus tach cardia to sinus tachycardia. 1 episode of ventricular tachycardia for total of 9 beats with ventricular rate 170 bpm. 43 episodes of SVTs some of which seem to represent atrial fibrillation.   Sinus tach cardia to sinus tachycardia. 1 episode of ventricular tachycardia for total of 9 beats with ventricular rate 170 bpm. 43 episodes of SVTs some of which seem to represent atrial fibrillation.  EKG:   06/10/22:  NSR with HR 62   Recent Labs: 05/21/2022: BUN 13; Creatinine, Ser 0.71; Potassium 3.9; Sodium 139; TSH 1.39  Recent Lipid Panel    Component Value Date/Time   CHOL 188 05/07/2021 1046   TRIG 83 05/07/2021 1046   HDL 42 05/07/2021 1046   CHOLHDL 4.5 (H) 05/07/2021 1046   LDLCALC 131 (H) 05/07/2021 1046     Risk Assessment/Calculations:       Physical Exam:    VS:  BP 136/89   Pulse 62   Ht 5' 3"$  (1.6 m)   Wt 120 lb 2 oz (54.5 kg)   SpO2 98%   BMI 21.28 kg/m     Wt Readings from Last 3 Encounters:  06/10/22 120 lb 2 oz (54.5 kg)  05/21/22 120 lb 6.4 oz (54.6 kg)  05/20/21 119 lb (54 kg)     GEN: Well nourished, well developed in no acute distress HEENT: Normal NECK:  No JVD; No carotid bruits CARDIAC: RRR, no murmurs, rubs, gallops RESPIRATORY:  Clear to auscultation without rales, wheezing or rhonchi  ABDOMEN: Soft, non-tender, non-distended MUSCULOSKELETAL:  No edema; No deformity  SKIN: Warm and dry NEUROLOGIC:  Alert and oriented x 3 PSYCHIATRIC:  Normal affect   ASSESSMENT:    1. Essential hypertension   2. SVT (supraventricular tachycardia)   3. Graves disease   4. Hyperlipidemia, unspecified hyperlipidemia type     PLAN:    In order of problems listed above:  #Hypertension: Fluctuates 110-150s at home. Has trialed multiple medications but has significant side-effects. Wishes to continue with lifestyle and salt restriction for now.  -Continue low Na diet -Not on antihypertensives for now; monitoring off medications and overall fairly well controlled  #Graves Disease: Controlled with normal TSH free T3 and T4 in 04/2022. -Follow-up with PCP as scheduled  #History of SVT: TTE in 2020 with LVEF 60-65% and no significant valve disease. Symptoms significantly improved. -Continue to monitor  #HLD: Intolerant of statins and zetia, Declined PCSK9i. CCTA without significant CAD and Ca score 0. Will continue with lifestyle modifications. -Intolerant  of statins and zetia -Declined PCSK9i -Continue lifestyle modifications -Check lipids today for monitoring   Follow-up in 1 year  Medication Adjustments/Labs and Tests Ordered: Current medicines are reviewed at length with the patient today.  Concerns regarding medicines are outlined above.  Orders Placed This Encounter  Procedures   Lipid panel   EKG 12-Lead   No orders of the defined types were placed in this encounter.   Patient Instructions  Medication Instructions:  Your physician recommends that you continue on your current medications as directed. Please refer to the Current Medication list given to you today.  *If you need a refill on your cardiac medications before your next appointment, please call your pharmacy*   Lab Work: Lipids today  If you have labs (blood work) drawn today and your tests are completely normal, you will receive your results only by: [place] (if you have MyChart) OR A paper copy in the mail If you have any lab test that is abnormal or we need to change your treatment, we will call you to review the results.  Follow-Up: At [facility], you and your health needs are our priority.  As part of our continuing mission to provide you with exceptional heart care, we have created designated Provider Care Teams.  These Care Teams include your primary Cardiologist (physician) and Advanced Practice Providers (APPs -  Physician Assistants and Nurse Practitioners) who all work together to provide you with the care you need, when you need it.     Your next appointment:   1 year(s)  Provider:   Dr Johney Frame      Signed, Freada Bergeron, MD  06/10/2022 9:27 AM    [place]

## 2022-06-10 ENCOUNTER — Ambulatory Visit: Payer: 59 | Attending: Cardiology | Admitting: Cardiology

## 2022-06-10 ENCOUNTER — Encounter: Payer: Self-pay | Admitting: Cardiology

## 2022-06-10 VITALS — BP 136/89 | HR 62 | Ht 63.0 in | Wt 120.1 lb

## 2022-06-10 DIAGNOSIS — I471 Supraventricular tachycardia, unspecified: Secondary | ICD-10-CM

## 2022-06-10 DIAGNOSIS — E785 Hyperlipidemia, unspecified: Secondary | ICD-10-CM

## 2022-06-10 DIAGNOSIS — I10 Essential (primary) hypertension: Secondary | ICD-10-CM | POA: Diagnosis not present

## 2022-06-10 DIAGNOSIS — E0500 Thyrotoxicosis with diffuse goiter without thyrotoxic crisis or storm: Secondary | ICD-10-CM | POA: Diagnosis not present

## 2022-06-10 LAB — LIPID PANEL
Chol/HDL Ratio: 4.5 ratio — ABNORMAL HIGH (ref 0.0–4.4)
Cholesterol, Total: 196 mg/dL (ref 100–199)
HDL: 44 mg/dL (ref >39)
LDL Chol Calc (NIH): 128 mg/dL — ABNORMAL HIGH (ref 0–99)
Triglycerides: 133 mg/dL (ref 0–149)
VLDL Cholesterol Cal: 24 mg/dL (ref 5–40)

## 2022-06-10 NOTE — Patient Instructions (Signed)
Medication Instructions:  Your physician recommends that you continue on your current medications as directed. Please refer to the Current Medication list given to you today.  *If you need a refill on your cardiac medications before your next appointment, please call your pharmacy*   Lab Work: Lipids today  If you have labs (blood work) drawn today and your tests are completely normal, you will receive your results only by: [place] (if you have MyChart) OR A paper copy in the mail If you have any lab test that is abnormal or we need to change your treatment, we will call you to review the results.  Follow-Up: At [facility], you and your health needs are our priority.  As part of our continuing mission to provide you with exceptional heart care, we have created designated Provider Care Teams.  These Care Teams include your primary Cardiologist (physician) and Advanced Practice Providers (APPs -  Physician Assistants and Nurse Practitioners) who all work together to provide you with the care you need, when you need it.     Your next appointment:   1 year(s)  Provider:   Dr Johney Frame

## 2022-06-11 ENCOUNTER — Ambulatory Visit
Admission: RE | Admit: 2022-06-11 | Discharge: 2022-06-11 | Disposition: A | Discharge status: Home / Self Care | Payer: 59 | Source: Ambulatory Visit | Attending: Internal Medicine | Admitting: Internal Medicine

## 2022-06-11 DIAGNOSIS — E042 Nontoxic multinodular goiter: Secondary | ICD-10-CM

## 2022-06-13 ENCOUNTER — Telehealth: Payer: Self-pay | Admitting: Internal Medicine

## 2022-06-13 ENCOUNTER — Telehealth: Payer: Self-pay

## 2022-06-13 NOTE — Telephone Encounter (Signed)
Patient calling regarding [place] result

## 2022-06-13 NOTE — Telephone Encounter (Signed)
I have reviewed thyroid ultrasound images on 06/13/2022 at 1600    Previously biopsied right thyroid nodule 11/2020 measured 2 x 2.5 x 3 cm   Nodule measurements on 05/11/2022 measured 1.75 x 2 x 3.3 cm   There is an increase in 1 dimension, and decrease in the other 2 dimensions.   We will continue to monitor

## 2022-06-17 NOTE — Telephone Encounter (Signed)
Patient advise on results and verbalized understanding

## 2022-07-08 DIAGNOSIS — Z853 Personal history of malignant neoplasm of breast: Secondary | ICD-10-CM | POA: Diagnosis not present

## 2022-07-08 DIAGNOSIS — K30 Functional dyspepsia: Secondary | ICD-10-CM | POA: Diagnosis not present

## 2022-07-08 DIAGNOSIS — R7303 Prediabetes: Secondary | ICD-10-CM | POA: Diagnosis not present

## 2022-07-08 DIAGNOSIS — Z0289 Encounter for other administrative examinations: Secondary | ICD-10-CM | POA: Diagnosis not present

## 2022-07-08 DIAGNOSIS — Z9889 Other specified postprocedural states: Secondary | ICD-10-CM | POA: Diagnosis not present

## 2022-07-09 ENCOUNTER — Ambulatory Visit
Admission: RE | Admit: 2022-07-09 | Discharge: 2022-07-09 | Disposition: A | Discharge status: Home / Self Care | Payer: 59 | Source: Ambulatory Visit | Attending: *Deleted | Admitting: *Deleted

## 2022-07-09 DIAGNOSIS — Z1231 Encounter for screening mammogram for malignant neoplasm of breast: Secondary | ICD-10-CM | POA: Diagnosis not present

## 2022-07-14 DIAGNOSIS — K219 Gastro-esophageal reflux disease without esophagitis: Secondary | ICD-10-CM | POA: Diagnosis not present

## 2022-07-14 DIAGNOSIS — K30 Functional dyspepsia: Secondary | ICD-10-CM | POA: Diagnosis not present

## 2022-07-29 DIAGNOSIS — I10 Essential (primary) hypertension: Secondary | ICD-10-CM | POA: Diagnosis not present

## 2022-07-29 DIAGNOSIS — Z0289 Encounter for other administrative examinations: Secondary | ICD-10-CM | POA: Diagnosis not present

## 2022-07-29 DIAGNOSIS — K30 Functional dyspepsia: Secondary | ICD-10-CM | POA: Diagnosis not present

## 2022-07-29 DIAGNOSIS — E7800 Pure hypercholesterolemia, unspecified: Secondary | ICD-10-CM | POA: Diagnosis not present

## 2022-07-29 DIAGNOSIS — H6123 Impacted cerumen, bilateral: Secondary | ICD-10-CM | POA: Diagnosis not present

## 2022-07-29 DIAGNOSIS — H409 Unspecified glaucoma: Secondary | ICD-10-CM | POA: Diagnosis not present

## 2022-08-15 DIAGNOSIS — K30 Functional dyspepsia: Secondary | ICD-10-CM | POA: Diagnosis not present

## 2022-08-15 DIAGNOSIS — Z0289 Encounter for other administrative examinations: Secondary | ICD-10-CM | POA: Diagnosis not present

## 2022-08-15 DIAGNOSIS — M810 Age-related osteoporosis without current pathological fracture: Secondary | ICD-10-CM | POA: Diagnosis not present

## 2022-08-15 DIAGNOSIS — Z789 Other specified health status: Secondary | ICD-10-CM | POA: Diagnosis not present

## 2022-08-15 DIAGNOSIS — Z2821 Immunization not carried out because of patient refusal: Secondary | ICD-10-CM | POA: Diagnosis not present

## 2022-08-26 ENCOUNTER — Ambulatory Visit (INDEPENDENT_AMBULATORY_CARE_PROVIDER_SITE_OTHER): Payer: 59 | Admitting: Podiatry

## 2022-08-26 DIAGNOSIS — L600 Ingrowing nail: Secondary | ICD-10-CM | POA: Diagnosis not present

## 2022-08-26 NOTE — Progress Notes (Signed)
   Chief Complaint  Patient presents with   Ingrown Toenail    Patient came in today for ingrown toenail right hallux, Medial border, started month ago, rate of pain 5 out 10, no drainage swelling or redness, patient has tried to cut it and soaking,    Subjective: Patient presents today for evaluation of pain to the medial border right great toe. Patient is concerned for possible ingrown nail.  It is very sensitive to touch.  Patient presents today for further treatment and evaluation.  Past Medical History:  Diagnosis Date   Breast cancer ([facility]) 2005   right breast   Dizziness    Glaucoma    Hypercholesteremia    Hypertension    Medication intolerance    multiple   Mild mitral regurgitation    NSVT (nonsustained ventricular tachycardia) ([facility])    Paresthesia    Shortened PR interval    SVT (supraventricular tachycardia)    Thyroid disease     Objective:  General: Well developed, nourished, in no acute distress, alert and oriented x3   Dermatology: Skin is warm, dry and supple bilateral.  Medial border right great toe is tender with evidence of an ingrowing nail. Pain on palpation noted to the border of the nail fold. The remaining nails appear unremarkable at this time. There are no open sores, lesions.  Vascular: DP and PT pulses palpable.  No clinical evidence of vascular compromise  Neruologic: Grossly intact via light touch bilateral.  Musculoskeletal: No pedal deformity noted  Assesement: #1 Paronychia with ingrowing nail medial border right great toe  Plan of Care:  1. Patient evaluated.  2. Discussed treatment alternatives and plan of care. Explained nail avulsion procedure and post procedure course to patient.  Ultimately the patient declined nail avulsion procedure and would prefer simple debridement of the offending border of the nail plate. 3.  Mechanical debridement of the offending border of the nail plate was performed today using a nail nipper without  incident or bleeding.  The patient did feel some relief with this. 4.  Monitor over the next 2-3 weeks.  If she continues to have pain and tenderness return to the clinic for partial nail matricectomy of the medial border of the right great toe  Felecia Shelling, DPM [facility]  Dr. Felecia Shelling, DPM    [address] [place], [place] [address]                Office [phone]  Fax [phone]

## 2022-08-26 NOTE — Patient Instructions (Signed)

## 2022-09-17 ENCOUNTER — Ambulatory Visit (INDEPENDENT_AMBULATORY_CARE_PROVIDER_SITE_OTHER): Payer: 59 | Admitting: Podiatry

## 2022-09-17 DIAGNOSIS — L600 Ingrowing nail: Secondary | ICD-10-CM | POA: Diagnosis not present

## 2022-09-17 NOTE — Progress Notes (Signed)
   Chief Complaint  Patient presents with   Ingrown Toenail    Patient came in today for right hallux ingrown removal, patient denies any pain     Subjective: 72 y.o. female presents today status post permanent nail avulsion procedure of the lateral border of the right great toe that was performed on 08/26/2022.  Patient doing well.  She no longer has any pain or tenderness.  Overall she is very satisfied.   Past Medical History:  Diagnosis Date   Breast cancer (HCC) 2005   right breast   Dizziness    Glaucoma    Hypercholesteremia    Hypertension    Medication intolerance    multiple   Mild mitral regurgitation    NSVT (nonsustained ventricular tachycardia) (HCC)    Paresthesia    Shortened PR interval    SVT (supraventricular tachycardia)    Thyroid disease     Objective: Neurovascular status intact.  Skin is warm, dry and supple. Nail and respective nail fold appears to be healing appropriately.   Assessment: #1 s/p partial permanent nail matrixectomy lateral border right great toe   Plan of care: #1 patient was evaluated  #2 light debridement of the periungual debris was performed to the border of the respective toe and nail plate using a tissue nipper. #3 patient is to return to clinic on a PRN basis.   Felecia Shelling, DPM [facility]  Dr. Felecia Shelling, DPM    [address] [place], [place] [address]                Office [phone]  Fax [phone]

## 2022-09-19 DIAGNOSIS — H5213 Myopia, bilateral: Secondary | ICD-10-CM | POA: Diagnosis not present

## 2022-10-06 DIAGNOSIS — Z599 Problem related to housing and economic circumstances, unspecified: Secondary | ICD-10-CM | POA: Diagnosis not present

## 2022-10-06 DIAGNOSIS — Z5948 Other specified lack of adequate food: Secondary | ICD-10-CM | POA: Diagnosis not present

## 2022-10-06 DIAGNOSIS — S83206A Unspecified tear of unspecified meniscus, current injury, right knee, initial encounter: Secondary | ICD-10-CM | POA: Diagnosis not present

## 2022-10-06 DIAGNOSIS — R519 Headache, unspecified: Secondary | ICD-10-CM | POA: Diagnosis not present

## 2022-10-06 DIAGNOSIS — Z0289 Encounter for other administrative examinations: Secondary | ICD-10-CM | POA: Diagnosis not present

## 2022-10-06 DIAGNOSIS — K30 Functional dyspepsia: Secondary | ICD-10-CM | POA: Diagnosis not present

## 2022-11-08 DIAGNOSIS — H5213 Myopia, bilateral: Secondary | ICD-10-CM | POA: Diagnosis not present

## 2022-11-17 DIAGNOSIS — I10 Essential (primary) hypertension: Secondary | ICD-10-CM | POA: Diagnosis not present

## 2022-11-17 DIAGNOSIS — Z0289 Encounter for other administrative examinations: Secondary | ICD-10-CM | POA: Diagnosis not present

## 2022-11-17 DIAGNOSIS — R3915 Urgency of urination: Secondary | ICD-10-CM | POA: Diagnosis not present

## 2022-11-17 DIAGNOSIS — I700 Atherosclerosis of aorta: Secondary | ICD-10-CM | POA: Diagnosis not present

## 2022-11-26 ENCOUNTER — Encounter: Payer: Self-pay | Admitting: Internal Medicine

## 2022-11-26 ENCOUNTER — Ambulatory Visit (INDEPENDENT_AMBULATORY_CARE_PROVIDER_SITE_OTHER): Payer: 59 | Admitting: Internal Medicine

## 2022-11-26 ENCOUNTER — Telehealth: Payer: Self-pay | Admitting: Internal Medicine

## 2022-11-26 VITALS — BP 120/78 | HR 66 | Ht 63.0 in | Wt 122.0 lb

## 2022-11-26 DIAGNOSIS — E559 Vitamin D deficiency, unspecified: Secondary | ICD-10-CM | POA: Diagnosis not present

## 2022-11-26 DIAGNOSIS — M810 Age-related osteoporosis without current pathological fracture: Secondary | ICD-10-CM | POA: Diagnosis not present

## 2022-11-26 DIAGNOSIS — E0500 Thyrotoxicosis with diffuse goiter without thyrotoxic crisis or storm: Secondary | ICD-10-CM

## 2022-11-26 DIAGNOSIS — E0590 Thyrotoxicosis, unspecified without thyrotoxic crisis or storm: Secondary | ICD-10-CM | POA: Diagnosis not present

## 2022-11-26 LAB — COMPREHENSIVE METABOLIC PANEL
ALT: 17 U/L (ref 0–35)
AST: 20 U/L (ref 0–37)
Albumin: 4.1 g/dL (ref 3.5–5.2)
Alkaline Phosphatase: 53 U/L (ref 39–117)
BUN: 13 mg/dL (ref 6–23)
CO2: 25 mEq/L (ref 19–32)
Calcium: 9.2 mg/dL (ref 8.4–10.5)
Chloride: 107 mEq/L (ref 96–112)
Creatinine, Ser: 0.75 mg/dL (ref 0.40–1.20)
GFR: 78.99 mL/min (ref >60.00)
Glucose, Bld: 105 mg/dL — ABNORMAL HIGH (ref 70–99)
Potassium: 3.5 mEq/L (ref 3.5–5.1)
Sodium: 140 mEq/L (ref 135–145)
Total Bilirubin: 0.8 mg/dL (ref 0.2–1.2)
Total Protein: 6.8 g/dL (ref 6.0–8.3)

## 2022-11-26 LAB — CBC
HCT: 42.7 % (ref 36.0–46.0)
Hemoglobin: 14 g/dL (ref 12.0–15.0)
MCHC: 32.8 g/dL (ref 30.0–36.0)
MCV: 94.7 fl (ref 78.0–100.0)
Platelets: 191 10*3/uL (ref 150.0–400.0)
RBC: 4.51 Mil/uL (ref 3.87–5.11)
RDW: 12.5 % (ref 11.5–15.5)
WBC: 5 10*3/uL (ref 4.0–10.5)

## 2022-11-26 LAB — T4, FREE: Free T4: 0.84 ng/dL (ref 0.60–1.60)

## 2022-11-26 LAB — TSH: TSH: 1.41 u[IU]/mL (ref 0.35–5.50)

## 2022-11-26 LAB — VITAMIN D 25 HYDROXY (VIT D DEFICIENCY, FRACTURES): VITD: 28.37 ng/mL — ABNORMAL LOW (ref 30.00–100.00)

## 2022-11-26 NOTE — Progress Notes (Unsigned)
Name: Victoria Cardenas  MRN/ DOB: [id number], August 22, 1950    Age/ Sex: 72 y.o., female     PCP: Vladimir Crofts, FNP   Reason for Endocrinology Evaluation: Subclinical hyperthyroidism     Initial Endocrinology Clinic Visit: 08/25/2018    PATIENT IDENTIFIER: Victoria Cardenas is a 72 y.o., female with a past medical history of HTN, Hx of breast cancer (S/P lumpectomy ) and Osteoporosis   . She has followed with [facility] since 08/25/2018 for consultative assistance with management of her Subclinical hyperthyroidism.   HISTORICAL SUMMARY:   Pt was noted with low TSH in 11/2016, at the time she presented to the ED for palpitations.    In April, 2020 she presented again with palpitations and sob and was found to have a TSH of 0.056 uIU/mL with normal T3 and T4. This was attributed to Graves' Disease due to elevated TRAb level of 3.34 IU/L.   - She is S/P benign FNA of the right mid nodule in 2018 , other sub-centimeter nodules present on ultrasound bilaterally but did not require further follow up.  -Repeat thyroid ultrasound in 04/2019 showed stable right mid nodule at 2.9 cm    No FH of thyroid disease.    Osteoporosis : She was diagnosed with osteoporosis 2017, and was started on boniva at the time, but pt stopped it due to palpitations and diarrhea ~ 2 yrs ago.   I prescribed alendronate 2023, but the patient developed vomiting and diarrhea    SUBJECTIVE:    Today (11/26/2022):  Ms. Upright is here for a  4 months follow up visit on subclinical hyperthyroidism secondary to Graves' Disease.    Denies local neck swelling  Rare palpitations  Denies tremors  Has occasional constipation but no diarrhea  Denies falls Denies recent fracture   Medications: Methimazole 2.5 mg daily   MVI   HISTORY:  Past Medical History:  Past Medical History:  Diagnosis Date   Breast cancer (HCC) 2005   right breast   Dizziness    Glaucoma    Hypercholesteremia     Hypertension    Medication intolerance    multiple   Mild mitral regurgitation    NSVT (nonsustained ventricular tachycardia) (HCC)    Paresthesia    Shortened PR interval    SVT (supraventricular tachycardia)    Thyroid disease    Past Surgical History:  Past Surgical History:  Procedure Laterality Date   ABDOMINAL HYSTERECTOMY     BREAST EXCISIONAL BIOPSY Left 10/10/2015   benign   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 She has never used smokeless tobacco. She reports that she does not drink alcohol and does not use drugs. Family History:  Family History  Problem Relation Age of Onset   Hypertension Mother    Breast cancer Mother 52   Heart attack Sister    Breast cancer Maternal Grandmother    Breast cancer Maternal Aunt      HOME MEDICATIONS: Allergies as of 11/26/2022       Reactions   Amlodipine Swelling, Palpitations   Swelling, heart pounding   Penicillins Hives   Has patient had a PCN reaction causing immediate rash, facial/tongue/throat swelling, SOB or lightheadedness with hypotension: yes Has patient had a PCN reaction causing severe rash involving mucus membranes or skin necrosis: no Has patient had a PCN reaction that required hospitalization: no Has patient had a PCN reaction occurring  within the last 10 years: no If all of the above answers are "NO", then may proceed with Cephalosporin   Alendronate Other (See Comments)   Nausea and diarrhea    Cardizem Cd [diltiazem] Other (See Comments)   Pt reports this med causes her fatigue, dizziness, lightheaded, sore throat, and hard swallowing meals.   Lisinopril    Patient refused prior rx due to potential side effects outlined in medication information   Losartan Other (See Comments)   Pt reports causes frequent urination and UTIs   Propranolol Other (See Comments)   Pt reports causes flushing    Rosuvastatin Other (See Comments)   Pt reports caused her  to feel nauseated, dizzy, and lightheaded.    Ciprofloxacin Palpitations   Simvastatin Other (See Comments)   Increased urination        Medication List        Accurate as of November 26, 2022 11:59 PM. If you have any questions, ask your nurse or doctor.          brimonidine 0.1 % Soln Commonly known as: ALPHAGAN P 1 drop in both eyes daily   Calcium-Magnesium-Zinc-D3 Tabs Take by mouth. 1 daily   cholecalciferol 25 MCG (1000 UNIT) tablet Commonly known as: VITAMIN D3 Take 1,000 Units by mouth daily.   ezetimibe 10 MG tablet Commonly known as: ZETIA Take 1 tablet (10 mg total) by mouth daily.   Lumigan 0.01 % Soln Generic drug: bimatoprost Place 1 drop into both eyes at bedtime.   methimazole 5 MG tablet Commonly known as: TAPAZOLE Take 0.5 tablets (2.5 mg total) by mouth daily.          OBJECTIVE:   PHYSICAL EXAM: VS:BP 120/78 (BP Location: Left Arm, Patient Position: Sitting, Cuff Size: Small)   Pulse 66   Ht 5\' 3"  (1.6 m)   Wt 122 lb (55.3 kg)   SpO2 99%   BMI 21.61 kg/m     EXAM: General: Pt appears well and is in NAD  Neck: General: Supple without adenopathy. Thyroid: Right thyroid nodule appreciated.  Lungs: Clear with good BS bilat   Heart: Auscultation: RRR.  Extremities:  BL LE: No pretibial edema   Mental Status: Judgment, insight: Intact Orientation: Oriented to time, place, and person Mood and affect: No depression, anxiety, or agitation     DATA REVIEWED:  Latest Reference Range & Units 11/26/22 11:34  Sodium 135 - 145 mEq/L 140  Potassium 3.5 - 5.1 mEq/L 3.5  Chloride 96 - 112 mEq/L 107  CO2 19 - 32 mEq/L 25  Glucose 70 - 99 mg/dL 462 (H)  BUN 6 - 23 mg/dL 13  Creatinine 7.03 - 5.00 mg/dL 9.38  Calcium 8.4 - 18.2 mg/dL 9.2  Alkaline Phosphatase 39 - 117 U/L 53  Albumin 3.5 - 5.2 g/dL 4.1  AST 0 - 37 U/L 20  ALT 0 - 35 U/L 17  Total Protein 6.0 - 8.3 g/dL 6.8  Total Bilirubin 0.2 - 1.2 mg/dL 0.8  GFR >99.37 mL/min  78.99    Latest Reference Range & Units 11/26/22 11:34  VITD 30.00 - 100.00 ng/mL 28.37 (L)  WBC 4.0 - 10.5 K/uL 5.0  RBC 3.87 - 5.11 Mil/uL 4.51  Hemoglobin 12.0 - 15.0 g/dL 16.9  HCT 67.8 - 93.8 % 42.7  MCV 78.0 - 100.0 fl 94.7  MCHC 30.0 - 36.0 g/dL 10.1  RDW 75.1 - 02.5 % 12.5  Platelets 150.0 - 400.0 K/uL 191.0    Latest Reference Range &  Units 11/26/22 11:34  TSH 0.35 - 5.50 uIU/mL 1.41  T4,Free(Direct) 0.60 - 1.60 ng/dL 6.64      Latest Reference Range & Units 05/21/22 12:01  Sodium 135 - 145 mEq/L 139  Potassium 3.5 - 5.1 mEq/L 3.9  Chloride 96 - 112 mEq/L 104  CO2 19 - 32 mEq/L 29  Glucose 70 - 99 mg/dL 403 (H)  BUN 6 - 23 mg/dL 13  Creatinine 4.74 - 2.59 mg/dL 5.63  Calcium 8.4 - 87.5 mg/dL 9.2  GFR >64.33 mL/min 84.67     Results for RYNLEIGH, STAAL (MRN [id number]) as of 10/20/2018 09:48  Ref. Range 09/10/2018 11:13  TRAB Latest Ref Range: <=2.00 IU/L 3.34 (H)   Thyroid Ultrasound 06/11/2022  FINDINGS: Parenchymal Echotexture: Normal   Isthmus: 0.5 cm ,previously 0.4 cm   Right lobe: 5.1 x 2.1 x 2.4 cm ,previously 5.7 x 2.2 x 2.5 cm   Left lobe: 4.5 x 1.4 x 1.6 cm ,previously 4.7 x 1.2 x 1.8 cm   ________________________________________________________   Estimated total number of nodules >/= 1 cm: 3   Number of spongiform nodules >/=  2 cm not described below (TR1): 0   Number of mixed cystic and solid nodules >/= 1.5 cm not described below (TR2): 0   _________________________________________________________   Slight interval enlargement of previously biopsied right mid, mostly solid thyroid nodule which measures 3.3 x 2.0 x 1.8 cm, previously 3.0 x 1.5 x 2.8 cm. Similar appearance of multifocal bilateral spongiform and cystic nodules, each almost certainly benign and not warranting additional follow-up. No cervical lymphadenopathy.   IMPRESSION: 1. Similar benign appearing multinodular goiter. 2. Slight interval enlargement of previously  biopsied right mid mostly solid thyroid nodule (labeled 2, 3.3 cm, previously 3.0 cm). Recommend correlation with prior biopsy results.   FNA RMP nodule 2.9x1.4x1.8 (11/27/2015)   CONSISTENT WITH BENIGN FOLLICULAR NODULE (BETHESDA CATEGORY II).    DXA 10/31/2021   Narrative & Impression   ASSESSMENT: The BMD measured at Femur Neck Left is 0.583 g/cm2 with a T-score of -3.3. This patient is considered osteoporotic according to World Health Organization [facility]) criteria.    Site Region Measured Date Measured Age YA BMD Significant CHANGE T-score DualFemur Neck Left  10/31/20[age].3         -3.3    0.583 g/cm2 DualFemur Neck Left  12/22/2017    68.4         -3.3    0.582 g/cm2   AP Spine  L1-L4      10/31/2021    71.3         -3.1    0.806 g/cm2 AP Spine  L1-L4      12/22/2017    68.4         -3.1    0.815 g/cm2   DualFemur Total Mean 10/31/2021    72.3         -2.7    0.664 g/cm2 DualFemur Total Mean 12/22/2017    68.4         -2.7    0.662 g/cm2      ASSESSMENT / PLAN / RECOMMENDATIONS:   Hyperthyroidism    - This is partly due to Graves' Disease but toxic thyroid nodule can not be excluded - Patient is clinically euthyroid -TFTs remain within normal range   Medications  Continue Methimazole 2.5 mg daily     2. Multinodular Goiter    - No local neck symptoms  - She is S/P benign  FNA of the right mid nodule in 2017 - She is up-to-date on thyroid ultrasound 05/2022  3. Graves' Disease:  - No evidence of extra-thyroidal manifestations of graves' disease   4. Osteoporosis :  - I had started her on Alendronate but after taking one dose she developed nausea and diarrhea  -Today we again discussed the risk of bone fractures, we discussed risk and benefit of Prolia, patient opted to give this a try   Medication Continue MVI daily Start Prolia 60 mg subcu every 6 months  5) vitamin D insufficiency:  -Patient will be advised to add OTC vitamin D3 1000 IU  daily in addition to continuing with MVI    F/u in 6 month     Signed electronically by: Lyndle Herrlich, MD  [facility]  [facility] [address] [place], [place] [address] Phone: [phone] FAX: [phone]      CC: Vladimir Crofts, FNP [address] [place] [address] Phone: [phone]  Fax: [phone]   Return to Endocrinology clinic as below: Future Appointments  Date Time Provider Department Center  05/29/2023 11:30 AM Macintyre Alexa, Konrad Dolores, MD [facility] None

## 2022-11-26 NOTE — Telephone Encounter (Signed)
Can you please add this patient to the Prolia list?  Patient with severe osteoporosis, and has intolerance to alendronate  Thanks

## 2022-11-27 ENCOUNTER — Telehealth: Payer: Self-pay | Admitting: Internal Medicine

## 2022-11-27 DIAGNOSIS — E559 Vitamin D deficiency, unspecified: Secondary | ICD-10-CM | POA: Insufficient documentation

## 2022-11-27 MED ORDER — METHIMAZOLE 5 MG PO TABS
2.5000 mg | ORAL_TABLET | Freq: Every day | ORAL | 3 refills | Status: DC
Start: 2022-11-27 — End: 2023-06-01

## 2022-11-27 NOTE — Telephone Encounter (Signed)
I called and spoke to Victoria Cardenas and she is aware of the results and recommendation

## 2022-11-27 NOTE — Telephone Encounter (Signed)
Please let the patient know that her thyroid function remains within normal range, and to continue half a tablet of methimazole daily    Please let the patient know that her vitamin D is low, and she needs to continue with a multivitamin but needs to add OTC vitamin D3 at 1000 IU daily    Thanks

## 2022-11-27 NOTE — Telephone Encounter (Signed)
Prolia VOB initiated via [email or web address] 

## 2022-12-03 NOTE — Telephone Encounter (Signed)
Prior Auth required for PROLIA  PA PROCESS DETAILS: Please complete the prior authorization form located at [email or web address] or call [phone]  

## 2022-12-13 NOTE — Telephone Encounter (Signed)
Prior Authorization initiated for Victoria Cardenas via [facility] Provider portal.  Case ID: [id number]  Status: pending review

## 2022-12-18 DIAGNOSIS — H401131 Primary open-angle glaucoma, bilateral, mild stage: Secondary | ICD-10-CM | POA: Diagnosis not present

## 2022-12-18 DIAGNOSIS — H43813 Vitreous degeneration, bilateral: Secondary | ICD-10-CM | POA: Diagnosis not present

## 2022-12-18 DIAGNOSIS — H04123 Dry eye syndrome of bilateral lacrimal glands: Secondary | ICD-10-CM | POA: Diagnosis not present

## 2022-12-23 NOTE — Telephone Encounter (Signed)
Prior Auth APPROVED PA# [id number] Valid:12/13/22-12/13/23

## 2022-12-23 NOTE — Telephone Encounter (Signed)
Pt ready for scheduling on or after 12/13/22  Out-of-pocket cost due at time of visit: $345  Primary: [employer]  Medicare Dual Complete HMO-POS Prolia co-insurance: 20% (approximately $320) Admin fee co-insurance: 20% (approximately $25)  Deductible: does not apply  Prior Auth: APPROVED PA# [id number] Valid:12/13/22-12/13/23  Secondary: N/A Prolia co-insurance:  Admin fee co-insurance:  Deductible:  Prior Auth:  PA# Valid:   ** This summary of benefits is an estimation of the patient's out-of-pocket cost. Exact cost may vary based on individual plan coverage.

## 2022-12-24 NOTE — Telephone Encounter (Signed)
Patient would like to try the Reclast and if she can't afford it she would like to go back to pill.

## 2022-12-25 ENCOUNTER — Encounter: Payer: Self-pay | Admitting: Internal Medicine

## 2022-12-25 ENCOUNTER — Telehealth: Payer: Self-pay

## 2022-12-25 DIAGNOSIS — M810 Age-related osteoporosis without current pathological fracture: Secondary | ICD-10-CM | POA: Insufficient documentation

## 2022-12-25 NOTE — Telephone Encounter (Signed)
Auth Submission: NO AUTH NEEDED Site of care: Site of care: [facility] Payer: UHC Medicare Medication & CPT/J Code(s) submitted: Reclast (Zolendronic acid) [id number] Route of submission (phone, fax, portal):  Phone # Fax # Auth type: Buy/Bill PB Units/visits requested: 5mg  x 1 dose Approval from: 12/25/22 to 04/28/23

## 2022-12-25 NOTE — Telephone Encounter (Signed)
Reclast infusion ordered

## 2022-12-25 NOTE — Addendum Note (Signed)
Addended by: Scarlette Shorts on: 12/25/2022 08:49 AM   Modules accepted: Orders

## 2023-01-07 DIAGNOSIS — K5900 Constipation, unspecified: Secondary | ICD-10-CM | POA: Diagnosis not present

## 2023-01-07 DIAGNOSIS — N3281 Overactive bladder: Secondary | ICD-10-CM | POA: Diagnosis not present

## 2023-01-07 DIAGNOSIS — Z0289 Encounter for other administrative examinations: Secondary | ICD-10-CM | POA: Diagnosis not present

## 2023-01-13 ENCOUNTER — Ambulatory Visit (INDEPENDENT_AMBULATORY_CARE_PROVIDER_SITE_OTHER): Payer: 59

## 2023-01-13 VITALS — BP 166/82 | HR 62 | Temp 97.9°F | Resp 18 | Ht 63.0 in | Wt 126.6 lb

## 2023-01-13 DIAGNOSIS — M810 Age-related osteoporosis without current pathological fracture: Secondary | ICD-10-CM

## 2023-01-13 MED ORDER — ZOLEDRONIC ACID 5 MG/100ML IV SOLN
5.0000 mg | Freq: Once | INTRAVENOUS | Status: AC
  Administered 2023-01-13: 5 mg via INTRAVENOUS
  Filled 2023-01-13: qty 100

## 2023-01-13 MED ORDER — ACETAMINOPHEN 325 MG PO TABS
650.0000 mg | ORAL_TABLET | Freq: Once | ORAL | Status: AC
  Administered 2023-01-13: 650 mg via ORAL
  Filled 2023-01-13: qty 2

## 2023-01-13 MED ORDER — SODIUM CHLORIDE 0.9 % IV SOLN: INTRAVENOUS | Status: DC

## 2023-01-13 MED ORDER — DIPHENHYDRAMINE HCL 25 MG PO CAPS
25.0000 mg | ORAL_CAPSULE | Freq: Once | ORAL | Status: AC
  Administered 2023-01-13: 25 mg via ORAL
  Filled 2023-01-13: qty 1

## 2023-01-13 NOTE — Progress Notes (Signed)
Diagnosis: Osteoporosis  Provider:  Chilton Greathouse MD  Procedure: IV Infusion  IV Type: Peripheral, IV Location: R Antecubital  Reclast (Zolendronic Acid), Dose: 5 mg  Infusion Start Time: 1101  Infusion Stop Time: 1136  Post Infusion IV Care: Observation period completed and Peripheral IV Discontinued  Discharge: Condition: Good, Destination: Home . AVS Provided  Performed by:  Wyvonne Lenz, RN

## 2023-02-04 DIAGNOSIS — E7800 Pure hypercholesterolemia, unspecified: Secondary | ICD-10-CM | POA: Diagnosis not present

## 2023-02-04 DIAGNOSIS — E0500 Thyrotoxicosis with diffuse goiter without thyrotoxic crisis or storm: Secondary | ICD-10-CM | POA: Diagnosis not present

## 2023-02-04 DIAGNOSIS — R350 Frequency of micturition: Secondary | ICD-10-CM | POA: Diagnosis not present

## 2023-02-04 DIAGNOSIS — R7303 Prediabetes: Secondary | ICD-10-CM | POA: Diagnosis not present

## 2023-02-13 DIAGNOSIS — M25562 Pain in left knee: Secondary | ICD-10-CM | POA: Diagnosis not present

## 2023-02-13 DIAGNOSIS — M25561 Pain in right knee: Secondary | ICD-10-CM | POA: Diagnosis not present

## 2023-02-25 DIAGNOSIS — M2242 Chondromalacia patellae, left knee: Secondary | ICD-10-CM | POA: Diagnosis not present

## 2023-02-25 DIAGNOSIS — M2241 Chondromalacia patellae, right knee: Secondary | ICD-10-CM | POA: Diagnosis not present

## 2023-03-04 DIAGNOSIS — M2242 Chondromalacia patellae, left knee: Secondary | ICD-10-CM | POA: Diagnosis not present

## 2023-03-04 DIAGNOSIS — M2241 Chondromalacia patellae, right knee: Secondary | ICD-10-CM | POA: Diagnosis not present

## 2023-03-09 DIAGNOSIS — M2242 Chondromalacia patellae, left knee: Secondary | ICD-10-CM | POA: Diagnosis not present

## 2023-03-09 DIAGNOSIS — M2241 Chondromalacia patellae, right knee: Secondary | ICD-10-CM | POA: Diagnosis not present

## 2023-03-30 DIAGNOSIS — M2241 Chondromalacia patellae, right knee: Secondary | ICD-10-CM | POA: Diagnosis not present

## 2023-05-11 NOTE — Progress Notes (Signed)
 Cardiology Office Note:  .   Date:  05/13/2023  ID:  Victoria Cardenas, DOB 1951-01-13, MRN [id number] PCP: Euell Herrlich, MD  [facility] Providers Cardiologist:  Euell Herrlich, MD {  History of Present Illness: .   Victoria Cardenas is a 73 y.o. female with a past medical history of hypertension, palpitations/SVT with Graves' disease who was followed by Dr. Ardell Beauvais here for annual follow-up.  Of note, patient has been maintained off all of hypertension medications due to multiple medication intolerances.  History of ER visit 12/2020 for chest pain.  Coronary CTA with no significant CAD, calcium  score 0.  Was seen in the clinic by Marlyse Single, PA-C on 04/2021 where she was doing well.  Declined PCSK9 inhibitor and opted to continue with lifestyle modifications.  Was seen in the office February 2024 by Dr. Ardell Beauvais and overall was feeling well at that time.  Denied chest pain, SOB, orthopnea, PND, and lower extremity edema.  Rare palpitations but not overly bothersome.  Blood pressure fluctuating at home between 110 and 150 systolic.  Remained active and felt well without any anginal symptoms.  Declined further medications for blood pressure due to numerous intolerances.  Today, she presents with a history of hypertension and mitral valve regurgitation with recent elevations in blood pressure. She has been monitoring her blood pressure at home and reports that it has been consistently in the 140s. She attributes this recent increase to stress since the holidays. She has tried multiple antihypertensive medications in the past, including losartan , lisinopril , Cardizem , amlodipine, and propranolol , but all caused side effects. She has been managing her blood pressure without medication for the past eight years.  The patient also reports occasional heart fluttering, with the most recent episode occurring in December. She has a known leaky mitral valve, last evaluated in 2020, which was  reported as a mild leak at that time. She denies any associated symptoms such as leg swelling, lightheadedness, dizziness, or chest pain.  Regarding her cholesterol, the patient's LDL was slightly above the goal of less than 100 a year ago. She has tried multiple cholesterol-lowering medications in the past, including Zetia , rosuvastatin , and simvastatin, but all caused side effects  We rechecked her blood pressure at the end of the visit and it was 128/76.  Reports no shortness of breath nor dyspnea on exertion. Reports no chest pain, pressure, or tightness. No edema, orthopnea, PND. Reports no palpitations.   Discussed the use of AI scribe software for clinical note transcription with the patient, who gave verbal consent to proceed.  ROS: Pertinent ROS in HPI  Studies Reviewed: Aaron Aas        Coronary CTA 12/2020: FINDINGS: Non-cardiac: See separate report from [facility].   The pulmonary veins drain normally to the left atrium. No LA appendage thrombus.   Calcium  Score: 0 Agatston units.   Coronary Arteries: Right dominant with no anomalies   LM: No plaque or stenosis.   LAD system: No plaque or stenosis, small caliber distal LAD.   Circumflex system: No plaque or stenosis.   RCA system: No plaque or stenosis.   IMPRESSION: 1. Coronary calcium  score 0 Agatston units, suggesting low risk for future cardiac events.   ECHO IMPRESSIONS 09/2018    1. The left ventricle has normal systolic function with an ejection  fraction of 60-65%. The cavity size was normal. Left ventricular diastolic  parameters were normal. No evidence of left ventricular regional wall  motion abnormalities.  2. The right ventricle has normal systolic function. The cavity was  normal. There is no increase in right ventricular wall thickness. Right  ventricular systolic pressure could not be assessed.   3. Mild thickening of the mitral valve leaflet. The MR jet is  centrally-directed.   4. Mild  thickening of the aortic valve. No stenosis of the aortic valve.   Cardiac Monitor 09/09/18:   Sinus tach cardia to sinus tachycardia. 1 episode of ventricular tachycardia for total of 9 beats with ventricular rate 170 bpm. 43 episodes of SVTs some of which seem to represent atrial fibrillation.   Sinus tach cardia to sinus tachycardia. 1 episode of ventricular tachycardia for total of 9 beats with ventricular rate 170 bpm. 43 episodes of SVTs some of which seem to represent atrial fibrillation       Physical Exam:   VS:  BP 128/76 (BP Location: Right Arm, Patient Position: Sitting)   Pulse 63   Ht 5\' 3"  (1.6 m)   Wt 120 lb 9.6 oz (54.7 kg)   SpO2 99%   BMI 21.36 kg/m    Wt Readings from Last 3 Encounters:  05/13/23 120 lb 9.6 oz (54.7 kg)  01/13/23 126 lb 9.6 oz (57.4 kg)  11/26/22 122 lb (55.3 kg)    GEN: Well nourished, well developed in no acute distress NECK: No JVD; No carotid bruits CARDIAC: RRR, no murmurs, rubs, gallops RESPIRATORY:  Clear to auscultation without rales, wheezing or rhonchi  ABDOMEN: Soft, non-tender, non-distended EXTREMITIES:  No edema; No deformity   ASSESSMENT AND PLAN: .    Hypertension Fluctuating blood pressure, ranging from 110 to 150 systolic, with recent readings in the 140s. Patient has a history of intolerance to multiple antihypertensive medications (losartan , lisinopril , Cardizem , amlodipine, propranolol ). Blood pressure appears to be stress-related. -Continue monitoring blood pressure at home. -Consider management of stress and anxiety as a potential approach to blood pressure control. -Continue low-sodium diet and regular exercise.  Mitral Valve Regurgitation Mild leak noted on last echocardiogram in 2020. No recent symptoms of heart failure such as leg swelling or lightheadedness. -Plan for repeat echocardiogram in 2026 to monitor progression of mitral valve leak.  Hyperlipidemia LDL was 133 on last check a year ago, above the  goal of less than 100 for patients without coronary disease. Patient has a history of intolerance to multiple statins (rosuvastatin , simvastatin) and Zetia . -Order fasting lipid panel today. -Consider low-dose lipid-lowering medication if LDL remains above goal, pending patient's tolerance.       Dispo: She will follow-up with Dr. Chancy Comber in 6 months.  Signed, Von Grumbling, PA-C

## 2023-05-12 DIAGNOSIS — I10 Essential (primary) hypertension: Secondary | ICD-10-CM | POA: Diagnosis not present

## 2023-05-12 DIAGNOSIS — G629 Polyneuropathy, unspecified: Secondary | ICD-10-CM | POA: Diagnosis not present

## 2023-05-12 DIAGNOSIS — Z1383 Encounter for screening for respiratory disorder NEC: Secondary | ICD-10-CM | POA: Diagnosis not present

## 2023-05-13 ENCOUNTER — Encounter: Payer: Self-pay | Admitting: Physician Assistant

## 2023-05-13 ENCOUNTER — Ambulatory Visit: Payer: 59 | Attending: Physician Assistant | Admitting: Physician Assistant

## 2023-05-13 VITALS — BP 128/76 | HR 63 | Ht 63.0 in | Wt 120.6 lb

## 2023-05-13 DIAGNOSIS — E785 Hyperlipidemia, unspecified: Secondary | ICD-10-CM | POA: Diagnosis not present

## 2023-05-13 DIAGNOSIS — I471 Supraventricular tachycardia, unspecified: Secondary | ICD-10-CM | POA: Diagnosis not present

## 2023-05-13 DIAGNOSIS — E7800 Pure hypercholesterolemia, unspecified: Secondary | ICD-10-CM | POA: Diagnosis not present

## 2023-05-13 DIAGNOSIS — I10 Essential (primary) hypertension: Secondary | ICD-10-CM

## 2023-05-13 DIAGNOSIS — E0500 Thyrotoxicosis with diffuse goiter without thyrotoxic crisis or storm: Secondary | ICD-10-CM

## 2023-05-13 NOTE — Patient Instructions (Signed)
.  Medication Instructions:  No changes today. *If you need a refill on your cardiac medications before your next appointment, please call your pharmacy*   Lab Work: Fasting LFT Lipid panel today If you have labs (blood work) drawn today and your tests are completely normal, you will receive your results only by: MyChart Message (if you have MyChart) OR A paper copy in the mail If you have any lab test that is abnormal or we need to change your treatment, we will call you to review the results.     Follow-Up: At [facility], you and your health needs are our priority.  As part of our continuing mission to provide you with exceptional heart care, we have created designated Provider Care Teams.  These Care Teams include your primary Cardiologist (physician) and Advanced Practice Providers (APPs -  Physician Assistants and Nurse Practitioners) who all work together to provide you with the care you need, when you need it.  We recommend signing up for the patient portal called "MyChart".  Sign up information is provided on this After Visit Summary.  MyChart is used to connect with patients for Virtual Visits (Telemedicine).  Patients are able to view lab/test results, encounter notes, upcoming appointments, etc.  Non-urgent messages can be sent to your provider as well.   To learn more about what you can do with MyChart, go to [email or web address].    Your next appointment:   6 month(s)  Provider:   Gayatri A Acharya, MD     Other Instructions

## 2023-05-14 ENCOUNTER — Telehealth: Payer: Self-pay

## 2023-05-14 DIAGNOSIS — E7800 Pure hypercholesterolemia, unspecified: Secondary | ICD-10-CM

## 2023-05-14 DIAGNOSIS — E785 Hyperlipidemia, unspecified: Secondary | ICD-10-CM

## 2023-05-14 LAB — HEPATIC FUNCTION PANEL
ALT: 20 [IU]/L (ref 0–32)
AST: 24 [IU]/L (ref 0–40)
Albumin: 4.6 g/dL (ref 3.8–4.8)
Alkaline Phosphatase: 43 [IU]/L — ABNORMAL LOW (ref 44–121)
Bilirubin Total: 0.7 mg/dL (ref 0.0–1.2)
Bilirubin, Direct: 0.19 mg/dL (ref 0.00–0.40)
Total Protein: 6.9 g/dL (ref 6.0–8.5)

## 2023-05-14 LAB — LIPID PANEL
Chol/HDL Ratio: 4.8 {ratio} — ABNORMAL HIGH (ref 0.0–4.4)
Cholesterol, Total: 207 mg/dL — ABNORMAL HIGH (ref 100–199)
HDL: 43 mg/dL (ref >39)
LDL Chol Calc (NIH): 141 mg/dL — ABNORMAL HIGH (ref 0–99)
Triglycerides: 129 mg/dL (ref 0–149)
VLDL Cholesterol Cal: 23 mg/dL (ref 5–40)

## 2023-05-14 NOTE — Telephone Encounter (Signed)
Pt advised her lab results and says she will work hard on her diet as discussed.. she says she was not watching her diet over the holidays.   She will plan to come back in March for repeat Lipids.. will send to Tess for review.

## 2023-05-14 NOTE — Telephone Encounter (Signed)
-----   Message from Sharlene Dory sent at 05/14/2023 10:41 AM EST ----- Ms. Mesta,   Your LDL or bad cholesterol remains elevated at 141.  Goal is less than 100.  I know that you have multiple medication intolerances but I would consider adding a nonstatin agent if you are open to it.  If there is over dietary changes like we discussed in the office, we could pursue these and then recheck your lipid panel in about 8 weeks.  Liver function looks great other than a slightly low Alk phos.   Please let me know if you decide  Sharlene Dory, PA-C

## 2023-05-22 DIAGNOSIS — G629 Polyneuropathy, unspecified: Secondary | ICD-10-CM | POA: Diagnosis not present

## 2023-05-22 DIAGNOSIS — I10 Essential (primary) hypertension: Secondary | ICD-10-CM | POA: Diagnosis not present

## 2023-05-29 ENCOUNTER — Ambulatory Visit (INDEPENDENT_AMBULATORY_CARE_PROVIDER_SITE_OTHER): Payer: 59 | Admitting: Internal Medicine

## 2023-05-29 VITALS — BP 118/72 | HR 66 | Ht 63.0 in | Wt 119.0 lb

## 2023-05-29 DIAGNOSIS — M810 Age-related osteoporosis without current pathological fracture: Secondary | ICD-10-CM | POA: Diagnosis not present

## 2023-05-29 DIAGNOSIS — E042 Nontoxic multinodular goiter: Secondary | ICD-10-CM | POA: Diagnosis not present

## 2023-05-29 DIAGNOSIS — E0590 Thyrotoxicosis, unspecified without thyrotoxic crisis or storm: Secondary | ICD-10-CM | POA: Diagnosis not present

## 2023-05-29 DIAGNOSIS — E0500 Thyrotoxicosis with diffuse goiter without thyrotoxic crisis or storm: Secondary | ICD-10-CM

## 2023-05-29 NOTE — Progress Notes (Unsigned)
Name: Victoria Cardenas  MRN/ DOB: [id number], April 08, 1951    Age/ Sex: 73 y.o., female     PCP: Parke Poisson, MD   Reason for Endocrinology Evaluation: Subclinical hyperthyroidism     Initial Endocrinology Clinic Visit: 08/25/2018    PATIENT IDENTIFIER: Victoria Cardenas is a 73 y.o., female with a past medical history of HTN, Hx of breast cancer (S/P lumpectomy ) and Osteoporosis   . She has followed with [facility] since 08/25/2018 for consultative assistance with management of her Subclinical hyperthyroidism.   HISTORICAL SUMMARY:   Pt was noted with low TSH in 11/2016, at the time she presented to the ED for palpitations.    In April, 2020 she presented again with palpitations and sob and was found to have a TSH of 0.056 uIU/mL with normal T3 and T4. This was attributed to Graves' Disease due to elevated TRAb level of 3.34 IU/L.   - She is S/P benign FNA of the right mid nodule in 2018 , other sub-centimeter nodules present on ultrasound bilaterally but did not require further follow up.  -Repeat thyroid ultrasound in 04/2019 showed stable right mid nodule at 2.9 cm    No FH of thyroid disease.    Osteoporosis : She was diagnosed with osteoporosis 2017, and was started on boniva at the time, but pt stopped it due to palpitations and diarrhea ~ 2 yrs ago.   I prescribed alendronate 2023, but the patient developed vomiting and diarrhea  Prolia was cost prohibitive in 2024 Received Reclast infusion 01/13/2023, but she attributed it shifting in her teeth?  And requiring dental work to request infusion, she also attributed" popping of the joint" and opted not to receive any more Reclast infusions.  SUBJECTIVE:    Today (11/26/2022):  Victoria Cardenas is here for a follow up visit on subclinical hyperthyroidism secondary to Graves' Disease and osteoporosis   She received her first Reclast infusion 01/13/2023, she had popping and shifted her teeth ?  She continues to  follow-up with cardiology for SVT   Denies local neck swelling  Denies recent palpitations  Denies tremors  Continues with occasional constipation but no diarrhea  Denies falls Denies recent fracture   Medications: Methimazole 2.5 mg daily   Vitamin D 1000 international unit  daily  MVI   HISTORY:  Past Medical History:  Past Medical History:  Diagnosis Date   Breast cancer (HCC) 2005   right breast   Dizziness    Glaucoma    Hypercholesteremia    Hypertension    Medication intolerance    multiple   Mild mitral regurgitation    NSVT (nonsustained ventricular tachycardia) (HCC)    Paresthesia    Shortened PR interval    SVT (supraventricular tachycardia) (HCC)    Thyroid disease    Past Surgical History:  Past Surgical History:  Procedure Laterality Date   ABDOMINAL HYSTERECTOMY     BREAST EXCISIONAL BIOPSY Left 10/10/2015   benign   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 She has never used smokeless tobacco. She reports that she does not drink alcohol and does not use drugs. Family History:  Family History  Problem Relation Age of Onset   Hypertension Mother    Breast cancer Mother 92   Heart attack Sister    Breast cancer Maternal Grandmother    Breast cancer Maternal Aunt      HOME MEDICATIONS: Allergies  as of 05/29/2023       Reactions   Amlodipine Swelling, Palpitations   Swelling, heart pounding   Penicillins Hives   Has patient had a PCN reaction causing immediate rash, facial/tongue/throat swelling, SOB or lightheadedness with hypotension: yes Has patient had a PCN reaction causing severe rash involving mucus membranes or skin necrosis: no Has patient had a PCN reaction that required hospitalization: no Has patient had a PCN reaction occurring within the last 10 years: no If all of the above answers are "NO", then may proceed with Cephalosporin   Alendronate Other (See Comments)   Nausea and  diarrhea    Cardizem Cd [diltiazem] Other (See Comments)   Pt reports this med causes her fatigue, dizziness, lightheaded, sore throat, and hard swallowing meals.   Lisinopril    Patient refused prior rx due to potential side effects outlined in medication information   Losartan Other (See Comments)   Pt reports causes frequent urination and UTIs   Propranolol Other (See Comments)   Pt reports causes flushing    Rosuvastatin Other (See Comments)   Pt reports caused her to feel nauseated, dizzy, and lightheaded.    Ciprofloxacin Palpitations   Simvastatin Other (See Comments)   Increased urination        Medication List        Accurate as of May 29, 2023 11:28 AM. If you have any questions, ask your nurse or doctor.          brimonidine 0.1 % Soln Commonly known as: ALPHAGAN P 1 drop in both eyes daily   Calcium-Magnesium-Zinc-D3 Tabs Take by mouth. 1 daily   cholecalciferol 25 MCG (1000 UNIT) tablet Commonly known as: VITAMIN D3 Take 1,000 Units by mouth daily.   GaviLAX 17 GM/SCOOP powder Generic drug: polyethylene glycol powder Take 17 g by mouth daily.   Lumigan 0.01 % Soln Generic drug: bimatoprost Place 1 drop into both eyes at bedtime.   methimazole 5 MG tablet Commonly known as: TAPAZOLE Take 0.5 tablets (2.5 mg total) by mouth daily.   oxybutynin 5 MG 24 hr tablet Commonly known as: DITROPAN-XL Take 5 mg by mouth daily.          OBJECTIVE:   PHYSICAL EXAM: VS:BP 118/72 (BP Location: Left Arm, Patient Position: Sitting, Cuff Size: Small)   Pulse 66   Ht 5\' 3"  (1.6 m)   Wt 119 lb (54 kg)   SpO2 94%   BMI 21.08 kg/m     EXAM: General: Pt appears well and is in NAD  Neck: General: Supple without adenopathy. Thyroid: Right thyroid nodule appreciated.  Lungs: Clear with good BS bilat   Heart: Auscultation: RRR.  Extremities:  BL LE: No pretibial edema   Mental Status: Judgment, insight: Intact Orientation: Oriented to time,  place, and person Mood and affect: No depression, anxiety, or agitation     DATA REVIEWED:   Latest Reference Range & Units 05/29/23 12:03  Vitamin D, 25-Hydroxy 30 - 100 ng/mL 43    Latest Reference Range & Units 05/29/23 12:03  TSH 0.40 - 4.50 mIU/L 2.16  T4,Free(Direct) 0.8 - 1.8 ng/dL 1.1     Latest Reference Range & Units 05/13/23 12:56  Alkaline Phosphatase 44 - 121 IU/L 43 (L)  Albumin 3.8 - 4.8 g/dL 4.6  AST 0 - 40 IU/L 24  ALT 0 - 32 IU/L 20  Total Protein 6.0 - 8.5 g/dL 6.9  Total Bilirubin 0.0 - 1.2 mg/dL 0.7  BILIRUBIN, DIRECT 0.00 -  0.40 mg/dL 1.61  (L): Data is abnormally low   Results for DALEENA, ROTTER (MRN [id number]) as of 10/20/2018 09:48  Ref. Range 09/10/2018 11:13  TRAB Latest Ref Range: <=2.00 IU/L 3.34 (H)   Thyroid Ultrasound 06/11/2022  FINDINGS: Parenchymal Echotexture: Normal   Isthmus: 0.5 cm ,previously 0.4 cm   Right lobe: 5.1 x 2.1 x 2.4 cm ,previously 5.7 x 2.2 x 2.5 cm   Left lobe: 4.5 x 1.4 x 1.6 cm ,previously 4.7 x 1.2 x 1.8 cm   ________________________________________________________   Estimated total number of nodules >/= 1 cm: 3   Number of spongiform nodules >/=  2 cm not described below (TR1): 0   Number of mixed cystic and solid nodules >/= 1.5 cm not described below (TR2): 0   _________________________________________________________   Slight interval enlargement of previously biopsied right mid, mostly solid thyroid nodule which measures 3.3 x 2.0 x 1.8 cm, previously 3.0 x 1.5 x 2.8 cm. Similar appearance of multifocal bilateral spongiform and cystic nodules, each almost certainly benign and not warranting additional follow-up. No cervical lymphadenopathy.   IMPRESSION: 1. Similar benign appearing multinodular goiter. 2. Slight interval enlargement of previously biopsied right mid mostly solid thyroid nodule (labeled 2, 3.3 cm, previously 3.0 cm). Recommend correlation with prior biopsy results.   FNA RMP  nodule 2.9x1.4x1.8 (11/27/2015)   CONSISTENT WITH BENIGN FOLLICULAR NODULE (BETHESDA CATEGORY II).    DXA 10/31/2021   Narrative & Impression   ASSESSMENT: The BMD measured at Femur Neck Left is 0.583 g/cm2 with a T-score of -3.3. This patient is considered osteoporotic according to World Health Organization [facility]) criteria.    Site Region Measured Date Measured Age YA BMD Significant CHANGE T-score DualFemur Neck Left  10/31/2020    72.3         -3.3    0.583 g/cm2 DualFemur Neck Left  12/22/2017    68.4         -3.3    0.582 g/cm2   AP Spine  L1-L4      10/31/2021    71.3         -3.1    0.806 g/cm2 AP Spine  L1-L4      12/22/2017    68.4         -3.1    0.815 g/cm2   DualFemur Total Mean 10/31/2021    72.3         -2.7    0.664 g/cm2 DualFemur Total Mean 12/22/2017    68.4         -2.7    0.662 g/cm2      ASSESSMENT / PLAN / RECOMMENDATIONS:   Hyperthyroidism    - This is partly due to Graves' Disease but toxic thyroid nodule can not be excluded - Patient is clinically euthyroid -TFTs remain within normal range -No change   Medications  Continue Methimazole 2.5 mg daily     2. Multinodular Goiter    - No local neck symptoms  - She is S/P benign FNA of the right mid nodule in 2017 -Will proceed with thyroid ultrasound for this year  3. Graves' Disease:  - No evidence of extra-thyroidal manifestations of graves' disease   4. Osteoporosis :  - I had started her on Alendronate  in 2023 but after taking one dose she developed nausea and diarrhea  -Prolia was because prohibitive, received Reclast infusion 2024.  But she opted not to continue due to shifting of the teeth?  And popping of  the joint.  I did explain to the patient that 1 injection of Reclast would not cause shifting of the teeth and this must be a chronic issue and we also discussed that popping sounds is related to osteoarthritis  Medication Continue MVI daily  5) Vitamin D  insufficiency:  -Vitamin D is normal -No change  Continue vitamin D 3 1000 international units daily   F/u in 1 yr     Signed electronically by: Lyndle Herrlich, MD  [facility]  [facility] [address] [place], [place] [address] Phone: [phone] FAX: [phone]      CC: Parke Poisson, MD [address] [place] [address] Phone: [phone]  Fax: [phone]   Return to Endocrinology clinic as below: Future Appointments  Date Time Provider Department Center  05/29/2023 11:30 AM Kessler Solly, Konrad Dolores, MD [facility] None  10/23/2023  9:00 AM Parke Poisson, MD [facility] None

## 2023-05-30 LAB — VITAMIN D 25 HYDROXY (VIT D DEFICIENCY, FRACTURES): Vit D, 25-Hydroxy: 43 ng/mL (ref 30–100)

## 2023-05-30 LAB — TSH: TSH: 2.16 m[IU]/L (ref 0.40–4.50)

## 2023-05-30 LAB — T4, FREE: Free T4: 1.1 ng/dL (ref 0.8–1.8)

## 2023-06-01 ENCOUNTER — Encounter: Payer: Self-pay | Admitting: Internal Medicine

## 2023-06-01 MED ORDER — METHIMAZOLE 5 MG PO TABS: 2.5000 mg | ORAL_TABLET | Freq: Every day | ORAL | 3 refills | Status: DC

## 2023-06-04 ENCOUNTER — Telehealth: Payer: Self-pay

## 2023-06-04 NOTE — Telephone Encounter (Signed)
 LDTVM with lab results

## 2023-06-09 ENCOUNTER — Ambulatory Visit
Admission: RE | Admit: 2023-06-09 | Discharge: 2023-06-09 | Disposition: A | Discharge status: Home / Self Care | Payer: 59 | Source: Ambulatory Visit | Attending: Internal Medicine | Admitting: Internal Medicine

## 2023-06-09 DIAGNOSIS — E042 Nontoxic multinodular goiter: Secondary | ICD-10-CM

## 2023-06-12 DIAGNOSIS — H401121 Primary open-angle glaucoma, left eye, mild stage: Secondary | ICD-10-CM | POA: Diagnosis not present

## 2023-06-12 DIAGNOSIS — H2513 Age-related nuclear cataract, bilateral: Secondary | ICD-10-CM | POA: Diagnosis not present

## 2023-06-12 DIAGNOSIS — H35033 Hypertensive retinopathy, bilateral: Secondary | ICD-10-CM | POA: Diagnosis not present

## 2023-06-12 DIAGNOSIS — H401112 Primary open-angle glaucoma, right eye, moderate stage: Secondary | ICD-10-CM | POA: Diagnosis not present

## 2023-06-12 DIAGNOSIS — H04123 Dry eye syndrome of bilateral lacrimal glands: Secondary | ICD-10-CM | POA: Diagnosis not present

## 2023-06-19 ENCOUNTER — Encounter: Payer: Self-pay | Admitting: Internal Medicine

## 2023-06-19 DIAGNOSIS — G629 Polyneuropathy, unspecified: Secondary | ICD-10-CM | POA: Diagnosis not present

## 2023-06-19 DIAGNOSIS — I10 Essential (primary) hypertension: Secondary | ICD-10-CM | POA: Diagnosis not present

## 2023-06-19 DIAGNOSIS — E209 Hypoparathyroidism, unspecified: Secondary | ICD-10-CM | POA: Diagnosis not present

## 2023-06-19 DIAGNOSIS — E0500 Thyrotoxicosis with diffuse goiter without thyrotoxic crisis or storm: Secondary | ICD-10-CM | POA: Diagnosis not present

## 2023-06-19 DIAGNOSIS — I700 Atherosclerosis of aorta: Secondary | ICD-10-CM | POA: Diagnosis not present

## 2023-06-19 DIAGNOSIS — N3281 Overactive bladder: Secondary | ICD-10-CM | POA: Diagnosis not present

## 2023-06-19 DIAGNOSIS — E041 Nontoxic single thyroid nodule: Secondary | ICD-10-CM | POA: Diagnosis not present

## 2023-06-19 DIAGNOSIS — R7303 Prediabetes: Secondary | ICD-10-CM | POA: Diagnosis not present

## 2023-06-19 DIAGNOSIS — K219 Gastro-esophageal reflux disease without esophagitis: Secondary | ICD-10-CM | POA: Diagnosis not present

## 2023-06-19 DIAGNOSIS — E7800 Pure hypercholesterolemia, unspecified: Secondary | ICD-10-CM | POA: Diagnosis not present

## 2023-06-23 NOTE — Telephone Encounter (Signed)
 Patient notified

## 2023-06-25 ENCOUNTER — Other Ambulatory Visit: Payer: Self-pay | Admitting: Family

## 2023-06-25 DIAGNOSIS — R2232 Localized swelling, mass and lump, left upper limb: Secondary | ICD-10-CM

## 2023-07-27 ENCOUNTER — Ambulatory Visit
Admission: RE | Admit: 2023-07-27 | Discharge: 2023-07-27 | Disposition: A | Discharge status: Home / Self Care | Payer: 59 | Source: Ambulatory Visit | Attending: Family | Admitting: Family

## 2023-07-27 ENCOUNTER — Other Ambulatory Visit: Payer: Self-pay | Admitting: Family

## 2023-07-27 DIAGNOSIS — R2232 Localized swelling, mass and lump, left upper limb: Secondary | ICD-10-CM

## 2023-07-27 DIAGNOSIS — R921 Mammographic calcification found on diagnostic imaging of breast: Secondary | ICD-10-CM

## 2023-08-27 ENCOUNTER — Other Ambulatory Visit: Payer: Self-pay | Admitting: Family

## 2023-08-27 DIAGNOSIS — M810 Age-related osteoporosis without current pathological fracture: Secondary | ICD-10-CM

## 2023-08-27 DIAGNOSIS — R921 Mammographic calcification found on diagnostic imaging of breast: Secondary | ICD-10-CM

## 2023-10-23 ENCOUNTER — Encounter: Payer: Self-pay | Admitting: Internal Medicine

## 2023-10-23 ENCOUNTER — Ambulatory Visit: Payer: 59 | Attending: Internal Medicine | Admitting: Internal Medicine

## 2023-10-23 VITALS — BP 120/64 | HR 56 | Ht 63.0 in | Wt 117.8 lb

## 2023-10-23 DIAGNOSIS — E785 Hyperlipidemia, unspecified: Secondary | ICD-10-CM | POA: Diagnosis not present

## 2023-10-23 DIAGNOSIS — I340 Nonrheumatic mitral (valve) insufficiency: Secondary | ICD-10-CM

## 2023-10-23 DIAGNOSIS — E7800 Pure hypercholesterolemia, unspecified: Secondary | ICD-10-CM

## 2023-10-23 DIAGNOSIS — I10 Essential (primary) hypertension: Secondary | ICD-10-CM | POA: Diagnosis not present

## 2023-10-23 DIAGNOSIS — R002 Palpitations: Secondary | ICD-10-CM

## 2023-10-23 DIAGNOSIS — I471 Supraventricular tachycardia, unspecified: Secondary | ICD-10-CM | POA: Diagnosis not present

## 2023-10-23 DIAGNOSIS — E0500 Thyrotoxicosis with diffuse goiter without thyrotoxic crisis or storm: Secondary | ICD-10-CM

## 2023-10-23 NOTE — Progress Notes (Signed)
 Cardiology Office Note:  .   Date:  10/23/2023  ID:  Victoria Cardenas, DOB 1951/04/18, MRN [id number] PCP: Rik Glinda DASEN, FNP  [facility] Providers Cardiologist:  Soyla DELENA Merck, MD    History of Present Illness: .   Victoria Cardenas is a 73 y.o. female.  Discussed the use of AI scribe software for clinical note transcription with the patient, who gave verbal consent to proceed.  History of Present Illness Victoria Cardenas is a 73 year old female with hypertension and palpitations who presents for cardiovascular evaluation.  She has fluctuating blood pressure readings at home, often linked to increased salt intake and stress. She manages her hypertension through lifestyle modifications, including dietary changes and stress management. She has experienced adverse reactions to propranolol  and generic valsartan , which caused vomiting. She was only able to take brand name Diovan  before the recall.  Her palpitations are characterized by an irregular heartbeat and occasional lightheadedness, sometimes accompanied by mild dyspnea. A heart monitor evaluation five years ago showed SVT.  She feels the symptoms come and go but may not have significantly changed from prior.  I have offered hera cardiac monitor today and she defers and  favor of observation.    Her cholesterol levels are elevated, and she is managing this through dietary changes, reducing red meat intake, and focusing on chicken and [place]. She avoids fast food and is mindful of limiting added sugars and increasing fiber intake.  She has no coronary artery disease on coronary CT from 2022, no calcium .    ROS: negative except per HPI above.  Studies Reviewed: SABRA   EKG Interpretation Date/Time:  Friday October 23 2023 09:12:30 EDT Ventricular Rate:  58 PR Interval:  122 QRS Duration:  70 QT Interval:  440 QTC Calculation: 431 R Axis:   61  Text Interpretation: Sinus bradycardia Right atrial enlargement When compared with ECG  of 05-Dec-2020 15:20, No significant change was found Confirmed by Merck Soyla ([id number]) on 10/23/2023 9:31:43 AM    Results RADIOLOGY Coronary CTA: No coronary artery disease, no coronary calcifications (2022)  DIAGNOSTIC Echocardiogram: Mild mitral valve regurgitation (2020) EKG: Normal (10/23/2023) Risk Assessment/Calculations:       Physical Exam:   VS:  BP 120/64   Pulse (!) 56   Ht 5' 3 (1.6 m)   Wt 117 lb 12.8 oz (53.4 kg)   SpO2 98%   BMI 20.87 kg/m    Wt Readings from Last 3 Encounters:  10/23/23 117 lb 12.8 oz (53.4 kg)  05/29/23 119 lb (54 kg)  05/13/23 120 lb 9.6 oz (54.7 kg)     Physical Exam GENERAL: Alert, cooperative, well developed, no acute distress. HEENT: Normocephalic, normal oropharynx, moist mucous membranes. CHEST: Clear to auscultation bilaterally, no wheezes, rhonchi, or crackles. CARDIOVASCULAR: Normal heart rate and rhythm, S1 and S2 normal without murmurs. Neck vessels normal. ABDOMEN: Soft, non-tender, non-distended, without organomegaly, normal bowel sounds. EXTREMITIES: No cyanosis or edema. NEUROLOGICAL: Cranial nerves grossly intact, moves all extremities without gross motor or sensory deficit.   ASSESSMENT AND PLAN: .    Assessment and Plan Assessment & Plan Palpitations History of Graves' disease -Recent TSH normal Intermittent palpitations with occasional lightheadedness and dyspnea. Current EKG normal.  - Observation and lifestyle modifications per patient preference. - Encouraged hydration. - Advised to avoid caffeine. - Monitor symptoms and report changes.  Order cardiac monitor if patient has concerning symptoms.  Atrial fibrillation previously evaluated and felt to be more likely SVT.  Mild mitral valve regurgitation - Repeat echocardiogram in one year to monitor.  Hypertension Hypertension well-controlled today. Reports fluctuations at home, possibly due to dietary salt and stress. Diovan  previously discontinued due  to recall, and adverse effects with generic valsartan . - Monitor blood pressure at home. - Manage stress and dietary salt intake. - Consider restarting Diovan  if blood pressure worsens and insurance covers.  Elevated cholesterol Cholesterol elevated, no coronary artery disease on previous coronary CTA. History of medication intolerances.  LDL 141 - Implement dietary changes to reduce cholesterol, focusing on reducing red meat and increasing fiber and protein intake.  Consider low-dose statin or Zetia  if cholesterol remains uncontrolled at next check.       Soyla Merck, MD, FACC

## 2023-10-23 NOTE — Patient Instructions (Addendum)
 Medication Instructions:  No Changes *If you need a refill on your cardiac medications before your next appointment, please call your pharmacy*  Lab Work: None  Testing/Procedures: Your physician has requested that you have an echocardiogram, to be done in June 2026 prior to your one year follow up visit with Dr. Acharya or Physician's Assistant/Nurse Practitioner. Echocardiography is a painless test that uses sound waves to create images of your heart. It provides your doctor with information about the size and shape of your heart and how well your heart's chambers and valves are working. This procedure takes approximately one hour. There are no restrictions for this procedure. Please do NOT wear cologne, perfume, aftershave, or lotions (deodorant is allowed). Please arrive 15 minutes prior to your appointment time.  Please note: We ask at that you not bring children with you during ultrasound (echo/ vascular) testing. Due to room size and safety concerns, children are not allowed in the ultrasound rooms during exams. Our front office staff cannot provide observation of children in our lobby area while testing is being conducted. An adult accompanying a patient to their appointment will only be allowed in the ultrasound room at the discretion of the ultrasound technician under special circumstances. We apologize for any inconvenience.   Follow-Up: At [facility], you and your health needs are our priority.  As part of our continuing mission to provide you with exceptional heart care, our providers are all part of one team.  This team includes your primary Cardiologist (physician) and Advanced Practice Providers or APPs (Physician Assistants and Nurse Practitioners) who all work together to provide you with the care you need, when you need it.  Your next appointment:   1 year(s) (after your Echocardiogram); we will mail a reminder letter; please call to schedule appointment.  Provider:    Soyla DELENA Merck, MD or Orren Fabry, PA, or Dayna Dunn, PA, or [place], [place], or Damien Braver, NP   Other Instructions      Mediterranean Diet  Why follow it? Research shows. Those who follow the Mediterranean diet have a reduced risk of heart disease  The diet is associated with a reduced incidence of Parkinson's and Alzheimer's diseases People following the diet may have longer life expectancies and lower rates of chronic diseases  The Dietary Guidelines for Americans recommends the Mediterranean diet as an eating plan to promote health and prevent disease  What Is the Mediterranean Diet?  Healthy eating plan based on typical foods and recipes of Mediterranean-style cooking The diet is primarily a plant based diet; these foods should make up a majority of meals   Starches - Plant based foods should make up a majority of meals - They are an important sources of vitamins, minerals, energy, antioxidants, and fiber - Choose whole grains, foods high in fiber and minimally processed items  - Typical grain sources include wheat, oats, barley, corn, brown rice, bulgar, farro, millet, polenta, couscous  - Various types of beans include chickpeas, lentils, fava beans, black beans, white beans   Fruits  Veggies - Large quantities of antioxidant rich fruits & veggies; 6 or more servings  - Vegetables can be eaten raw or lightly drizzled with oil and cooked  - Vegetables common to the traditional Mediterranean Diet include: artichokes, arugula, beets, broccoli, brussel sprouts, cabbage, carrots, celery, collard greens, cucumbers, eggplant, kale, leeks, lemons, lettuce, mushrooms, okra, onions, peas, peppers, potatoes, pumpkin, radishes, rutabaga, shallots, spinach, sweet potatoes, turnips, zucchini - Fruits common to the Mediterranean  Diet include: apples, apricots, avocados, cherries, clementines, dates, figs, grapefruits, grapes, melons, nectarines, oranges, peaches, pears, pomegranates,  strawberries, tangerines  Fats - Replace butter and margarine with healthy oils, such as olive oil, canola oil, and tahini  - Limit nuts to no more than a handful a day  - Nuts include walnuts, almonds, pecans, pistachios, pine nuts  - Limit or avoid candied, honey roasted or heavily salted nuts - Olives are central to the [employer] - can be eaten whole or used in a variety of dishes   Meats Protein - Limiting red meat: no more than a few times a month - When eating red meat: choose lean cuts and keep the portion to the size of deck of cards - Eggs: approx. 0 to 4 times a week  - Fish and lean poultry: at least 2 a week  - Healthy protein sources include, chicken, [place], lean beef, lamb - Increase intake of seafood such as tuna, salmon, trout, mackerel, shrimp, scallops - Avoid or limit high fat processed meats such as sausage and bacon  Dairy - Include moderate amounts of low fat dairy products  - Focus on healthy dairy such as fat free yogurt, skim milk, low or reduced fat cheese - Limit dairy products higher in fat such as whole or 2% milk, cheese, ice cream  Alcohol - Moderate amounts of red wine is ok  - No more than 5 oz daily for women (all ages) and men older than age 77  - No more than 10 oz of wine daily for men younger than 74  Other - Limit sweets and other desserts  - Use herbs and spices instead of salt to flavor foods  - Herbs and spices common to the traditional Mediterranean Diet include: basil, bay leaves, chives, cloves, cumin, fennel, garlic, lavender, marjoram, mint, oregano, parsley, pepper, rosemary, sage, savory, sumac, tarragon, thyme   It's not just a diet, it's a lifestyle:  The Mediterranean diet includes lifestyle factors typical of those in the region  Foods, drinks and meals are best eaten with others and savored Daily physical activity is important for overall good health This could be strenuous exercise like running and aerobics This could also  be more leisurely activities such as walking, housework, yard-work, or taking the stairs Moderation is the key; a balanced and healthy diet accommodates most foods and drinks Consider portion sizes and frequency of consumption of certain foods   Meal Ideas & Options:  Breakfast:  Whole wheat toast or whole wheat English muffins with peanut butter & hard boiled egg Steel cut oats topped with apples & cinnamon and skim milk  Fresh fruit: banana, strawberries, melon, berries, peaches  Smoothies: strawberries, bananas, greek yogurt, peanut butter Low fat greek yogurt with blueberries and granola  Egg white omelet with spinach and mushrooms Breakfast couscous: whole wheat couscous, apricots, skim milk, cranberries  Sandwiches:  Hummus and grilled vegetables (peppers, zucchini, squash) on whole wheat bread   Grilled chicken on whole wheat pita with lettuce, tomatoes, cucumbers or tzatziki  [place] salad on whole wheat bread: tuna salad made with greek yogurt, olives, red peppers, capers, green onions Garlic rosemary lamb pita: lamb sauted with garlic, rosemary, salt & pepper; add lettuce, cucumber, greek yogurt to pita - flavor with lemon juice and black pepper  Seafood:  Mediterranean grilled salmon, seasoned with garlic, basil, parsley, lemon juice and black pepper Shrimp, lemon, and spinach whole-grain pasta salad made with low fat greek yogurt  Seared scallops  with lemon orzo  Seared tuna steaks seasoned salt, pepper, coriander topped with tomato mixture of olives, tomatoes, olive oil, minced garlic, parsley, green onions and cappers  Meats:  Herbed greek chicken salad with kalamata olives, cucumber, feta  Red bell peppers stuffed with spinach, bulgur, lean ground beef (or lentils) & topped with feta   Kebabs: skewers of chicken, tomatoes, onions, zucchini, squash  [place] burgers: made with red onions, mint, dill, lemon juice, feta cheese topped with roasted red peppers Vegetarian Cucumber  salad: cucumbers, artichoke hearts, celery, red onion, feta cheese, tossed in olive oil & lemon juice  Hummus and whole grain pita points with a greek salad (lettuce, tomato, feta, olives, cucumbers, red onion) Lentil soup with celery, carrots made with vegetable broth, garlic, salt and pepper  Tabouli salad: parsley, bulgur, mint, scallions, cucumbers, tomato, radishes, lemon juice, olive oil, salt and pepper.

## 2023-11-11 ENCOUNTER — Other Ambulatory Visit: Payer: Self-pay

## 2023-11-11 ENCOUNTER — Emergency Department (HOSPITAL_COMMUNITY)
Admission: EM | Admit: 2023-11-11 | Discharge: 2023-11-11 | Disposition: A | Discharge status: Home / Self Care | Attending: Emergency Medicine | Admitting: Emergency Medicine

## 2023-11-11 ENCOUNTER — Emergency Department (HOSPITAL_COMMUNITY)

## 2023-11-11 DIAGNOSIS — Z79899 Other long term (current) drug therapy: Secondary | ICD-10-CM | POA: Insufficient documentation

## 2023-11-11 DIAGNOSIS — R42 Dizziness and giddiness: Secondary | ICD-10-CM | POA: Insufficient documentation

## 2023-11-11 DIAGNOSIS — Z853 Personal history of malignant neoplasm of breast: Secondary | ICD-10-CM | POA: Diagnosis not present

## 2023-11-11 DIAGNOSIS — I10 Essential (primary) hypertension: Secondary | ICD-10-CM | POA: Insufficient documentation

## 2023-11-11 DIAGNOSIS — H409 Unspecified glaucoma: Secondary | ICD-10-CM | POA: Diagnosis not present

## 2023-11-11 DIAGNOSIS — R202 Paresthesia of skin: Secondary | ICD-10-CM | POA: Diagnosis not present

## 2023-11-11 LAB — DIFFERENTIAL
Abs Immature Granulocytes: 0.01 K/uL (ref 0.00–0.07)
Basophils Absolute: 0 K/uL (ref 0.0–0.1)
Basophils Relative: 0 %
Eosinophils Absolute: 0.1 K/uL (ref 0.0–0.5)
Eosinophils Relative: 2 %
Immature Granulocytes: 0 %
Lymphocytes Relative: 28 %
Lymphs Abs: 1.7 K/uL (ref 0.7–4.0)
Monocytes Absolute: 0.5 K/uL (ref 0.1–1.0)
Monocytes Relative: 9 %
Neutro Abs: 3.7 K/uL (ref 1.7–7.7)
Neutrophils Relative %: 61 %

## 2023-11-11 LAB — COMPREHENSIVE METABOLIC PANEL WITH GFR
ALT: 17 U/L (ref 0–44)
AST: 21 U/L (ref 15–41)
Albumin: 3.7 g/dL (ref 3.5–5.0)
Alkaline Phosphatase: 36 U/L — ABNORMAL LOW (ref 38–126)
Anion gap: 10 (ref 5–15)
BUN: 8 mg/dL (ref 8–23)
CO2: 25 mmol/L (ref 22–32)
Calcium: 8.8 mg/dL — ABNORMAL LOW (ref 8.9–10.3)
Chloride: 104 mmol/L (ref 98–111)
Creatinine, Ser: 0.78 mg/dL (ref 0.44–1.00)
GFR, Estimated: >60 mL/min (ref >60)
Glucose, Bld: 116 mg/dL — ABNORMAL HIGH (ref 70–99)
Potassium: 3.9 mmol/L (ref 3.5–5.1)
Sodium: 139 mmol/L (ref 135–145)
Total Bilirubin: 1.3 mg/dL — ABNORMAL HIGH (ref 0.0–1.2)
Total Protein: 6.5 g/dL (ref 6.5–8.1)

## 2023-11-11 LAB — I-STAT CHEM 8, ED
BUN: 8 mg/dL (ref 8–23)
Calcium, Ion: 1.12 mmol/L — ABNORMAL LOW (ref 1.15–1.40)
Chloride: 103 mmol/L (ref 98–111)
Creatinine, Ser: 0.8 mg/dL (ref 0.44–1.00)
Glucose, Bld: 112 mg/dL — ABNORMAL HIGH (ref 70–99)
HCT: 43 % (ref 36.0–46.0)
Hemoglobin: 14.6 g/dL (ref 12.0–15.0)
Potassium: 3.9 mmol/L (ref 3.5–5.1)
Sodium: 140 mmol/L (ref 135–145)
TCO2: 24 mmol/L (ref 22–32)

## 2023-11-11 LAB — PROTIME-INR
INR: 1.1 (ref 0.8–1.2)
Prothrombin Time: 15.2 s (ref 11.4–15.2)

## 2023-11-11 LAB — CBC
HCT: 43.2 % (ref 36.0–46.0)
Hemoglobin: 14.4 g/dL (ref 12.0–15.0)
MCH: 31.6 pg (ref 26.0–34.0)
MCHC: 33.3 g/dL (ref 30.0–36.0)
MCV: 94.9 fL (ref 80.0–100.0)
Platelets: 219 K/uL (ref 150–400)
RBC: 4.55 MIL/uL (ref 3.87–5.11)
RDW: 11.8 % (ref 11.5–15.5)
WBC: 6.1 K/uL (ref 4.0–10.5)
nRBC: 0 % (ref 0.0–0.2)

## 2023-11-11 LAB — APTT: aPTT: 33 s (ref 24–36)

## 2023-11-11 LAB — ETHANOL: Alcohol, Ethyl (B): <15 mg/dL (ref <15)

## 2023-11-11 LAB — CBG MONITORING, ED: Glucose-Capillary: 91 mg/dL (ref 70–99)

## 2023-11-11 MED ORDER — MECLIZINE HCL 25 MG PO TABS: 25.0000 mg | ORAL_TABLET | Freq: Three times a day (TID) | ORAL | 0 refills | Status: AC | PRN

## 2023-11-11 MED ORDER — MECLIZINE HCL 25 MG PO TABS
25.0000 mg | ORAL_TABLET | Freq: Once | ORAL | Status: AC
  Administered 2023-11-11: 25 mg via ORAL
  Filled 2023-11-11: qty 1

## 2023-11-11 MED ORDER — SODIUM CHLORIDE 0.9% FLUSH
3.0000 mL | Freq: Once | INTRAVENOUS | Status: AC
  Administered 2023-11-11: 3 mL via INTRAVENOUS

## 2023-11-11 MED ORDER — SODIUM CHLORIDE 0.9 % IV BOLUS
500.0000 mL | Freq: Once | INTRAVENOUS | Status: AC
  Administered 2023-11-11: 500 mL via INTRAVENOUS

## 2023-11-11 NOTE — ED Triage Notes (Addendum)
 Pt. Stated, pt. Stated, my BP has been going up and this morning when I got up I was dizzy. This is new. My BP will go up and down but the dizziness is new.also I have some tingling in left fingers.   No arm drift. = grips , symmetrical smile. Alert and oriented x 4

## 2023-11-11 NOTE — ED Notes (Signed)
 CCMD Called

## 2023-11-11 NOTE — ED Provider Notes (Signed)
 Victoria Cardenas EMERGENCY DEPARTMENT AT [facility] Provider Note   CSN: [id number] Arrival date & time: 11/11/23  0941     Patient presents with: Dizziness   Victoria Cardenas is a 73 y.o. female.   Pt is a 73 yo female with pmhx significant for htn, glaucoma, hld, svt and hx right breast cancer.   Pt complains of dizziness.  Pt went to bed around 2100 last night and was fine.  She woke up this am and was dizzy.  She has a little tingling in her left fingers.  She denies difficulty swallowing or talking.  She is able to ambulate.        Prior to Admission medications   Medication Sig Start Date End Date Taking? Authorizing Provider  meclizine  (ANTIVERT ) 25 MG tablet Take 1 tablet (25 mg total) by mouth 3 (three) times daily as needed for dizziness. 11/11/23  Yes Babak Lucus, MD  brimonidine (ALPHAGAN P) 0.1 % SOLN 1 drop in both eyes daily    [provider]  cholecalciferol (VITAMIN D3) 25 MCG (1000 UT) tablet Take 1,000 Units by mouth daily.    [provider]  LUMIGAN 0.01 % SOLN Place 1 drop into both eyes at bedtime. 05/14/18   [provider]  methimazole  (TAPAZOLE ) 5 MG tablet Take 0.5 tablets (2.5 mg total) by mouth daily. 06/01/23   Shamleffer, Ibtehal Jaralla, MD  Multiple Minerals-Vitamins (CALCIUM -MAGNESIUM-ZINC-D3) TABS Take by mouth. 1 daily    [provider]  valsartan  (DIOVAN ) 40 MG tablet Take 40 mg by mouth daily.    [provider]    Allergies: Amlodipine, Penicillins, Alendronate , Diltiazem , Lisinopril , Losartan , Propranolol , Rosuvastatin , Ciprofloxacin, and Simvastatin    Review of Systems  Neurological:  Positive for dizziness.  All other systems reviewed and are negative.   Updated Vital Signs BP (!) 137/90   Pulse 63   Temp 97.6 F (36.4 C)   Resp 16   SpO2 100%   Physical Exam Vitals and nursing note reviewed.  Constitutional:      Appearance: Normal appearance.  HENT:     Head: Normocephalic  and atraumatic.     Right Ear: External ear normal.     Left Ear: External ear normal.     Nose: Nose normal.     Mouth/Throat:     Mouth: Mucous membranes are moist.     Pharynx: Oropharynx is clear.  Eyes:     Extraocular Movements: Extraocular movements intact.     Conjunctiva/sclera: Conjunctivae normal.     Pupils: Pupils are equal, round, and reactive to light.  Cardiovascular:     Rate and Rhythm: Normal rate and regular rhythm.     Pulses: Normal pulses.     Heart sounds: Normal heart sounds.  Pulmonary:     Effort: Pulmonary effort is normal.     Breath sounds: Normal breath sounds.  Abdominal:     General: Abdomen is flat. Bowel sounds are normal.     Palpations: Abdomen is soft.  Musculoskeletal:        General: Normal range of motion.     Cervical back: Normal range of motion and neck supple.  Skin:    General: Skin is warm.     Capillary Refill: Capillary refill takes less than 2 seconds.  Neurological:     General: No focal deficit present.     Mental Status: She is alert and oriented to person, place, and time.  Psychiatric:  Mood and Affect: Mood normal.        Behavior: Behavior normal.        Thought Content: Thought content normal.        Judgment: Judgment normal.     (all labs ordered are listed, but only abnormal results are displayed) Labs Reviewed  COMPREHENSIVE METABOLIC PANEL WITH GFR - Abnormal; Notable for the following components:      Result Value   Glucose, Bld 116 (*)    Calcium  8.8 (*)    Alkaline Phosphatase 36 (*)    Total Bilirubin 1.3 (*)    All other components within normal limits  I-STAT CHEM 8, ED - Abnormal; Notable for the following components:   Glucose, Bld 112 (*)    Calcium , Ion 1.12 (*)    All other components within normal limits  PROTIME-INR  APTT  CBC  DIFFERENTIAL  ETHANOL  CBG MONITORING, ED    EKG: None  Radiology: MR BRAIN WO CONTRAST Result Date: 11/11/2023 CLINICAL DATA:  Provided history:  Neuro deficit, acute, stroke suspected. EXAM: MRI HEAD WITHOUT CONTRAST TECHNIQUE: Multiplanar, multiecho pulse sequences of the brain and surrounding structures were obtained without intravenous contrast. COMPARISON:  Head CT 11/11/2023. FINDINGS: Brain: No age-advanced or lobar predominant cerebral atrophy. Multifocal T2 FLAIR hyperintense signal abnormality within the cerebral white matter, nonspecific but compatible with mild chronic small vessel ischemic disease. No cortical encephalomalacia is identified. There is no acute infarct. No evidence of an intracranial mass. No chronic intracranial blood products. No extra-axial fluid collection. No midline shift. Vascular: Maintained flow voids within the proximal large arterial vessels. Skull and upper cervical spine: No focal worrisome marrow lesion. Sinuses/Orbits: No mass or acute finding within the imaged orbits. Minimal mucosal thickening, and small mucous retention cyst, within the right maxillary sinus. Minimal mucosal thickening within the left ethmoid and left frontal sinuses. IMPRESSION: 1. No evidence of an acute intracranial abnormality. 2. Mild chronic small vessel ischemic changes within the cerebral white matter. 3. Mild paranasal sinus disease as described. Electronically Signed   By: Rockey Childs D.O.   On: 11/11/2023 15:48   CT HEAD WO CONTRAST Result Date: 11/11/2023 CLINICAL DATA:  73 year old female with syncope, labile blood pressure, dizziness. EXAM: CT HEAD WITHOUT CONTRAST TECHNIQUE: Contiguous axial images were obtained from the base of the skull through the vertex without intravenous contrast. RADIATION DOSE REDUCTION: This exam was performed according to the departmental dose-optimization program which includes automated exposure control, adjustment of the mA and/or kV according to patient size and/or use of iterative reconstruction technique. COMPARISON:  Head CT 08/04/2020. FINDINGS: Brain: Normal cerebral volume for age. No midline  shift, ventriculomegaly, mass effect, evidence of mass lesion, intracranial hemorrhage or evidence of cortically based acute infarction. Chronic basal ganglia vascular calcifications. Gray-white differentiation is stable since 2022 and within normal limits for age. Vascular: No suspicious intracranial vascular hyperdensity. Calcified atherosclerosis at the skull base. Skull: Stable.  No acute osseous abnormality identified. Sinuses/Orbits: Visualized paranasal sinuses and mastoids are stable and well aerated. Other: Visualized orbits and scalp soft tissues are within normal limits. IMPRESSION: Stable and normal for age noncontrast CT appearance of the brain. Electronically Signed   By: VEAR Hurst M.D.   On: 11/11/2023 11:20     Procedures   Medications Ordered in the ED  sodium chloride  flush (NS) 0.9 % injection 3 mL (3 mLs Intravenous Given 11/11/23 1629)  meclizine  (ANTIVERT ) tablet 25 mg (25 mg Oral Given 11/11/23 1620)  sodium chloride  0.9 %  bolus 500 mL (500 mLs Intravenous New Bag/Given 11/11/23 1628)                                    Medical Decision Making Amount and/or Complexity of Data Reviewed Labs: ordered. Radiology: ordered.   This patient presents to the ED for concern of dizziness, this involves an extensive number of treatment options, and is a complaint that carries with it a high risk of complications and morbidity.  The differential diagnosis includes vertigo, orthostatic hypotension, cva   Co morbidities that complicate the patient evaluation  htn, glaucoma, hld, svt and hx right breast cancer   Additional history obtained:  Additional history obtained from epic chart review External records from outside source obtained and reviewed including son   Lab Tests:  I Ordered, and personally interpreted labs.  The pertinent results include:  cbc nl, cmp nl, inr nl, etoh neg   Imaging Studies ordered:  I ordered imaging studies including ct head, mri brain  I  independently visualized and interpreted imaging which showed  CT head: Stable and normal for age noncontrast CT appearance of the brain.  MRI brain:  No evidence of an acute intracranial abnormality.  2. Mild chronic small vessel ischemic changes within the cerebral  white matter.  3. Mild paranasal sinus disease as described.   I agree with the radiologist interpretation   Cardiac Monitoring:  The patient was maintained on a cardiac monitor.  I personally viewed and interpreted the cardiac monitored which showed an underlying rhythm of: sb   Medicines ordered and prescription drug management:  I ordered medication including ivfs/antivert   for sx  Reevaluation of the patient after these medicines showed that the patient improved I have reviewed the patients home medicines and have made adjustments as needed   Test Considered:  Ct/mri   Critical Interventions:  mri   Problem List / ED Course:  Dizziness: likely vertigo.  Pt is feeling better.  She is stable for d/c.  Return if worse.    Reevaluation:  After the interventions noted above, I reevaluated the patient and found that they have :improved   Social Determinants of Health:  Lives at home   Dispostion:  After consideration of the diagnostic results and the patients response to treatment, I feel that the patent would benefit from discharge with outpatient f/u.       Final diagnoses:  Dizziness    ED Discharge Orders          Ordered    meclizine  (ANTIVERT ) 25 MG tablet  3 times daily PRN        11/11/23 1612               Wendy Hoback, MD 11/11/23 1700

## 2023-11-11 NOTE — ED Provider Triage Note (Signed)
 Emergency Medicine Provider Triage Evaluation Note  Victoria Cardenas , a 73 y.o. female  was evaluated in triage.  Pt complains of dizziness.  Pt went to bed around 2100 last night and was fine.  She woke up this am and was dizzy.  She has a little tingling in her left fingers.  She denies difficulty swallowing or talking.  She is able to ambulate.  Review of Systems  Positive: Dizziness, left hand tingling Negative: No slurred speech or weakness  Physical Exam  BP 120/79 (BP Location: Left Arm)   Pulse 69   Temp 98.2 F (36.8 C) (Oral)   Resp (!) 26   SpO2 99%  Gen:   Awake, no distress   Resp:  Normal effort  MSK:   Moves extremities without difficulty  Other:  Dizziness sensation.  No other neurologic sx.  Medical Decision Making  Medically screening exam initiated at 11:34 AM.  Appropriate orders placed.  SYDNEI OHAVER was informed that the remainder of the evaluation will be completed by another provider, this initial triage assessment does not replace that evaluation, and the importance of remaining in the ED until their evaluation is complete.  Code stroke not called due to LNK at 2100 and sx only of dizziness.  Stroke work up ordered as well as fluids for possible orthostasis and antivert  for vertigo.   Dean Clarity, MD 11/11/23 1137

## 2024-01-21 ENCOUNTER — Telehealth: Payer: Self-pay | Admitting: Internal Medicine

## 2024-01-21 NOTE — Telephone Encounter (Signed)
 Scheduled patient for visit 01/22/24 with Victoria Beauvais NP Asked that she bring home BP cuff to visit for accuracy check

## 2024-01-21 NOTE — Progress Notes (Unsigned)
 Cardiology Clinic Note   Patient Name: Victoria Cardenas Date of Encounter: 01/22/2024  Primary Care Provider:  Rik Glinda DASEN, FNP Primary Cardiologist:  Soyla DELENA Merck, MD  Patient Profile    Victoria Cardenas 73 year old female presents to the clinic today for evaluation of her blood pressure.  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    Allergies  Allergies  Allergen Reactions   Amlodipine Palpitations and Swelling    Swelling, heart pounding  Other Reaction(s): swelling, palpitations   Penicillins Hives    Has patient had a PCN reaction causing immediate rash, facial/tongue/throat swelling, SOB or lightheadedness with hypotension: yes  Has patient had a PCN reaction causing severe rash involving mucus membranes or skin necrosis: no  Has patient had a PCN reaction that required hospitalization: no  Has patient had a PCN reaction occurring within the last 10 years: no  If all of the above answers are NO, then may proceed with Cephalosporin  Other Reaction(s): hives, throat swelling per patient   Alendronate  Other (See Comments)    Nausea and diarrhea    Diltiazem  Other (See Comments)    Pt reports this med causes her fatigue, dizziness, lightheaded, sore throat, and hard swallowing meals.  Other Reaction(s): sore throat, dizziness   Lisinopril      Patient refused prior rx due to potential side effects outlined in medication  information  Other Reaction(s): did not take due to concerns about possible side effects   Losartan  Other (See Comments)    Pt reports causes frequent urination and UTIs  Other Reaction(s): frequent urination, UTI   Propranolol  Other (See Comments)    Pt reports causes flushing  Other Reaction(s): flushing   Rosuvastatin  Other (See Comments)    Pt reports caused her to feel nauseated, dizzy, and lightheaded.    Ciprofloxacin Palpitations    Other Reaction(s): palpitations   Simvastatin Other (See Comments)    Increased urination    History of Present Illness    Victoria Cardenas has a PMH of hyperlipidemia, SVT, hypertension, palpitations, mitral valve insufficiency, and Graves' disease.  Echocardiogram 10/05/2018 showed an LVEF of 60-65%, mild thickening of mitral valve leaflet with mild regurgitation and no other significant valvular abnormalities.  Nuclear stress testing 05/20/2011 showed normal stress test and EF greater than 65%.  She was seen in follow-up by Dr. Merck on 10/23/2023.  During that time she noted fluctuating blood pressures at home.  This was associated with sodium intake and stress.  She was managing her blood pressure with lifestyle modification, dietary changes, and stress management.  She noted reactions to propranolol  and valsartan .  These caused vomiting.  She noted irregular heartbeat and occasional lightheadedness that was sometimes accompanied by mild dyspnea.  She wore a heart rate monitor 5 years prior which showed SVT.  She felt that her  symptoms have been  significantly changed from previous.  Cardiac event monitor was offered but she wished to defer.  Echocardiogram was ordered but not completed.  She contacted the nurse triage line yesterday and reported that her PCP had stopped her valsartan  and clonidine.  She reported that she was avoiding salt in her diet and drinking 8 glasses of water daily.  She was eating 2 meals daily.  Her blood pressure was 80s  systolic over 40s-50s diastolic.  She noted that her blood pressure was elevated at the dentist.  However, she notes that when her blood pressure is low she has trouble with her breathing.  She occasionally notices changes with her vision and lightheadedness.  She was added to my schedule for today.  She states she was taken off of her blood pressure medication by her PCP.  Her blood pressure today is 122/75.  She reports that when she was on clonidine and valsartan  she felt very lethargic and dizzy.  She contacted EMS who came out and did an EKG.  They reported that her EKG was normal.  She notes that since changing her diet she has lost some weight.  This has been intentional.  Since her blood pressure is normalized she has had no further episodes of lightheadedness or dizziness.  We reviewed recommendations for upcoming echocardiogram.  She expressed understanding.  I will continue her current medication regimen and plan follow-up after echocardiogram.  Today she denies chest pain, shortness of breath, lower extremity edema, fatigue, palpitations, melena, hematuria, hemoptysis, diaphoresis, weakness, presyncope, syncope, orthopnea, and PND.    Home Medications    Prior to Admission medications   Medication Sig Start Date End Date Taking? Authorizing Provider  brimonidine (ALPHAGAN P) 0.1 % SOLN 1 drop in both eyes daily    [provider]  cholecalciferol (VITAMIN D3) 25 MCG (1000 UT) tablet Take 1,000 Units by mouth daily.    [provider]  LUMIGAN 0.01 % SOLN Place 1 drop into both eyes at bedtime. 05/14/18   [provider]  meclizine  (ANTIVERT ) 25 MG tablet Take 1 tablet (25 mg total) by mouth 3 (three) times daily as needed for dizziness. 11/11/23   Dean Clarity, MD  methimazole  (TAPAZOLE ) 5 MG tablet Take 0.5 tablets (2.5 mg total) by mouth daily. 06/01/23   Shamleffer, Ibtehal Jaralla, MD  Multiple Minerals-Vitamins (CALCIUM -MAGNESIUM-ZINC-D3) TABS Take by mouth.  1 daily    [provider]  valsartan  (DIOVAN ) 40 MG tablet Take 40 mg by mouth daily.    [provider]    Family History    Family History  Problem Relation Age of Onset   Hypertension Mother    Breast cancer Mother 68   Heart attack Sister    Breast cancer Maternal Grandmother    Breast cancer Maternal Aunt    She indicated that her mother is deceased. She indicated that her father is deceased. She indicated that the status of her sister is unknown. She indicated that her maternal grandmother is deceased. She indicated that her maternal grandfather is deceased. She indicated that her paternal grandmother is deceased. She indicated that her paternal grandfather is deceased. She indicated that the status of her maternal aunt is unknown.  Social History    Social History   Socioeconomic History   Marital status: Legally Separated    Spouse name: Not on file   Number of children: Not on file   Years of education: Not on file   Highest education level: Not on file  Occupational History   Not on file  Tobacco Use   Smoking status: Never   Smokeless tobacco: Never  Vaping Use   Vaping status: Never Used  Substance and Sexual Activity   Alcohol use: No   Drug use: No   Sexual activity: Not on file  Other Topics Concern   Not on file  Social History Narrative   Lives at home alone    2 children    Does not use caffeine products      MB RN 11/05/20   Social Drivers of Health   Financial Resource Strain: Not on file  Food Insecurity: Not on file  Transportation Needs: Not on file  Physical Activity: Not on file  Stress: Not on file  Social Connections: Unknown (07/08/2022)   Received from [facility]   Social Network    Social Network: Not on file  Intimate Partner Violence: Unknown (07/08/2022)   Received from [facility]   HITS    Physically Hurt: Not on file    Insult or Talk Down To: Not on file    Threaten Physical Harm: Not on file     Scream or Curse: Not on file     Review of Systems    General:  No chills, fever, night sweats or weight changes.  Cardiovascular:  No chest pain, dyspnea on exertion, edema, orthopnea, palpitations, paroxysmal nocturnal dyspnea. Dermatological: No rash, lesions/masses Respiratory: No cough, dyspnea Urologic: No hematuria, dysuria Abdominal:   No nausea, vomiting, diarrhea, bright red blood per rectum, melena, or hematemesis Neurologic:  No visual changes, wkns, changes in mental status. All other systems reviewed and are otherwise negative except as noted above.  Physical Exam    VS:  BP 122/75 (BP Location: Left Arm, Patient Position: Sitting, Cuff Size: Normal)   Pulse 69   Ht 5' 3 (1.6 m)   Wt 115 lb 12.8 oz (52.5 kg)   SpO2 96%   BMI 20.51 kg/m  , BMI Body mass index is 20.51 kg/m. GEN: Well nourished, well developed, in no acute distress. HEENT: normal. Neck: Supple, no JVD, carotid bruits, or masses. Cardiac: RRR, no murmurs, rubs, or gallops. No clubbing, cyanosis, edema.  Radials/DP/PT 2+ and equal bilaterally.  Respiratory:  Respirations regular and unlabored, clear to auscultation bilaterally. GI: Soft, nontender, nondistended, BS + x 4. MS: no deformity or atrophy. Skin: warm and dry, no rash. Neuro:  Strength and sensation are intact. Psych: Normal affect.  Accessory Clinical Findings    Recent Labs: 05/29/2023: TSH 2.16 11/11/2023: ALT 17; BUN 8; Creatinine, Ser 0.80; Hemoglobin 14.6; Platelets 219; Potassium 3.9; Sodium 140   Recent Lipid Panel    Component Value Date/Time   CHOL 207 (H) 05/13/2023 1256   TRIG 129 05/13/2023 1256   HDL 43 05/13/2023 1256   CHOLHDL 4.8 (H) 05/13/2023 1256   LDLCALC 141 (H) 05/13/2023 1256         ECG personally reviewed by me today-none today.    Echocardiogram 10/05/2018   IMPRESSIONS     1. The left ventricle has normal systolic function with an ejection  fraction of 60-65%. The cavity size was normal. Left  ventricular diastolic  parameters were normal. No evidence of left ventricular regional wall  motion abnormalities.   2. The right ventricle has normal systolic function. The cavity was  normal. There is no increase in right ventricular wall thickness. Right  ventricular systolic pressure could not be assessed.   3. Mild thickening of the mitral  valve leaflet. The MR jet is  centrally-directed.   4. Mild thickening of the aortic valve. No stenosis of the aortic valve.   FINDINGS   Left Ventricle: The left ventricle has normal systolic function, with an  ejection fraction of 60-65%. The cavity size was normal. There is no  increase in left ventricular wall thickness. Left ventricular diastolic  parameters were normal. Normal left  ventricular filling pressures No evidence of left ventricular regional  wall motion abnormalities..   Right Ventricle: The right ventricle has normal systolic function. The  cavity was normal. There is no increase in right ventricular wall  thickness. Right ventricular systolic pressure could not be assessed.   Left Atrium: Left atrial size was normal in size.   Right Atrium: Right atrial size was normal in size. Right atrial pressure  is estimated at 10 mmHg.   Interatrial Septum: No atrial level shunt detected by color flow Doppler.   Pericardium: There is no evidence of pericardial effusion.   Mitral Valve: The mitral valve is normal in structure. Mild thickening of  the mitral valve leaflet. Mitral valve regurgitation is mild by color flow  Doppler. The MR jet is centrally-directed.   Tricuspid Valve: The tricuspid valve is normal in structure. Tricuspid  valve regurgitation was not visualized by color flow Doppler.   Aortic Valve: The aortic valve is normal in structure. Mild thickening of  the aortic valve. Aortic valve regurgitation was not visualized by color  flow Doppler. There is No stenosis of the aortic valve.   Pulmonic Valve: The  pulmonic valve was grossly normal. Pulmonic valve  regurgitation is not visualized by color flow Doppler.   Venous: The inferior vena cava is normal in size with greater than 50%  respiratory variability.      Assessment & Plan   1.  Hypotension-BP today 122/75.  PCP recently discontinued her clonidine and valsartan .  Reports that she has been eating a low-sodium diet but maintaining hydration. Slightly increase sodium in diet Maintain hydration Lower extremity support stockings Abdominal binder Maintain blood pressure log-recommend upper arm cuff blood pressure monitor  SVT, palpitations-heart rate today 69.  Denies recent palpitations. Avoid triggers caffeine, chocolate, EtOH, dehydration excetra.  Mitral valve regurgitation-does note occasional episodes of dizziness.  Also reports intermittent episodes of dyspnea.  Previously offered echocardiogram however, it was not completed. Reorder echocardiogram-scheduled for January  Hyperthyroidism-currently on Tapazole . Follows with PCP  Hyperlipidemia-LDL 141 on 05/13/23. High-fiber diet Maintain physical activity Follows with PCP  Disposition: Follow-up with Dr. Loni or me in 5- 6 months.   Josefa HERO. Wlliam Grosso NP-C     01/22/2024, 1:31 PM [facility] [address] [place], [place] [address] Office [phone]    Notice: This dictation was prepared with Dragon dictation along with smaller phrase technology. Any transcriptional errors that result from this process are unintentional and may not be corrected upon review.   I spent 14 minutes examining this patient, reviewing medications, and using patient centered shared decision making involving their cardiac care.   I spent  20 minutes reviewing past medical history,  medications, and prior cardiac tests.

## 2024-01-21 NOTE — Telephone Encounter (Signed)
 Spoke with patient. Her BP has been low, fluctuating. She said PCP stopped valsartan  and clonidine - unclear why as review of chart BP was elevated. She is avoiding salt in her diet. She drinks ~ 8 glasses of water daily. She eats 2 meals daily.   84/46 HR 79 - 2:00 pm today 118/68 HR 62 - 10:51am today 84/51  HR 71 - yesterday  She has a wrist cuff - was checked with BPs at PCP office.  She said her BP has been elevated at dentist also  She said when her BP is low, she has trouble breathing. She has occasional blurry vision, lightheadedness.

## 2024-01-21 NOTE — Telephone Encounter (Signed)
 Pt c/o BP issue: STAT if pt c/o blurred vision, one-sided weakness or slurred speech.  STAT if BP is GREATER than 180/120 TODAY.  STAT if BP is LESS than 90/60 and SYMPTOMATIC TODAY  1. What is your BP concern?   BP going up and down  2. Have you taken any BP medication today?  Patient stated her PCP took her of the BP medication due to side effects  3. What are your last 5 BP readings?  84/46 HR 79 - 2:00 pm today 118/68  HR 62 - 10:51 am 84/51  HR 71 - yesterday  4. Are you having any other symptoms (ex. Dizziness, headache, blurred vision, passed out)?   Tiredness and dizziness   Patient is concerned her BP has been fluctuating.

## 2024-01-22 ENCOUNTER — Ambulatory Visit: Attending: General Practice | Admitting: General Practice

## 2024-01-22 ENCOUNTER — Encounter: Payer: Self-pay | Admitting: General Practice

## 2024-01-22 VITALS — BP 122/75 | HR 69 | Ht 63.0 in | Wt 115.8 lb

## 2024-01-22 DIAGNOSIS — I340 Nonrheumatic mitral (valve) insufficiency: Secondary | ICD-10-CM

## 2024-01-22 DIAGNOSIS — I959 Hypotension, unspecified: Secondary | ICD-10-CM

## 2024-01-22 DIAGNOSIS — E7800 Pure hypercholesterolemia, unspecified: Secondary | ICD-10-CM | POA: Diagnosis not present

## 2024-01-22 DIAGNOSIS — E0590 Thyrotoxicosis, unspecified without thyrotoxic crisis or storm: Secondary | ICD-10-CM

## 2024-01-22 DIAGNOSIS — I471 Supraventricular tachycardia, unspecified: Secondary | ICD-10-CM

## 2024-01-22 NOTE — Patient Instructions (Addendum)
 Medication Instructions:  NO CHANGES   Lab Work: NONE TO BE DONE TODAY.   Testing/Procedures: TO BE SCHEDULED FOR JANUARY 2026  Your physician has requested that you have an echocardiogram. Echocardiography is a painless test that uses sound waves to create images of your heart. It provides your doctor with information about the size and shape of your heart and how well your heart's chambers and valves are working. This procedure takes approximately one hour. There are no restrictions for this procedure. Please do NOT wear cologne, perfume, aftershave, or lotions (deodorant is allowed). Please arrive 15 minutes prior to your appointment time.  Please note: We ask at that you not bring children with you during ultrasound (echo/ vascular) testing. Due to room size and safety concerns, children are not allowed in the ultrasound rooms during exams. Our front office staff cannot provide observation of children in our lobby area while testing is being conducted. An adult accompanying a patient to their appointment will only be allowed in the ultrasound room at the discretion of the ultrasound technician under special circumstances. We apologize for any inconvenience.   Follow-Up: At [facility], you and your health needs are our priority.  As part of our continuing mission to provide you with exceptional heart care, our providers are all part of one team.  This team includes your primary Cardiologist (physician) and Advanced Practice Providers or APPs (Physician Assistants and Nurse Practitioners) who all work together to provide you with the care you need, when you need it.  Your next appointment:   POST ECHOCARDIOGRAM   Provider:   Soyla DELENA Merck, MD OR Josefa Beauvais, NP    Other Instructions:

## 2024-01-27 ENCOUNTER — Encounter

## 2024-02-04 ENCOUNTER — Other Ambulatory Visit: Payer: Self-pay | Admitting: Family

## 2024-02-04 ENCOUNTER — Ambulatory Visit
Admission: RE | Admit: 2024-02-04 | Discharge: 2024-02-04 | Disposition: A | Source: Ambulatory Visit | Attending: Family | Admitting: Family

## 2024-02-04 DIAGNOSIS — R921 Mammographic calcification found on diagnostic imaging of breast: Secondary | ICD-10-CM

## 2024-02-08 ENCOUNTER — Ambulatory Visit
Admission: RE | Admit: 2024-02-08 | Discharge: 2024-02-08 | Disposition: A | Source: Ambulatory Visit | Attending: Family | Admitting: Family

## 2024-02-08 ENCOUNTER — Ambulatory Visit
Admission: RE | Admit: 2024-02-08 | Discharge: 2024-02-08 | Disposition: A | Discharge status: Home / Self Care | Source: Ambulatory Visit | Attending: Family | Admitting: Family

## 2024-02-08 DIAGNOSIS — R921 Mammographic calcification found on diagnostic imaging of breast: Secondary | ICD-10-CM

## 2024-02-08 HISTORY — PX: BREAST BIOPSY: SHX20

## 2024-02-09 LAB — SURGICAL PATHOLOGY

## 2024-02-15 ENCOUNTER — Telehealth: Payer: Self-pay | Admitting: *Deleted

## 2024-02-15 ENCOUNTER — Telehealth: Payer: Self-pay

## 2024-02-15 NOTE — Progress Notes (Signed)
 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  She denies that she had radiation, chemotherapy and she did not take any medication afterwards.  She has been followed since then.  She was a 81-month follow-up for some probably benign right breast calcifications recently.  She has B density tissue.  She had a 7 mm group group of coarse calcifications in the inner right breast that have increased in number.  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  There is necrosis present.  This was a 1% ER positive and PR negative.  She is here today to discuss her options.  She does tell me she has some valvular heart disease and follows with cardiology as well.    Review of Systems: A complete review of systems was obtained from the patient.  I have reviewed this information and discussed as appropriate with the patient.  See HPI as well for other ROS.  Review of Systems  Eyes:  Positive for redness.  Gastrointestinal:  Positive for constipation.  All other systems reviewed and are negative.    Medical History: Past Medical History:  Diagnosis Date  . Glaucoma (increased eye pressure)   . History of cancer   . Thyroid  disease     Patient Active Problem List  Diagnosis  . Acid reflux  . Ear pressure, right  . Epigastric pain  . Essential (primary) hypertension  . Eustachian tube dysfunction, right  . Graves disease  . Heart palpitations  . Hyperglycemia  . Hyperlipidemia  . Multinodular goiter  . Personal history of colonic polyps  . Right lower quadrant pain  . Sensorineural hearing loss (SNHL), bilateral  . Sore throat, chronic  . Subclinical hyperthyroidism  .  Cervical spondylosis  . Cervicalgia  . Elevated blood-pressure reading, without diagnosis of hypertension  . Hyper reflexia  . Pain in right arm    History reviewed. No pertinent surgical history.     Current Outpatient Medications on File Prior to Visit  Medication Sig Dispense Refill  . ALPHAGAN P 0.1 % ophthalmic solution Place 1 drop into both eyes 2 (two) times daily    . calcium  carbonate-vitamin D3 (OS-CAL 500+D) 500 mg-5 mcg (200 unit) tablet Take by mouth    . cloNIDine HCL (CATAPRES) 0.1 MG tablet 1 tablet Orally Once a day every evening for BP 30 days    . ezetimibe  (ZETIA ) 10 mg tablet Take 10 mg by mouth once daily    . fluticasone propionate (FLONASE) 50 mcg/actuation nasal spray Instill 2 puffs each nostril daily.    SABRA LUMIGAN 0.01 % ophthalmic solution Instill 1 drop in OU every evening    . methIMAzole  (TAPAZOLE ) 5 MG tablet     . aspirin  81 MG EC tablet Take by mouth    . potassium chloride  20 mEq/15 mL oral solution Take by mouth     No current facility-administered medications on file prior to visit.    Family History  Problem Relation Age of Onset  . Breast cancer Mother      Social History   Tobacco Use  Smoking Status Never  Smokeless Tobacco Never     Social History   Socioeconomic History  .  Marital status: Single  Tobacco Use  . Smoking status: Never  . Smokeless tobacco: Never  Vaping Use  . Vaping status: Never Used  Substance and Sexual Activity  . Alcohol use: Never  . Drug use: Never    Objective:   Vitals:   02/15/24 1022 02/15/24 1023  BP: (!) 170/89   Pulse: 62   Temp: 37 C (98.6 F)   SpO2: 99%   Weight: 54.3 kg (119 lb 12.8 oz)   Height: 160 cm (5' 3)   PainSc:    3  PainLoc:  Breast    Body mass index is 21.22 kg/m.  Physical Exam Vitals reviewed.  Constitutional:      Appearance: Normal appearance.  Chest:  Breasts:    Right: No inverted nipple, mass or nipple discharge.     Left: No inverted nipple,  mass or nipple discharge.       Comments: Right breast has a periareolar incision that has a fair amount of puckering associated with it.  The right breast is pretty significantly smaller than the left breast after her prior surgery.  This is not a great cosmetic result Lymphadenopathy:     Upper Body:     Right upper body: No supraclavicular or axillary adenopathy.     Left upper body: No supraclavicular or axillary adenopathy.  Neurological:     Mental Status: She is alert.        Assessment and Plan:     DCIS right  Right breast seed guided lumpectomy versus mastectomy, radiation oncology referral  We discussed the staging and pathophysiology of breast cancer. We discussed all of the different options for treatment for breast cancer including surgery and radiation therapy.  We discussed a sentinel lymph node biopsy which I would not recommend at this time.  We discussed the options for treatment of the breast cancer which included lumpectomy versus a mastectomy. We discussed the performance of the lumpectomy with radioactive seed placement. We discussed a 5-10% chance of a positive margin requiring reexcision in the operating room. We also discussed that she will likely need radiation therapy if she undergoes lumpectomy.  We discussed mastectomy and the postoperative care for that as well. Mastectomy can be followed by reconstruction. The decision for lumpectomy vs mastectomy has no impact on decision for chemotherapy. Most mastectomy patients will not need radiation therapy. We discussed that there is no difference in her survival whether she undergoes lumpectomy with radiation therapy or antiestrogen therapy versus a mastectomy. There is also no real difference between her recurrence in the breast.  I do not think she would have a great cosmetic result from another lumpectomy.  This is already quite smaller and indented from her prior surgery.  She is however interested in this and  saving at least some of the breast tissue there as well as her nipple areola.  I think this is reasonable as long as she understands that.  I gave her a list of the pros and cons of both surgeries.  She is going to discuss this with her daughter.  She is also going to discuss radiation with radiation oncology to understand more about that before we proceed.  We discussed the risks of operation including bleeding, infection, possible reoperation. She understands her further therapy will be based on what her stages at the time of her operation.     MATTHEW CARLIN BURY, MD

## 2024-02-15 NOTE — Telephone Encounter (Signed)
 Patient is being referred here and had questions about transportation, called and explained the process and that someone would be in touch once the appointment was made

## 2024-02-15 NOTE — Telephone Encounter (Signed)
   Pre-operative Risk Assessment    Patient Name: Victoria Cardenas  DOB: 01-19-1951 MRN: [id number]   Date of last office visit: 01/22/24 JOSEFA BEAUVAIS, NP Date of next office visit: NONE   Request for Surgical Clearance    Procedure:  RIGHT BREAST CANCER SURGERY  Date of Surgery:  Clearance TBD    (URGENT PER REQUEST)                        Surgeon:  DR DONNICE BURY Surgeon's Group or Practice Name:  [facility] Phone number:  [phone] Fax number:  [phone]       ATTN: RUSSELL CHRISTINE, CMA   Type of Clearance Requested:   - Medical    Type of Anesthesia:  General    Additional requests/questions:    Signed, Lucie DELENA Ku   02/15/2024, 5:17 PM

## 2024-02-16 NOTE — Telephone Encounter (Signed)
   Patient Name: Victoria Cardenas  DOB: Jan 29, 1951 MRN: [id number]  Primary Cardiologist: Soyla DELENA Merck, MD  Chart reviewed as part of pre-operative protocol coverage.   Patient was recently seen in clinic on 01/22/2024 by Josefa Beauvais, NP.  Per Josefa Beauvais, NP, she may proceed with planned surgery without further cardiac testing. Her RCRI is very low risk, 0.4% risk of major cardiac event.  Given past medical history and time since last visit, based on ACC/AHA guidelines, Victoria Cardenas is at acceptable risk for the planned procedure without further cardiovascular testing.   I will route this recommendation to the requesting party via Epic fax function and remove from pre-op pool.  Please call with questions.  Calel Pisarski D Niyati Heinke, NP 02/16/2024, 10:25 AM

## 2024-02-17 ENCOUNTER — Telehealth: Payer: Self-pay

## 2024-02-17 NOTE — Telephone Encounter (Signed)
 Breast Tumor Board  Danelle Curiale was presented on the breast tumor board today. She is a candidate for genetic testing. Erminio Moats meets National Comprehensive Cancer Network (NCCN) criteria for genetic testing for Hereditary Breast and Ovarian Cancer Syndrome based on her personal history of two primary breast cancers and her family history of breast cancer in mother, maternal grandmother and maternal aunt.  Santana Fryer, MS, CGC  Certified Genetic Counselor  Email: [email or web address]  Phone: [phone]-

## 2024-02-17 NOTE — Progress Notes (Signed)
 Location of Breast Cancer: Ductal Carcinoma in Situ of Right Breast    Histology per Pathology Report:    Receptor Status: ER(1% Positive), PR (Negative) Did patient present with symptoms (if so, please note symptoms) or was this found on screening mammography?:  Mammogram   Past/Anticipated interventions by surgeon, if any: 02/15/2024 [place],MD    Past/Anticipated interventions by medical oncology, if any: Chemotherapy ***  Lymphedema issues, if any:  {:18581} {t:21944}   Pain issues, if any:  {:18581} {PAIN DESCRIPTION:21022940}  SAFETY ISSUES: Prior radiation? {:18581} Pacemaker/ICD? {:18581} Possible current pregnancy?{:18581} Is the patient on methotrexate? {:18581}  Current Complaints / other details:  ***

## 2024-02-18 ENCOUNTER — Encounter: Payer: Self-pay | Admitting: Radiation Oncology

## 2024-02-18 NOTE — Progress Notes (Signed)
 Radiation Oncology         [phone] ________________________________  Initial Outpatient Consultation  Name: Victoria Cardenas MRN: [id number]  Date: 02/19/2024  DOB: [birth year]  RR:Ijbz, Glinda DASEN, FNP  Ebbie Cough, MD   REFERRING PHYSICIAN: Ebbie Cough, MD  DIAGNOSIS: No diagnosis found.   Cancer Staging  No matching staging information was found for the patient.  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 female with a history notable for right breast DCIS diagnosed in 2005, s/p lumpectomy (no record of XRT to the right breast ***). She also had a benign excisional biopsy of the left breast in 2019. She presents today to discuss the role of radiation therapy in management of her recent recurrence of right breast DCIS.  Her history pertaining to her recent diagnosis is detailed as follows.   She initially presented to medical attention this past March (2025) with palpable thickening to the left axilla. A bilateral diagnostic mammogram and left breast/axillary ultrasound was subsequently performed on 07/27/23 which demonstrated no mammographic or sonographic abnormality within the palpable area of concern in the left axilla, and no evidence of malignancy overall bilaterally. Mammographic findings did however include a 0.7 cm group of likely benign calcifications within the anterior inner right breast.   6 month follow-up imaging was ultimately recommended for the likely benign right breast calcs, and she accordingly presented for a right breast diagnostic mammogram on 02/04/24 which demonstrated an interval increase in the group of coarse calcifications in the inner right breast at an anterior depth,  spanning approximately 0.7 cm (similar to the prior study). No other abnormalities were demonstrated in the right breast.   Biopsy of the inner right breast calcifications on the date of 02/08/24 showed high-grade DCIS measuring 0.4 cm in the greatest linear extent of the sample, with necrosis.  ER status: 1% positive with moderate staining intensity; PR status 0% negative, Her2 not assessed.   She was recently seen in consultation by Dr. Ebbie on 02/15/24 to discuss surgical treatment options. Options discussed at that time included breast conserving surgery followed by XRT vs mastectomy followed by reconstructive surgery. Dr. Ebbie notes that she may not have a great cosmetic result from breast conserving surgery given that her right breast is already smaller and indented from her prior lumpectomy. Understandably, she would like to reflect on her options before making a final surgical decision.   ***  PREVIOUS RADIATION THERAPY: {EXAM; YES/NO:19492::No}  PAST MEDICAL HISTORY:  has a past medical history of Breast cancer (HCC) (2005), Dizziness, Glaucoma, Hypercholesteremia, Hypertension, Medication intolerance, Mild mitral regurgitation, NSVT (nonsustained ventricular tachycardia) (HCC), Paresthesia, Shortened PR interval, SVT (supraventricular tachycardia), and Thyroid  disease.    PAST SURGICAL HISTORY: Past Surgical History:  Procedure Laterality Date   ABDOMINAL HYSTERECTOMY     BREAST BIOPSY Right 02/08/2024   MM RT BREAST BX W LOC DEV 1ST LESION IMAGE BX SPEC STEREO GUIDE 02/08/2024 [facility]   BREAST EXCISIONAL BIOPSY Left 10/10/2015   benign   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    FAMILY HISTORY: family history includes Breast cancer in her maternal aunt and maternal grandmother; Breast cancer (age of onset: 34) in her mother; Heart attack in her  sister; Hypertension in her mother.  SOCIAL HISTORY:  reports that she has never smoked. She has never used smokeless  tobacco. She reports that she does not drink alcohol and does not use drugs.  ALLERGIES: Amlodipine, Penicillins, Alendronate , Diltiazem , Lisinopril , Losartan , Propranolol , Rosuvastatin , Ciprofloxacin, and Simvastatin  MEDICATIONS:  Current Outpatient Medications  Medication Sig Dispense Refill   brimonidine (ALPHAGAN P) 0.1 % SOLN 1 drop in both eyes daily     cholecalciferol (VITAMIN D3) 25 MCG (1000 UT) tablet Take 1,000 Units by mouth daily.     LUMIGAN 0.01 % SOLN Place 1 drop into both eyes at bedtime.     meclizine  (ANTIVERT ) 25 MG tablet Take 1 tablet (25 mg total) by mouth 3 (three) times daily as needed for dizziness. 30 tablet 0   methimazole  (TAPAZOLE ) 5 MG tablet Take 0.5 tablets (2.5 mg total) by mouth daily. 45 tablet 3   Multiple Minerals-Vitamins (CALCIUM -MAGNESIUM-ZINC-D3) TABS Take by mouth. 1 daily     No current facility-administered medications for this encounter.    REVIEW OF SYSTEMS: As above in HPI.   PHYSICAL EXAM:  vitals were not taken for this visit.   General: Alert and oriented, in no acute distress HEENT: Head is normocephalic. Extraocular movements are intact.  Heart: Regular in rate and rhythm with no murmurs, rubs, or gallops. Chest: Clear to auscultation bilaterally, with no rhonchi, wheezes, or rales. Abdomen: Soft, nontender, nondistended, with no rigidity or guarding. Extremities: No cyanosis or edema. Skin: No concerning lesions. Musculoskeletal: symmetric strength and muscle tone throughout. Neurologic: Cranial nerves II through XII are grossly intact. No obvious focalities. Speech is fluent. Coordination is intact. Psychiatric: Judgment and insight are intact. Affect is appropriate. Breasts: *** . No other palpable masses appreciated in the breasts or axillae *** .    ECOG = ***  0 - Asymptomatic (Fully active, able to carry on all predisease activities without restriction)  1 - Symptomatic but completely ambulatory (Restricted in  physically strenuous activity but ambulatory and able to carry out work of a light or sedentary nature. For example, light housework, office work)  2 - Symptomatic, <50% in bed during the day (Ambulatory and capable of all self care but unable to carry out any work activities. Up and about more than 50% of waking hours)  3 - Symptomatic, >50% in bed, but not bedbound (Capable of only limited self-care, confined to bed or chair 50% or more of waking hours)  4 - Bedbound (Completely disabled. Cannot carry on any self-care. Totally confined to bed or chair)  5 - Death   Raylene MM, Creech RH, Tormey DC, et al. [phone]). Toxicity and response criteria of the [facility]. Am. DOROTHA Bridges. Oncol. 5 (6): 649-55   LABORATORY DATA:   CBC    Component Value Date/Time   WBC 6.1 11/11/2023 1059   RBC 4.55 11/11/2023 1059   HGB 14.6 11/11/2023 1108   HGB 14.6 04/14/2019 0950   HCT 43.0 11/11/2023 1108   HCT 43.1 04/14/2019 0950   PLT 219 11/11/2023 1059   PLT 221 04/14/2019 0950   MCV 94.9 11/11/2023 1059   MCV 92 04/14/2019 0950   MCH 31.6 11/11/2023 1059   MCHC 33.3 11/11/2023 1059   RDW 11.8 11/11/2023 1059   RDW 11.9 04/14/2019 0950   LYMPHSABS 1.7 11/11/2023 1059   LYMPHSABS 2.0 08/06/2018 1042   MONOABS 0.5 11/11/2023 1059   EOSABS 0.1 11/11/2023 1059   EOSABS 0.2 08/06/2018 1042   BASOSABS  0.0 11/11/2023 1059   BASOSABS 0.0 08/06/2018 1042    CMP     Component Value Date/Time   NA 140 11/11/2023 1108   NA 142 01/07/2021 1012   K 3.9 11/11/2023 1108   CL 103 11/11/2023 1108   CO2 25 11/11/2023 1059   GLUCOSE 112 (H) 11/11/2023 1108   BUN 8 11/11/2023 1108   BUN 10 01/07/2021 1012   CREATININE 0.80 11/11/2023 1108   CALCIUM  8.8 (L) 11/11/2023 1059   PROT 6.5 11/11/2023 1059   PROT 6.9 05/13/2023 1256   ALBUMIN 3.7 11/11/2023 1059   ALBUMIN 4.6 05/13/2023 1256   AST 21 11/11/2023 1059   ALT 17 11/11/2023 1059   ALKPHOS 36 (L) 11/11/2023 1059    BILITOT 1.3 (H) 11/11/2023 1059   BILITOT 0.7 05/13/2023 1256   GFR 78.99 11/26/2022 1134   EGFR 78 01/07/2021 1012   GFRNONAA >60 11/11/2023 1059      RADIOGRAPHY: MM RT BREAST BX W LOC DEV 1ST LESION IMAGE BX SPEC STEREO GUIDE Addendum Date: 02/09/2024 ADDENDUM REPORT: 02/09/2024 13:26 ADDENDUM: PATHOLOGY revealed: 1. Breast, right, needle core biopsy, Inner calcifications ( x clip) - DUCTAL CARCINOMA IN SITU, CRIBRIFORM AND COMEDO, HIGH NUCLEAR GRADE - NECROSIS: PRESENT - CALCIFICATIONS: PRESENT - DCIS LENGTH: 0.4 CM. Pathology results are CONCORDANT with imaging findings, per Dr. Aliene Mir. Pathology results and recommendations were discussed with patient via telephone on 02/09/2024 by Jacquline Cooter RN. Patient reported biopsy site doing well with no adverse symptoms, and slight tenderness at the site. Post biopsy care instructions were reviewed, questions were answered and my direct phone number was provided. Patient was instructed to call [facility] for any additional questions or concerns related to biopsy site. RECOMMENDATION: Surgical consultation. Request for surgical consultation was relayed to [facility] and Olam Bunnell at [facility] by Jacquline Cooter RN on 02/09/2024 Pathology results reported by Jacquline Cooter RN on 02/09/2024. Electronically Signed   By: Aliene Lloyd M.D.   On: 02/09/2024 13:26   Result Date: 02/09/2024 CLINICAL DATA:  73 year old woman with history of RIGHT malignant lumpectomy for DCIS in 2005 presents for stereotactic guided biopsy of 0.7 cm group of RIGHT inner breast calcifications. EXAM: RIGHT BREAST STEREOTACTIC CORE NEEDLE BIOPSY COMPARISON:  Previous exam(s). FINDINGS: The patient and I discussed the procedure of stereotactic-guided biopsy including benefits and alternatives. We discussed the high likelihood of a successful procedure. We discussed the risks of the procedure including infection, bleeding, tissue  injury, clip migration, and inadequate sampling. Informed written consent was given. The usual time out protocol was performed immediately prior to the procedure. Using sterile technique and 1% Lidocaine as local anesthetic, under stereotactic guidance, a 9 gauge vacuum assisted device was used to perform core needle biopsy of calcifications in the inner RIGHT breast using a superior approach. Specimen radiograph was performed showing calcification. Specimens with calcifications are identified for pathology. Lesion quadrant: Upper inner quadrant At the conclusion of the procedure, X shaped tissue marker clip was deployed into the biopsy cavity. Follow-up 2-view mammogram was performed and dictated separately. IMPRESSION: Stereotactic-guided biopsy of 0.7 cm group of RIGHT inner breast calcification. No apparent complications. Electronically Signed: By: Aliene Lloyd M.D. On: 02/08/2024 13:36   MM CLIP PLACEMENT RIGHT Result Date: 02/08/2024 CLINICAL DATA:  Status post stereotactic guided biopsy of RIGHT inner breast calcifications. EXAM: 3D DIAGNOSTIC RIGHT MAMMOGRAM POST STEREOTACTIC BIOPSY COMPARISON:  Previous exam(s). ACR Breast Density Category b: There are scattered areas of fibroglandular  density. FINDINGS: 3D Mammographic images were obtained following stereotactic guided biopsy of RIGHT breast calcifications. The biopsy marking clip is in expected position at the site of biopsy. IMPRESSION: Appropriate positioning of the X shaped biopsy marking clip at the site of biopsy in the inner RIGHT breast. Final Assessment: Post Procedure Mammograms for Marker Placement Electronically Signed   By: Aliene Lloyd M.D.   On: 02/08/2024 13:37   MM 3D DIAGNOSTIC MAMMOGRAM UNILATERAL RIGHT BREAST Result Date: 02/04/2024 CLINICAL DATA:  73 year old woman with history of RIGHT malignant lumpectomy for DCIS in 2005 presents for six-month follow-up of probably benign RIGHT breast calcifications. EXAM: DIGITAL DIAGNOSTIC  UNILATERAL RIGHT MAMMOGRAM WITH TOMOSYNTHESIS AND CAD TECHNIQUE: Right digital diagnostic mammography and breast tomosynthesis was performed. The images were evaluated with computer-aided detection. COMPARISON:  Previous exam(s). ACR Breast Density Category b: There are scattered areas of fibroglandular density. FINDINGS: RIGHT: Mammogram: Full field CC and MLO and spot magnification CC and ML views of the RIGHT breast were obtained. Post lumpectomy changes of the RIGHT breast are again seen. Previously seen 0.7 cm group of coarse calcifications in the inner RIGHT breast at anterior depth have increased in number. No additional suspicious mass, distortion, or microcalcifications are identified to suggest presence of malignancy. IMPRESSION: Indeterminate 0.7 cm group of coarse RIGHT inner breast calcifications at anterior depth. RECOMMENDATION: Stereotactic guided core needle biopsy of 0.7 cm RIGHT inner breast calcifications. I have discussed the findings and recommendations with the patient. The biopsy procedure was explained to the patient and questions were answered. Patient expressed their understanding of the biopsy recommendation. Patient will be scheduled for biopsy at her earliest convenience by the schedulers. Ordering provider will be notified. If applicable, a reminder letter will be sent to the patient regarding the next appointment. BI-RADS CATEGORY  4: Suspicious. Electronically Signed   By: Aliene Lloyd M.D.   On: 02/04/2024 11:15      IMPRESSION/PLAN: ***   It was a pleasure meeting the patient today. We discussed the risks, benefits, and side effects of radiotherapy. I recommend radiotherapy to the *** to reduce her risk of locoregional recurrence by 2/3.  We discussed that radiation would take approximately *** weeks to complete and that I would give the patient a few weeks to heal following surgery before starting treatment planning. *** If chemotherapy were to be given, this would precede  radiotherapy. We spoke about acute effects including skin irritation and fatigue as well as much less common late effects including internal organ injury or irritation. We spoke about the latest technology that is used to minimize the risk of late effects for patients undergoing radiotherapy to the breast or chest wall. No guarantees of treatment were given. The patient is enthusiastic about proceeding with treatment. I look forward to participating in the patient's care.  I will await her referral back to me for postoperative follow-up and eventual CT simulation/treatment planning.  On date of service, in total, I spent *** minutes on this encounter. Patient was seen in person.   __________________________________________   Lauraine Golden, MD  This document serves as a record of services personally performed by Lauraine Golden, MD. It was created on her behalf by Dorthy Fuse, a trained medical scribe. The creation of this record is based on the scribe's personal observations and the provider's statements to them. This document has been checked and approved by the attending provider.

## 2024-02-19 ENCOUNTER — Inpatient Hospital Stay: Attending: Licensed Clinical Social Worker | Admitting: Licensed Clinical Social Worker

## 2024-02-19 ENCOUNTER — Ambulatory Visit
Admission: RE | Admit: 2024-02-19 | Discharge: 2024-02-19 | Disposition: A | Discharge status: Home / Self Care | Source: Ambulatory Visit | Attending: Radiation Oncology | Admitting: Radiation Oncology

## 2024-02-19 VITALS — BP 185/92 | HR 62 | Temp 97.5°F | Resp 20 | Ht 63.0 in | Wt 120.2 lb

## 2024-02-19 DIAGNOSIS — I471 Supraventricular tachycardia, unspecified: Secondary | ICD-10-CM | POA: Insufficient documentation

## 2024-02-19 DIAGNOSIS — Z170 Estrogen receptor positive status [ER+]: Secondary | ICD-10-CM | POA: Diagnosis not present

## 2024-02-19 DIAGNOSIS — Z1722 Progesterone receptor negative status: Secondary | ICD-10-CM | POA: Insufficient documentation

## 2024-02-19 DIAGNOSIS — E079 Disorder of thyroid, unspecified: Secondary | ICD-10-CM | POA: Insufficient documentation

## 2024-02-19 DIAGNOSIS — E7800 Pure hypercholesterolemia, unspecified: Secondary | ICD-10-CM | POA: Insufficient documentation

## 2024-02-19 DIAGNOSIS — I10 Essential (primary) hypertension: Secondary | ICD-10-CM | POA: Diagnosis not present

## 2024-02-19 DIAGNOSIS — Z853 Personal history of malignant neoplasm of breast: Secondary | ICD-10-CM | POA: Insufficient documentation

## 2024-02-19 DIAGNOSIS — D0511 Intraductal carcinoma in situ of right breast: Secondary | ICD-10-CM | POA: Insufficient documentation

## 2024-02-19 DIAGNOSIS — Z90710 Acquired absence of both cervix and uterus: Secondary | ICD-10-CM | POA: Diagnosis not present

## 2024-02-19 DIAGNOSIS — Z803 Family history of malignant neoplasm of breast: Secondary | ICD-10-CM | POA: Diagnosis not present

## 2024-02-19 NOTE — Progress Notes (Signed)
 [facility] Clinical Social Work  Clinical Social Work was referred by radiation oncology for transportation concerns.  Clinical Social Worker contacted patient by phone to offer support and assess for needs.    Patient is concerned about rides to treatment if she does daily radiation. She currently uses her [employer] transportation benefit but has a limited number of rides. She has used her Medicaid transportation in the past, but not currently.  She does not have family or friends who would be able to assist.   Interventions: Provided patient with information about steps to use insurance transportation benefit.  Discussed [facility] transportation and how it can only be used once insurance benefit has been fully utilized Informed patient of the support team and support services at [facility].   Provided contact information and encouraged patient to call with any questions or concerns.      Follow Up Plan:  Patient will contact CSW with any support or resource needs or if she is unable to utilize or maxes out her insurance transportation benefit    Xylon Croom E Saddie Sandeen, LCSW  Clinical Social Worker [employer] [facility]

## 2024-02-23 ENCOUNTER — Other Ambulatory Visit: Payer: Self-pay | Admitting: General Surgery

## 2024-02-23 DIAGNOSIS — D0511 Intraductal carcinoma in situ of right breast: Secondary | ICD-10-CM

## 2024-02-26 ENCOUNTER — Other Ambulatory Visit: Payer: Self-pay | Admitting: General Surgery

## 2024-02-26 ENCOUNTER — Encounter: Payer: Self-pay | Admitting: *Deleted

## 2024-02-26 DIAGNOSIS — D0511 Intraductal carcinoma in situ of right breast: Secondary | ICD-10-CM

## 2024-03-01 ENCOUNTER — Encounter: Payer: Self-pay | Admitting: *Deleted

## 2024-03-01 ENCOUNTER — Other Ambulatory Visit: Payer: Self-pay | Admitting: *Deleted

## 2024-03-01 DIAGNOSIS — D0511 Intraductal carcinoma in situ of right breast: Secondary | ICD-10-CM | POA: Insufficient documentation

## 2024-03-04 ENCOUNTER — Other Ambulatory Visit: Payer: Self-pay | Admitting: General Surgery

## 2024-03-04 NOTE — Progress Notes (Signed)
 PROVIDER:  DONNICE CARLIN BURY, MD  MRN: [id number] DOB: 04-16-1951 DATE OF ENCOUNTER: 03/04/2024 Subjective     Chief Complaint: Breast Cancer     History of Present Illness: 73 year old female who has a history in 2005 in [place]  of what sounds like a lumpectomy for DCIS.  She denies that she had radiation, chemotherapy and she did not take any medication afterwards.  She has been followed since then.  She was a 3-month follow-up for some probably benign right breast calcifications recently.  She has B density tissue.  She had a 7 mm group group of coarse calcifications in the inner right breast that have increased in number.  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  There is necrosis present.  This was a 1% ER positive and PR negative.  I saw her at [facility] recently and she wanted to try a lumpectomy.  She has thought about this and now would like to consider mastectomy.    Review of Systems: A complete review of systems was obtained from the patient.  I have reviewed this information and discussed as appropriate with the patient.  See HPI as well for other ROS.  Review of Systems  Eyes:  Positive for redness.  Gastrointestinal:  Positive for constipation.  All other systems reviewed and are negative.     Medical History: Past Medical History:  Diagnosis Date  . Glaucoma (increased eye pressure)   . History of cancer   . Thyroid  disease     Patient Active Problem List  Diagnosis  . Acid reflux  . Ear pressure, right  . Epigastric pain  . Essential (primary) hypertension  . Eustachian tube dysfunction, right  . Graves disease  . Heart palpitations  . Hyperglycemia  . Hyperlipidemia  . Multinodular goiter  . Personal history of colonic polyps  . Right lower quadrant pain  . Sensorineural hearing loss (SNHL), bilateral  . Sore throat, chronic  . Subclinical hyperthyroidism  . Cervical spondylosis  . Cervicalgia  . Elevated  blood-pressure reading, without diagnosis of hypertension  . Hyper reflexia  . Pain in right arm    History reviewed. No pertinent surgical history.     Current Outpatient Medications on File Prior to Visit  Medication Sig Dispense Refill  . ALPHAGAN P 0.1 % ophthalmic solution Place 1 drop into both eyes 2 (two) times daily    . calcium  carbonate-vitamin D3 (OS-CAL 500+D) 500 mg-5 mcg (200 unit) tablet Take by mouth    . LUMIGAN 0.01 % ophthalmic solution Instill 1 drop in OU every evening    . methIMAzole  (TAPAZOLE ) 5 MG tablet     . aspirin  81 MG EC tablet Take by mouth    . cloNIDine HCL (CATAPRES) 0.1 MG tablet 1 tablet Orally Once a day every evening for BP 30 days    . ezetimibe  (ZETIA ) 10 mg tablet Take 10 mg by mouth once daily    . fluticasone propionate (FLONASE) 50 mcg/actuation nasal spray Instill 2 puffs each nostril daily.    . potassium chloride  20 mEq/15 mL oral solution Take by mouth     No current facility-administered medications on file prior to visit.    Family History  Problem Relation Age of Onset  . Breast cancer Mother      Social History   Tobacco Use  Smoking Status Never  Smokeless Tobacco Never     Social History   Socioeconomic History  . Marital status:  Single  Tobacco Use  . Smoking status: Never  . Smokeless tobacco: Never  Vaping Use  . Vaping status: Never Used  Substance and Sexual Activity  . Alcohol use: Never  . Drug use: Never   Social Drivers of Health   Food Insecurity: No Food Insecurity (02/18/2024)   Received from [facility]   Hunger Vital Sign   . Within the past 12 months, you worried that your food would run out before you got the money to buy more.: Never true   . Within the past 12 months, the food you bought just didn't last and you didn't have money to get more.: Never true  Transportation Needs: No Transportation Needs (02/18/2024)   Received from [facility] - Transportation   . In the past 12  months, has lack of transportation kept you from medical appointments or from getting medications?: No   . In the past 12 months, has lack of transportation kept you from meetings, work, or from getting things needed for daily living?: No   Received from [employer]   Social Network    Objective:   Constitutional:      Appearance: Normal appearance.  Chest:  Breasts:    Right: No inverted nipple, mass or nipple discharge.     Left: No inverted nipple, mass or nipple discharge.        Comments: Right breast has a periareolar incision that has a fair amount of puckering associated with it.  The right breast is pretty significantly smaller than the left breast after her prior surgery.  This is not a great cosmetic result Lymphadenopathy:     Upper Body:     Right upper body: No supraclavicular or axillary adenopathy.     Left upper body: No supraclavicular or axillary adenopathy.  Neurological:     Mental Status: She is alert.     Assessment and Plan:     Diagnoses and all orders for this visit:  Ductal carcinoma in situ (DCIS) of right breast    Right mastectomy  She has considered options and has now elected to undergo a mastectomy. We discussed mastectomy and sn biopsy with risks and recovery. I think this is better option and agree with her   MATTHEW CARLIN BURY, MD

## 2024-03-09 NOTE — Progress Notes (Signed)
 Surgical Instructions   Your procedure is scheduled on March 16, 2024. Report to [facility] Main Entrance A at 6:30 A.M., then check in with the Admitting office. Any questions or running late day of surgery: call [phone]  Questions prior to your surgery date: call [phone], Monday-Friday, 8am-4pm. If you experience any cold or flu symptoms such as cough, fever, chills, shortness of breath, etc. between now and your scheduled surgery, please notify us  at the above number.     Remember:  Do not eat after midnight the night before your surgery   You may drink clear liquids until 5:30 the morning of your surgery.   Clear liquids allowed are: Water, Non-Citrus Juices (without pulp), Carbonated Beverages, Clear Tea (no milk, honey, etc.), Black Coffee Only (NO MILK, CREAM OR POWDERED CREAMER of any kind), and Gatorade. Patient Instructions  The night before surgery:  No food after midnight. ONLY clear liquids after midnight  The day of surgery (if you do NOT have diabetes):  Drink ONE (1) Pre-Surgery Clear Ensure by 5:30 the morning of surgery. Drink in one sitting. Do not sip.  This drink was given to you during your hospital  pre-op appointment visit.  Nothing else to drink after completing the  Pre-Surgery Clear Ensure.           If you have questions, please contact your surgeon's office.    Take these medicines the morning of surgery with A SIP OF WATER  brimonidine (ALPHAGAN P)  methimazole  (TAPAZOLE )   May take these medicines IF NEEDED: meclizine  (ANTIVERT )    One week prior to surgery, STOP taking any Aspirin  (unless otherwise instructed by your surgeon) Aleve, Naproxen, Ibuprofen, Motrin, Advil, Goody's, BC's, all herbal medications, fish oil, and non-prescription vitamins.                     Do NOT Smoke (Tobacco/Vaping) for 24 hours prior to your procedure.  If you use a CPAP at night, you may bring your mask/headgear for your overnight stay.    You will be asked to remove any contacts, glasses, piercing's, hearing aid's, dentures/partials prior to surgery. Please bring cases for these items if needed.    Patients discharged the day of surgery will not be allowed to drive home, and someone needs to stay with them for 24 hours.  SURGICAL WAITING ROOM VISITATION Patients may have no more than 2 support people in the waiting area - these visitors may rotate.   Pre-op nurse will coordinate an appropriate time for 1 ADULT support person, who may not rotate, to accompany patient in pre-op.  Children under the age of 34 must have an adult with them who is not the patient and must remain in the main waiting area with an adult.  If the patient needs to stay at the hospital during part of their recovery, the visitor guidelines for inpatient rooms apply.  Please refer to the [facility] website for the visitor guidelines for any additional information.   If you received a COVID test during your pre-op visit  it is requested that you wear a mask when out in public, stay away from anyone that may not be feeling well and notify your surgeon if you develop symptoms. If you have been in contact with anyone that has tested positive in the last 10 days please notify you surgeon.      Pre-operative CHG Bathing Instructions   You can play a key role in reducing the risk  of infection after surgery. Your skin needs to be as free of germs as possible. You can reduce the number of germs on your skin by washing with CHG (chlorhexidine gluconate) soap before surgery. CHG is an antiseptic soap that kills germs and continues to kill germs even after washing.   DO NOT use if you have an allergy to chlorhexidine/CHG or antibacterial soaps. If your skin becomes reddened or irritated, stop using the CHG and notify one of our RNs at [phone].              TAKE A SHOWER THE NIGHT BEFORE SURGERY   Please keep in mind the following:  DO NOT shave, including  legs and underarms, 48 hours prior to surgery.   You may shave your face before/day of surgery.  Place clean sheets on your bed the night before surgery Use a clean washcloth (not used since being washed) for shower. DO NOT sleep with pet's night before surgery.  CHG Shower Instructions:  Wash your face and private area with normal soap. If you choose to wash your hair, wash first with your normal shampoo.  After you use shampoo/soap, rinse your hair and body thoroughly to remove shampoo/soap residue.  Turn the water OFF and apply half the bottle of CHG soap to a CLEAN washcloth.  Apply CHG soap ONLY FROM YOUR NECK DOWN TO YOUR TOES (washing for 3-5 minutes)  DO NOT use CHG soap on face, private areas, open wounds, or sores.  Pay special attention to the area where your surgery is being performed.  If you are having back surgery, having someone wash your back for you may be helpful. Wait 2 minutes after CHG soap is applied, then you may rinse off the CHG soap.  Pat dry with a clean towel  Put on clean pajamas    Additional instructions for the day of surgery: If you choose, you may shower the morning of surgery with an antibacterial soap.  DO NOT APPLY any lotions, deodorants, cologne, or perfumes.   Do not wear jewelry or makeup Do not wear nail polish, gel polish, artificial nails, or any other type of covering on natural nails (fingers and toes) Do not bring valuables to the hospital. [facility] is not responsible for valuables/personal belongings. Put on clean/comfortable clothes.  Please brush your teeth.  Ask your nurse before applying any prescription medications to the skin.

## 2024-03-10 ENCOUNTER — Other Ambulatory Visit: Payer: Self-pay

## 2024-03-10 ENCOUNTER — Encounter (HOSPITAL_COMMUNITY): Payer: Self-pay

## 2024-03-10 ENCOUNTER — Encounter (HOSPITAL_COMMUNITY)
Admission: RE | Admit: 2024-03-10 | Discharge: 2024-03-10 | Disposition: A | Discharge status: Home / Self Care | Source: Ambulatory Visit | Attending: General Surgery | Admitting: General Surgery

## 2024-03-10 VITALS — BP 198/88 | HR 62 | Temp 97.7°F | Resp 18 | Ht 63.0 in | Wt 118.6 lb

## 2024-03-10 DIAGNOSIS — Z01812 Encounter for preprocedural laboratory examination: Secondary | ICD-10-CM | POA: Diagnosis present

## 2024-03-10 DIAGNOSIS — E039 Hypothyroidism, unspecified: Secondary | ICD-10-CM | POA: Diagnosis not present

## 2024-03-10 DIAGNOSIS — I10 Essential (primary) hypertension: Secondary | ICD-10-CM | POA: Insufficient documentation

## 2024-03-10 DIAGNOSIS — I340 Nonrheumatic mitral (valve) insufficiency: Secondary | ICD-10-CM | POA: Insufficient documentation

## 2024-03-10 DIAGNOSIS — I471 Supraventricular tachycardia, unspecified: Secondary | ICD-10-CM | POA: Diagnosis not present

## 2024-03-10 DIAGNOSIS — Z79899 Other long term (current) drug therapy: Secondary | ICD-10-CM | POA: Insufficient documentation

## 2024-03-10 DIAGNOSIS — Z01818 Encounter for other preprocedural examination: Secondary | ICD-10-CM

## 2024-03-10 DIAGNOSIS — E785 Hyperlipidemia, unspecified: Secondary | ICD-10-CM | POA: Diagnosis not present

## 2024-03-10 LAB — BASIC METABOLIC PANEL WITH GFR
Anion gap: 8 (ref 5–15)
BUN: 11 mg/dL (ref 8–23)
CO2: 27 mmol/L (ref 22–32)
Calcium: 8.8 mg/dL — ABNORMAL LOW (ref 8.9–10.3)
Chloride: 106 mmol/L (ref 98–111)
Creatinine, Ser: 0.86 mg/dL (ref 0.44–1.00)
GFR, Estimated: >60 mL/min (ref >60)
Glucose, Bld: 111 mg/dL — ABNORMAL HIGH (ref 70–99)
Potassium: 4.1 mmol/L (ref 3.5–5.1)
Sodium: 141 mmol/L (ref 135–145)

## 2024-03-10 LAB — CBC
HCT: 44.4 % (ref 36.0–46.0)
Hemoglobin: 14.8 g/dL (ref 12.0–15.0)
MCH: 31.3 pg (ref 26.0–34.0)
MCHC: 33.3 g/dL (ref 30.0–36.0)
MCV: 93.9 fL (ref 80.0–100.0)
Platelets: 219 K/uL (ref 150–400)
RBC: 4.73 MIL/uL (ref 3.87–5.11)
RDW: 11.9 % (ref 11.5–15.5)
WBC: 6 K/uL (ref 4.0–10.5)
nRBC: 0 % (ref 0.0–0.2)

## 2024-03-10 NOTE — Progress Notes (Addendum)
 PCP -  Glinda Like, FNP  Cardiologist - Gayatri A Acharya, MD (LOV with Josefa Beauvais, NP 01-22-24)  PPM/ICD - denies Device Orders - n/a Rep Notified - n/a  Chest x-ray - denies EKG - 11-11-23 Stress Test - 04-30-2011 (CE) ECHO - 10-05-2018 Cardiac Cath - denies  Sleep Study - denies CPAP - n/a  DM-denies  Blood Thinner Instructions:denies Aspirin  Instructions:denies  ERAS Protcol -clear liquids until 5:30 am. PRE-SURGERY Ensure   COVID TEST- n/a   Anesthesia review: Yes,   hX of Htn clearance. B/p elevated at PAT 198/88 per patient she is no longer taking bp meds she is unable tolerate them. Recheck of bp 163/94.  Patient denies shortness of breath, fever, cough and chest pain at PAT appointment   All instructions explained to the patient, with a verbal understanding of the material. Patient agrees to go over the instructions while at home for a better understanding. Patient also instructed to self quarantine after being tested for COVID-19. The opportunity to ask questions was provided.

## 2024-03-11 NOTE — Progress Notes (Signed)
 Anesthesia Chart Review:  73 year old female follows with cardiology for history of HLD, SVT, HTN (now off meds due to development of hypotension), palpitations, hyperthyroidism on Tapazole , mild mitral valve insufficiency.  Coronary CTA 12/2020 showed calcium  score 0, no significant coronary disease.  Echo 09/2018 showed LVEF 60 to 65%, normal RV systolic function, mild mitral digitation.  Preop clearance per telephone encounter by Katlyn West, NP on 02/16/2024, Patient was recently seen in clinic on 01/22/2024 by Josefa Beauvais, NP.  Per Josefa Beauvais, NP, she may proceed with planned surgery without further cardiac testing. Her RCRI is very low risk, 0.4% risk of major cardiac event. Given past medical history and time since last visit, based on ACC/AHA guidelines, Victoria Cardenas is at acceptable risk for the planned procedure without further cardiovascular testing.  Other pertinent history includes right breast cancer s/p lumpectomy and chemoradiation 2005.  Preop labs reviewed, unremarkable.  EKG 11/11/2023: Sinus rhythm.  Rate 58 Short PR interval. Right atrial enlargement. Borderline intraventricular conduction delay. Minimal ST depression, diffuse leads. Borderline ST elevation, anterior leads.  No significant change.  Coronary CTA 01/09/2021: IMPRESSION: 1. Coronary calcium  score 0 Agatston units, suggesting low risk for future cardiac events.   2.  No significant coronary disease noted.  TTE 10/05/2018: 1. The left ventricle has normal systolic function with an ejection  fraction of 60-65%. The cavity size was normal. Left ventricular diastolic  parameters were normal. No evidence of left ventricular regional wall  motion abnormalities.   2. The right ventricle has normal systolic function. The cavity was  normal. There is no increase in right ventricular wall thickness. Right  ventricular systolic pressure could not be assessed.   3. Mild thickening of the mitral valve leaflet. The MR  jet is  centrally-directed.   4. Mild thickening of the aortic valve. No stenosis of the aortic valve.     Lynwood Geofm RIGGERS [facility] Short Stay Center/Anesthesiology Phone [phone] 03/11/2024 4:17 PM

## 2024-03-11 NOTE — Anesthesia Preprocedure Evaluation (Addendum)
 Anesthesia Evaluation  Patient identified by MRN, date of birth, ID band Patient awake    Reviewed: Allergy & Precautions, NPO status , Patient's Chart, lab work & pertinent test results  Airway Mallampati: II  TM Distance: >3 FB Neck ROM: Full    Dental no notable dental hx.    Pulmonary neg pulmonary ROS   Pulmonary exam normal        Cardiovascular hypertension, Normal cardiovascular exam+ Valvular Problems/Murmurs MR      Neuro/Psych negative neurological ROS  negative psych ROS   GI/Hepatic negative GI ROS, Neg liver ROS,,,  Endo/Other  negative endocrine ROS    Renal/GU negative Renal ROS     Musculoskeletal negative musculoskeletal ROS (+)    Abdominal   Peds  Hematology negative hematology ROS (+)   Anesthesia Other Findings RIGHT BREAST CANCER  Reproductive/Obstetrics                              Anesthesia Physical Anesthesia Plan  ASA: 3  Anesthesia Plan: General and Regional   Post-op Pain Management: Regional block*   Induction: Intravenous  PONV Risk Score and Plan: 3 and Ondansetron, Dexamethasone, Propofol infusion, Treatment may vary due to age or medical condition and Midazolam  Airway Management Planned: Oral ETT  Additional Equipment:   Intra-op Plan:   Post-operative Plan: Extubation in OR  Informed Consent: I have reviewed the patients History and Physical, chart, labs and discussed the procedure including the risks, benefits and alternatives for the proposed anesthesia with the patient or authorized representative who has indicated his/her understanding and acceptance.     Dental advisory given  Plan Discussed with: CRNA  Anesthesia Plan Comments: (PAT note by Lynwood Hope, PA-C: 73 year old female follows with cardiology for history of HLD, SVT, HTN (now off meds due to development of hypotension), palpitations, hyperthyroidism on Tapazole , mild  mitral valve insufficiency.  Coronary CTA 12/2020 showed calcium  score 0, no significant coronary disease.  Echo 09/2018 showed LVEF 60 to 65%, normal RV systolic function, mild mitral digitation.  Preop clearance per telephone encounter by Katlyn West, NP on 02/16/2024, Patient was recently seen in clinic on 01/22/2024 by Josefa Beauvais, NP.  Per Josefa Beauvais, NP, she may proceed with planned surgery without further cardiac testing. Her RCRI is very low risk, 0.4% risk of major cardiac event. Given past medical history and time since last visit, based on ACC/AHA guidelines, Victoria Cardenas is at acceptable risk for the planned procedure without further cardiovascular testing.  Other pertinent history includes right breast cancer s/p lumpectomy and chemoradiation 2005.  Preop labs reviewed, unremarkable.  EKG 11/11/2023: Sinus rhythm.  Rate 58 Short PR interval. Right atrial enlargement. Borderline intraventricular conduction delay. Minimal ST depression, diffuse leads. Borderline ST elevation, anterior leads.  No significant change.  Coronary CTA 01/09/2021: IMPRESSION: 1. Coronary calcium  score 0 Agatston units, suggesting low risk for future cardiac events.  2.  No significant coronary disease noted.  TTE 10/05/2018: 1. The left ventricle has normal systolic function with an ejection  fraction of 60-65%. The cavity size was normal. Left ventricular diastolic  parameters were normal. No evidence of left ventricular regional wall  motion abnormalities.  2. The right ventricle has normal systolic function. The cavity was  normal. There is no increase in right ventricular wall thickness. Right  ventricular systolic pressure could not be assessed.  3. Mild thickening of the mitral valve leaflet. The MR jet is  centrally-directed.  4. Mild thickening of the aortic valve. No stenosis of the aortic valve.    )         Anesthesia Quick Evaluation

## 2024-03-15 ENCOUNTER — Encounter

## 2024-03-15 NOTE — H&P (Signed)
 73 year old female who has a history in 2005 in [place]  of what sounds like a lumpectomy for DCIS. She denies that she had radiation, chemotherapy and she did not take any medication afterwards. She has been followed since then. She was a 73-month follow-up for some probably benign right breast calcifications recently. She has B density tissue. She had a 7 mm group group of coarse calcifications in the inner right breast that have increased in number. 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 There is necrosis present. This was a 1% ER positive and PR negative. I saw her at [facility] recently and she wanted to try a lumpectomy. She has thought about this and now would like to consider mastectomy.   Review of Systems: A complete review of systems was obtained from the patient. I have reviewed this information and discussed as appropriate with the patient. See HPI as well for other ROS.  Review of Systems  Eyes: Positive for redness.  Gastrointestinal: Positive for constipation.  All other systems reviewed and are negative.   Medical History: Past Medical History:  Diagnosis Date  Glaucoma (increased eye pressure)  History of cancer  Thyroid  disease   Patient Active Problem List  Diagnosis  Acid reflux  Ear pressure, right  Epigastric pain  Essential (primary) hypertension  Eustachian tube dysfunction, right  Graves disease  Heart palpitations  Hyperglycemia  Hyperlipidemia  Multinodular goiter  Personal history of colonic polyps  Right lower quadrant pain  Sensorineural hearing loss (SNHL), bilateral  Sore throat, chronic  Subclinical hyperthyroidism  Cervical spondylosis  Cervicalgia  Elevated blood-pressure reading, without diagnosis of hypertension  Hyper reflexia  Pain in right arm   History reviewed. No pertinent surgical history.   Current Outpatient Medications on File Prior to Visit  Medication Sig Dispense Refill  ALPHAGAN P 0.1 %  ophthalmic solution Place 1 drop into both eyes 2 (two) times daily  calcium  carbonate-vitamin D3 (OS-CAL 500+D) 500 mg-5 mcg (200 unit) tablet Take by mouth  LUMIGAN 0.01 % ophthalmic solution Instill 1 drop in OU every evening  methIMAzole  (TAPAZOLE ) 5 MG tablet  aspirin  81 MG EC tablet Take by mouth  cloNIDine HCL (CATAPRES) 0.1 MG tablet 1 tablet Orally Once a day every evening for BP 30 days  ezetimibe  (ZETIA ) 10 mg tablet Take 10 mg by mouth once daily  fluticasone propionate (FLONASE) 50 mcg/actuation nasal spray Instill 2 puffs each nostril daily.  potassium chloride  20 mEq/15 mL oral solution Take by mouth    Family History  Problem Relation Age of Onset  Breast cancer Mother   Social History   Tobacco Use  Smoking Status Never  Smokeless Tobacco Never  Marital status: Single  Tobacco Use  Smoking status: Never  Smokeless tobacco: Never  Vaping Use  Vaping status: Never Used  Substance and Sexual Activity  Alcohol use: Never  Drug use: Never   Objective:   Constitutional:  Appearance: Normal appearance.  Chest:  Breasts: Right: No inverted nipple, mass or nipple discharge.  Left: No inverted nipple, mass or nipple discharge.  Comments: Right breast has a periareolar incision that has a fair amount of puckering associated with it. The right breast is pretty significantly smaller than the left breast after her prior surgery. This is not a great cosmetic result Lymphadenopathy:  Upper Body:  Right upper body: No supraclavicular or axillary adenopathy.  Left upper body: No supraclavicular or axillary adenopathy.  Neurological:  Mental Status: She is alert.  Assessment and Plan:   Ductal carcinoma in situ (DCIS) of right breast  Right mastectomy  She has considered options and has now elected to undergo a mastectomy. We discussed mastectomy and sn biopsy with risks and recovery. I think this is better option and agree with her

## 2024-03-16 ENCOUNTER — Encounter (HOSPITAL_COMMUNITY)
Admission: RE | Disposition: A | Discharge status: Home / Self Care | Payer: Self-pay | Source: Home / Self Care | Attending: General Surgery

## 2024-03-16 ENCOUNTER — Other Ambulatory Visit: Payer: Self-pay

## 2024-03-16 ENCOUNTER — Ambulatory Visit (HOSPITAL_COMMUNITY): Admitting: Anesthesiology

## 2024-03-16 ENCOUNTER — Encounter

## 2024-03-16 ENCOUNTER — Encounter (HOSPITAL_COMMUNITY): Payer: Self-pay | Admitting: General Surgery

## 2024-03-16 ENCOUNTER — Ambulatory Visit (HOSPITAL_COMMUNITY): Payer: Self-pay | Admitting: Physician Assistant

## 2024-03-16 ENCOUNTER — Observation Stay (HOSPITAL_COMMUNITY)
Admission: RE | Admit: 2024-03-16 | Discharge: 2024-03-17 | Disposition: A | Discharge status: Home / Self Care | Attending: General Surgery | Admitting: General Surgery

## 2024-03-16 DIAGNOSIS — H409 Unspecified glaucoma: Secondary | ICD-10-CM | POA: Diagnosis not present

## 2024-03-16 DIAGNOSIS — C50911 Malignant neoplasm of unspecified site of right female breast: Secondary | ICD-10-CM | POA: Diagnosis not present

## 2024-03-16 DIAGNOSIS — D0511 Intraductal carcinoma in situ of right breast: Secondary | ICD-10-CM | POA: Diagnosis present

## 2024-03-16 DIAGNOSIS — I10 Essential (primary) hypertension: Secondary | ICD-10-CM | POA: Insufficient documentation

## 2024-03-16 DIAGNOSIS — Z9011 Acquired absence of right breast and nipple: Principal | ICD-10-CM

## 2024-03-16 HISTORY — PX: TOTAL MASTECTOMY: SHX6129

## 2024-03-16 SURGERY — MASTECTOMY, SIMPLE
Anesthesia: General | Site: Breast | Laterality: Right

## 2024-03-16 MED ORDER — METHOCARBAMOL 500 MG PO TABS: 500.0000 mg | ORAL_TABLET | Freq: Three times a day (TID) | ORAL | Status: DC | PRN

## 2024-03-16 MED ORDER — ACETAMINOPHEN 500 MG PO TABS: 1000.0000 mg | ORAL_TABLET | ORAL | Status: AC

## 2024-03-16 MED ORDER — AMISULPRIDE (ANTIEMETIC) 5 MG/2ML IV SOLN: 10.0000 mg | Freq: Once | INTRAVENOUS | Status: DC | PRN

## 2024-03-16 MED ORDER — TRAMADOL HCL 50 MG PO TABS: 50.0000 mg | ORAL_TABLET | Freq: Four times a day (QID) | ORAL | Status: DC | PRN

## 2024-03-16 MED ORDER — CHLORHEXIDINE GLUCONATE 0.12 % MT SOLN
OROMUCOSAL | Status: AC
  Filled 2024-03-16: qty 15

## 2024-03-16 MED ORDER — ROCURONIUM BROMIDE 10 MG/ML (PF) SYRINGE
PREFILLED_SYRINGE | INTRAVENOUS | Status: AC
  Filled 2024-03-16: qty 10

## 2024-03-16 MED ORDER — FENTANYL CITRATE (PF) 250 MCG/5ML IJ SOLN
INTRAMUSCULAR | Status: DC | PRN
  Administered 2024-03-16 (×3): 50 ug via INTRAVENOUS

## 2024-03-16 MED ORDER — FENTANYL CITRATE (PF) 250 MCG/5ML IJ SOLN
INTRAMUSCULAR | Status: AC
  Filled 2024-03-16: qty 5

## 2024-03-16 MED ORDER — CHLORHEXIDINE GLUCONATE CLOTH 2 % EX PADS: 6.0000 | MEDICATED_PAD | Freq: Once | CUTANEOUS | Status: DC

## 2024-03-16 MED ORDER — PHENYLEPHRINE 80 MCG/ML (10ML) SYRINGE FOR IV PUSH (FOR BLOOD PRESSURE SUPPORT)
PREFILLED_SYRINGE | INTRAVENOUS | Status: AC
  Filled 2024-03-16: qty 10

## 2024-03-16 MED ORDER — ENSURE PRE-SURGERY PO LIQD: 296.0000 mL | Freq: Once | ORAL | Status: DC

## 2024-03-16 MED ORDER — SUGAMMADEX SODIUM 200 MG/2ML IV SOLN
INTRAVENOUS | Status: DC | PRN
  Administered 2024-03-16: 200 mg via INTRAVENOUS

## 2024-03-16 MED ORDER — ACETAMINOPHEN 325 MG PO TABS
650.0000 mg | ORAL_TABLET | Freq: Four times a day (QID) | ORAL | Status: DC
  Administered 2024-03-17 (×2): 650 mg via ORAL
  Filled 2024-03-16 (×2): qty 2

## 2024-03-16 MED ORDER — ONDANSETRON HCL 4 MG/2ML IJ SOLN
INTRAMUSCULAR | Status: AC
  Filled 2024-03-16: qty 2

## 2024-03-16 MED ORDER — BRIMONIDINE TARTRATE 0.15 % OP SOLN
1.0000 [drp] | Freq: Two times a day (BID) | OPHTHALMIC | Status: DC
  Administered 2024-03-16 – 2024-03-17 (×2): 1 [drp] via OPHTHALMIC
  Filled 2024-03-16: qty 5

## 2024-03-16 MED ORDER — TRANEXAMIC ACID 1000 MG/10ML IV SOLN
2000.0000 mg | Freq: Once | INTRAVENOUS | Status: AC
  Administered 2024-03-16: 2000 mg via TOPICAL
  Filled 2024-03-16: qty 20

## 2024-03-16 MED ORDER — ACETAMINOPHEN 500 MG PO TABS
ORAL_TABLET | ORAL | Status: AC
  Administered 2024-03-16: 1000 mg via ORAL
  Filled 2024-03-16: qty 2

## 2024-03-16 MED ORDER — CHLORHEXIDINE GLUCONATE 0.12 % MT SOLN: 15.0000 mL | Freq: Once | OROMUCOSAL | Status: DC

## 2024-03-16 MED ORDER — PHENYLEPHRINE 80 MCG/ML (10ML) SYRINGE FOR IV PUSH (FOR BLOOD PRESSURE SUPPORT)
PREFILLED_SYRINGE | INTRAVENOUS | Status: DC | PRN
  Administered 2024-03-16: 160 ug via INTRAVENOUS
  Administered 2024-03-16 (×2): 80 ug via INTRAVENOUS
  Administered 2024-03-16: 120 ug via INTRAVENOUS

## 2024-03-16 MED ORDER — PROPOFOL 10 MG/ML IV BOLUS
INTRAVENOUS | Status: DC | PRN
  Administered 2024-03-16: 100 mg via INTRAVENOUS

## 2024-03-16 MED ORDER — DEXAMETHASONE SOD PHOSPHATE PF 10 MG/ML IJ SOLN
INTRAMUSCULAR | Status: DC | PRN
  Administered 2024-03-16: 5 mg via INTRAVENOUS

## 2024-03-16 MED ORDER — METHOCARBAMOL 1000 MG/10ML IJ SOLN: 500.0000 mg | Freq: Three times a day (TID) | INTRAMUSCULAR | Status: DC | PRN

## 2024-03-16 MED ORDER — ONDANSETRON 4 MG PO TBDP: 4.0000 mg | ORAL_TABLET | Freq: Four times a day (QID) | ORAL | Status: DC | PRN

## 2024-03-16 MED ORDER — ONDANSETRON HCL 4 MG/2ML IJ SOLN: 4.0000 mg | Freq: Four times a day (QID) | INTRAMUSCULAR | Status: DC | PRN

## 2024-03-16 MED ORDER — HYPROMELLOSE (GONIOSCOPIC) 2.5 % OP SOLN: 1.0000 [drp] | OPHTHALMIC | Status: DC | PRN

## 2024-03-16 MED ORDER — CEFAZOLIN SODIUM-DEXTROSE 2-4 GM/100ML-% IV SOLN
2.0000 g | INTRAVENOUS | Status: AC
  Administered 2024-03-16: 2 g via INTRAVENOUS

## 2024-03-16 MED ORDER — 0.9 % SODIUM CHLORIDE (POUR BTL) OPTIME
TOPICAL | Status: DC | PRN
  Administered 2024-03-16: 1000 mL

## 2024-03-16 MED ORDER — ROCURONIUM BROMIDE 10 MG/ML (PF) SYRINGE
PREFILLED_SYRINGE | INTRAVENOUS | Status: DC | PRN
  Administered 2024-03-16: 40 mg via INTRAVENOUS

## 2024-03-16 MED ORDER — LIDOCAINE 2% (20 MG/ML) 5 ML SYRINGE
INTRAMUSCULAR | Status: AC
  Filled 2024-03-16: qty 5

## 2024-03-16 MED ORDER — ONDANSETRON HCL 4 MG/2ML IJ SOLN
INTRAMUSCULAR | Status: DC | PRN
  Administered 2024-03-16: 4 mg via INTRAVENOUS

## 2024-03-16 MED ORDER — LATANOPROST 0.005 % OP SOLN
1.0000 [drp] | Freq: Every day | OPHTHALMIC | Status: DC
  Filled 2024-03-16: qty 2.5

## 2024-03-16 MED ORDER — FENTANYL CITRATE (PF) 100 MCG/2ML IJ SOLN: 25.0000 ug | INTRAMUSCULAR | Status: DC | PRN

## 2024-03-16 MED ORDER — CEFAZOLIN SODIUM-DEXTROSE 2-4 GM/100ML-% IV SOLN
INTRAVENOUS | Status: AC
  Filled 2024-03-16: qty 100

## 2024-03-16 MED ORDER — ONDANSETRON HCL 4 MG/2ML IJ SOLN: 4.0000 mg | Freq: Once | INTRAMUSCULAR | Status: DC | PRN

## 2024-03-16 MED ORDER — ORAL CARE MOUTH RINSE: 15.0000 mL | Freq: Once | OROMUCOSAL | Status: DC

## 2024-03-16 MED ORDER — MECLIZINE HCL 25 MG PO TABS
25.0000 mg | ORAL_TABLET | Freq: Three times a day (TID) | ORAL | Status: DC | PRN
Start: 2024-03-16 — End: 2024-03-17

## 2024-03-16 MED ORDER — LIDOCAINE 2% (20 MG/ML) 5 ML SYRINGE
INTRAMUSCULAR | Status: DC | PRN
Start: 2024-03-16 — End: 2024-03-16
  Administered 2024-03-16: 40 mg via INTRAVENOUS

## 2024-03-16 MED ORDER — SIMETHICONE 80 MG PO CHEW: 40.0000 mg | CHEWABLE_TABLET | Freq: Four times a day (QID) | ORAL | Status: DC | PRN

## 2024-03-16 MED ORDER — PROPOFOL 10 MG/ML IV BOLUS
INTRAVENOUS | Status: AC
Start: 2024-03-16 — End: 2024-03-16
  Filled 2024-03-16: qty 20

## 2024-03-16 MED ORDER — ROPIVACAINE HCL 5 MG/ML IJ SOLN
INTRAMUSCULAR | Status: DC | PRN
  Administered 2024-03-16: 30 mL via PERINEURAL

## 2024-03-16 MED ORDER — METHIMAZOLE 2.5 MG HALF TABLET
2.5000 mg | ORAL_TABLET | Freq: Every day | ORAL | Status: DC
  Administered 2024-03-17: 2.5 mg via ORAL
  Filled 2024-03-16: qty 1

## 2024-03-16 MED ORDER — LACTATED RINGERS IV SOLN: INTRAVENOUS | Status: DC

## 2024-03-16 SURGICAL SUPPLY — 36 items
BAG COUNTER SPONGE SURGICOUNT (BAG) ×1 IMPLANT
BINDER BREAST LRG (GAUZE/BANDAGES/DRESSINGS) IMPLANT
BINDER BREAST XLRG (GAUZE/BANDAGES/DRESSINGS) IMPLANT
BIOPATCH RED 1 DISK 7.0 (GAUZE/BANDAGES/DRESSINGS) ×1 IMPLANT
CHLORAPREP W/TINT 26 (MISCELLANEOUS) ×1 IMPLANT
CLIP APPLIE 9.375 MED OPEN (MISCELLANEOUS) ×1 IMPLANT
COVER SURGICAL LIGHT HANDLE (MISCELLANEOUS) ×1 IMPLANT
DERMABOND ADVANCED .7 DNX12 (GAUZE/BANDAGES/DRESSINGS) ×1 IMPLANT
DRAIN CHANNEL 19F RND (DRAIN) ×1 IMPLANT
DRAPE TOP ARMCOVERS (MISCELLANEOUS) ×1 IMPLANT
DRAPE U-SHAPE 76X120 STRL (DRAPES) ×1 IMPLANT
DRSG TEGADERM 4X4.75 (GAUZE/BANDAGES/DRESSINGS) ×1 IMPLANT
ELECT CAUTERY BLADE 6.4 (BLADE) ×1 IMPLANT
ELECTRODE BLDE 4.0 EZ CLN MEGD (MISCELLANEOUS) ×1 IMPLANT
ELECTRODE REM PT RTRN 9FT ADLT (ELECTROSURGICAL) ×1 IMPLANT
EVACUATOR SILICONE 100CC (DRAIN) ×1 IMPLANT
GAUZE PAD ABD 8X10 STRL (GAUZE/BANDAGES/DRESSINGS) ×2 IMPLANT
GLOVE BIO SURGEON STRL SZ7 (GLOVE) ×1 IMPLANT
GLOVE BIOGEL PI IND STRL 7.5 (GLOVE) ×1 IMPLANT
GOWN STRL REUS W/ TWL LRG LVL3 (GOWN DISPOSABLE) ×3 IMPLANT
ILLUMINATOR WAVEGUIDE N/F (MISCELLANEOUS) IMPLANT
KIT BASIN OR (CUSTOM PROCEDURE TRAY) ×1 IMPLANT
KIT TURNOVER KIT B (KITS) ×1 IMPLANT
LIGHT WAVEGUIDE WIDE FLAT (MISCELLANEOUS) IMPLANT
PACK GENERAL/GYN (CUSTOM PROCEDURE TRAY) ×1 IMPLANT
PAD ARMBOARD POSITIONER FOAM (MISCELLANEOUS) ×1 IMPLANT
PENCIL SMOKE EVACUATOR (MISCELLANEOUS) ×1 IMPLANT
POWDER SURGICEL 3.0 GRAM (HEMOSTASIS) IMPLANT
SOLN 0.9% NACL POUR BTL 1000ML (IV SOLUTION) ×1 IMPLANT
STRIP CLOSURE SKIN 1/2X4 (GAUZE/BANDAGES/DRESSINGS) ×1 IMPLANT
SUT ETHILON 2 0 FS 18 (SUTURE) ×1 IMPLANT
SUT MNCRL AB 4-0 PS2 18 (SUTURE) ×1 IMPLANT
SUT SILK 2 0 SH (SUTURE) ×1 IMPLANT
SUT VIC AB 3-0 SH 18 (SUTURE) ×1 IMPLANT
TOWEL GREEN STERILE (TOWEL DISPOSABLE) ×1 IMPLANT
TOWEL GREEN STERILE FF (TOWEL DISPOSABLE) ×1 IMPLANT

## 2024-03-16 NOTE — Progress Notes (Signed)
 Lunch tray ordered for pt.

## 2024-03-16 NOTE — Anesthesia Procedure Notes (Signed)
 Anesthesia Regional Block: Pectoralis block   Pre-Anesthetic Checklist: , timeout performed,  Correct Patient, Correct Site, Correct Laterality,  Correct Procedure, Correct Position, site marked,  Risks and benefits discussed,  Surgical consent,  Pre-op evaluation,  At surgeon's request and post-op pain management  Laterality: Right  Prep: chloraprep       Needles:  Injection technique: Single-shot  Needle Type: Echogenic Stimulator Needle     Needle Length: 10cm  Needle Gauge: 20     Additional Needles:   Procedures:,,,, ultrasound used (permanent image in chart),,    Narrative:  Start time: 03/16/2024 8:00 AM End time: 03/16/2024 8:10 AM Injection made incrementally with aspirations every 5 mL.  Performed by: Personally  Anesthesiologist: Patrisha Bernardino SQUIBB, MD  Additional Notes: Functioning IV was confirmed and monitors were applied.  A timeout was performed. Sterile prep, hand hygiene and sterile gloves were used. A 20ga Bbraun echogenic stimulator needle was used. Negative aspiration and negative test dose prior to incremental administration of local anesthetic. The patient tolerated the procedure well.  Ultrasound guidance: relevent anatomy identified, needle position confirmed, local anesthetic spread visualized around nerve(s), vascular puncture avoided.  Image printed for medical record.

## 2024-03-16 NOTE — Progress Notes (Signed)
   03/16/24 1742  Closed System Drain 1 Right Breast Bulb (JP) 19 Fr.  Placement Date/Time: 03/16/24 0918   Person Inserting LDA: Dr. Ebbie Denis Number: 1  Orientation: Right  Location: Breast  Drain Tube Type: Bulb (JP)  Size (Fr.): 19 Fr.  Site Description Unremarkable  Dressing Status Clean, Dry, Intact  Drainage Appearance Serosanguineous  Status To suction (Charged)  Output (mL) 20 mL

## 2024-03-16 NOTE — Interval H&P Note (Signed)
 History and Physical Interval Note:  03/16/2024 8:02 AM  Victoria Cardenas  has presented today for surgery, with the diagnosis of RIGHT BREAST CANCER.  The various methods of treatment have been discussed with the patient and family. After consideration of risks, benefits and other options for treatment, the patient has consented to  Procedure(s) with comments: MASTECTOMY, SIMPLE (Right) - RIGHT BREAST MASTECTOMY as a surgical intervention.  The patient's history has been reviewed, patient examined, no change in status, stable for surgery.  I have reviewed the patient's chart and labs.  Questions were answered to the patient's satisfaction.     Donnice Bury

## 2024-03-16 NOTE — Discharge Instructions (Signed)
 [facility] [phone]  MASTECTOMY: POST OP INSTRUCTIONS Take 400 mg of ibuprofen every 8 hours or 650 mg tylenol  every 6 hours for next 72 hours then as needed. Use ice several times daily also.   A prescription for pain medication may be given to you upon discharge.  Take your pain medication as prescribed, if needed.  If narcotic pain medicine is not needed, then you may take acetaminophen  (Tylenol ), naprosyn (Alleve) or ibuprofen (Advil) as needed. Take your usually prescribed medications unless otherwise directed. You should follow a light diet the first 24 hours after surgery.  Resume your normal diet the day after surgery. Most patients will experience some swelling and bruising on the chest and underarm.  Ice packs will help.  Swelling and bruising can take several days to resolve. Wear the binder or surgical bra for 72 hours day and night. After that please wear during the day when up and around only. You do not have to wear at night unless you want to.  It is common to experience some constipation if taking pain medication after surgery.  Increasing fluid intake and taking a stool softener (such as Colace) will usually help or prevent this problem from occurring.  A mild laxative (Milk of Magnesia or Miralax ) should be taken according to package instructions if there are no bowel movements after 48 hours. There is glue and steristrips on your incision. They will come off in the next few weeks.  You may take a shower 48 hours after surgery.  Any sutures will be removed at an office visit DRAINS:  If you have drains in place, it is important to keep a list of the amount of drainage produced each day in your drains.  Before leaving the hospital, you should be instructed on drain care.  Call our office if you have any questions about your drains. I will remove your drains when they put out less than 30 cc or ml for 2 consecutive days. We will communicate with you about drain output  and have you come in when ready to be removed.  ACTIVITIES:  You may resume regular (light) daily activities beginning the next day--such as daily self-care, walking, climbing stairs--gradually increasing activities as tolerated.  You may have sexual intercourse when it is comfortable.  Refrain from any heavy lifting or straining until approved by your doctor. You may drive when you are no longer taking prescription pain medication, you can comfortably wear a seatbelt, and you can safely maneuver your car and apply brakes. RETURN TO WORK:  __________________________________________________________ Victoria Cardenas should see your doctor in the office for a follow-up appointment approximately 2-3 after your surgery.  Your doctor's nurse will typically make your follow-up appointment when she calls you with your pathology report.  Expect your pathology report 3-4 business days after surgery. WHEN TO CALL YOUR DR Tiny Rietz: Fever over 101.0 Nausea and/or vomiting Extreme swelling or bruising Continued bleeding from incision. Increased pain, redness, or drainage from the incision. The clinic staff is available to answer your questions during regular business hours.  Please don't hesitate to call and ask to speak to one of the nurses for clinical concerns.  If you have a medical emergency, go to the nearest emergency room or call 911.  A surgeon from [facility] is always on call at the hospital.  [address], [address], [place], [place]  [address] ? [address] [address], [place], [place]   [address] [phone] ? [phone] ? [phone] Web site: [email or web address]

## 2024-03-16 NOTE — Transfer of Care (Signed)
 Immediate Anesthesia Transfer of Care Note  Patient: Victoria Cardenas  Procedure(s) Performed: MASTECTOMY, SIMPLE (Right: Breast)  Patient Location: PACU  Anesthesia Type:General  Level of Consciousness: awake, sedated, and patient cooperative  Airway & Oxygen Therapy: Patient Spontanous Breathing and Patient connected to nasal cannula oxygen  Post-op Assessment: Report given to RN, Post -op Vital signs reviewed and stable, and Patient moving all extremities  Post vital signs: Reviewed  Last Vitals:  Vitals Value Taken Time  BP 155/90 03/16/24 09:45  Temp    Pulse 58 03/16/24 09:47  Resp 21 03/16/24 09:47  SpO2 100 % 03/16/24 09:47  Vitals shown include unfiled device data.  Last Pain:  Vitals:   03/16/24 0700  TempSrc:   PainSc: 0-No pain         Complications: No notable events documented.

## 2024-03-16 NOTE — Op Note (Signed)
 Preoperative diagnosis: Clinical stage 0 right breast cancer Postoperative diagnosis: Same as above Procedure: Right mastectomy Surgeon: Dr. 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  She did not have any other therapy.  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  She had a fair amount of scarring as well as an indentation from her prior surgery.  We initially discussed lumpectomy then she decided to proceed with a mastectomy.  Procedure:After informed consent obtained she was taken to the OR.  She had undergone a pectoral block.  SCDs were in place.  She was given antibiotics.  She was then placed under general anesthesia without complication.  She was prepped and draped in a standard sterile surgical fashion.  A surgical timeout was then performed.  I then made an elliptical incision to encompass the nipple areola as well as her old scar.  The inferior limb of this was in the inframammary fold.  I then created flaps to the sternum, clavicle, inframammary fold as well as the latissimus laterally.  I then removed the breast tissue as well as the pectoralis fascia and marked this as above.  Hemostasis was obtained.  I placed a TXA soaked sponge in this for 5 minutes.  I looked again and there was hemostasis.  I then placed a 50 French Blake drain and secured this with a 2-0 nylon suture.  I undermined the inframammary fold for about 6 cm for closure.  I then closed the dermis with 3-0 Vicryl's.  The skin was  closed with 4 Monocryl.  Glue and some Steri-Strips were applied.  I did place 1 mattress 2-0 nylon suture in the middle to take some of the tension off.  She tolerated this well was extubated and transferred recovery stable.

## 2024-03-16 NOTE — Anesthesia Procedure Notes (Signed)
 Procedure Name: Intubation Date/Time: 03/16/2024 8:47 AM  Performed by: Chaney Ozell CROME, CRNAPre-anesthesia Checklist: Patient identified, Emergency Drugs available, Suction available, Patient being monitored and Timeout performed Patient Re-evaluated:Patient Re-evaluated prior to induction Oxygen Delivery Method: Circle system utilized Preoxygenation: Pre-oxygenation with 100% oxygen Induction Type: IV induction Ventilation: Mask ventilation without difficulty Laryngoscope Size: Mac and 3 Grade View: Grade II Tube type: Oral Tube size: 7.0 mm Number of attempts: 1 Airway Equipment and Method: Stylet Placement Confirmation: ETT inserted through vocal cords under direct vision, positive ETCO2, CO2 detector and breath sounds checked- equal and bilateral Secured at: 21 cm

## 2024-03-16 NOTE — Progress Notes (Signed)
 Pt arrived to 6 north room 2 from PACU alert and oriented x4. Pain level 1/10. Breast incision clean dry and intact, steristrips in place. JP drain in charge position. Bed in lowest position. Call light in reach. All needs met at this time.

## 2024-03-17 ENCOUNTER — Encounter (HOSPITAL_COMMUNITY): Payer: Self-pay | Admitting: General Surgery

## 2024-03-17 ENCOUNTER — Other Ambulatory Visit: Payer: Self-pay | Admitting: Internal Medicine

## 2024-03-17 DIAGNOSIS — D0511 Intraductal carcinoma in situ of right breast: Secondary | ICD-10-CM | POA: Diagnosis not present

## 2024-03-17 MED ORDER — TRAMADOL HCL 50 MG PO TABS: 50.0000 mg | ORAL_TABLET | Freq: Four times a day (QID) | ORAL | 0 refills | Status: AC | PRN

## 2024-03-17 MED ORDER — METHOCARBAMOL 500 MG PO TABS: 500.0000 mg | ORAL_TABLET | Freq: Three times a day (TID) | ORAL | 1 refills | Status: AC | PRN

## 2024-03-17 NOTE — Discharge Summary (Signed)
 Physician Discharge Summary  Patient ID: Victoria Cardenas MRN: [id number] DOB/AGE: 01-Mar-1951 73 y.o.  Admit date: 03/16/2024 Discharge date: 03/17/2024  Admission Diagnoses: Breast cancer Glaucome HTN  Discharge Diagnoses:  Principal Problem:   S/P mastectomy, right   Discharged Condition: good  Hospital Course: 73 yof s/p right mastectomy for dcis after prior breast cancer. Doing well following am ready for dc  Consults: None  Significant Diagnostic Studies: none  Treatments: surgery: right mastectomy  Discharge Exam: Blood pressure 132/76, pulse 62, temperature 98.2 F (36.8 C), temperature source Oral, resp. rate 18, height 5' 3 (1.6 m), weight 53.5 kg, SpO2 100%. No hematoma, flaps viable, drain as expected  Disposition: Discharge disposition: 01-Home or Self Care        Allergies as of 03/17/2024       Reactions   Amlodipine Swelling, Palpitations   Swelling, heart pounding   Penicillins Hives   Has patient had a PCN reaction causing immediate rash, facial/tongue/throat swelling, SOB or lightheadedness with hypotension: yes Has patient had a PCN reaction causing severe rash involving mucus membranes or skin necrosis: no Has patient had a PCN reaction that required hospitalization: no Has patient had a PCN reaction occurring within the last 10 years: no If all of the above answers are NO, then may proceed with Cephalosporin Other Reaction(s): hives, throat swelling per patient   Alendronate  Diarrhea, Nausea Only   Diltiazem  Other (See Comments)   Pt reports this med causes her fatigue, dizziness, lightheaded, sore throat, and hard swallowing meals. Other Reaction(s): sore throat, dizziness   Lisinopril     Patient refused prior rx due to potential side effects outlined in medication information Other Reaction(s): did not take due to concerns about possible side effects   Losartan  Other (See Comments)   Pt reports causes frequent urination and  UTIs Other Reaction(s): frequent urination, UTI   Propranolol  Other (See Comments)   Pt reports causes flushing   Rosuvastatin  Other (See Comments)   Pt reports caused her to feel nauseated, dizzy, and lightheaded.    Ciprofloxacin Palpitations   Simvastatin Other (See Comments)   Increased urination        Medication List     TAKE these medications    brimonidine 0.1 % Soln Commonly known as: ALPHAGAN P Place 1 drop into both eyes in the morning and at bedtime.   Calcium -Magnesium-Zinc-D3 Tabs Take 1 tablet by mouth daily.   cholecalciferol 25 MCG (1000 UNIT) tablet Commonly known as: VITAMIN D3 Take 1,000 Units by mouth daily.   hydroxypropyl methylcellulose / hypromellose 2.5 % ophthalmic solution Commonly known as: ISOPTO TEARS / GONIOVISC Place 1 drop into both eyes as needed for dry eyes.   Lumigan 0.01 % Soln Generic drug: bimatoprost Place 1 drop into both eyes at bedtime.   meclizine  25 MG tablet Commonly known as: ANTIVERT  Take 1 tablet (25 mg total) by mouth 3 (three) times daily as needed for dizziness.   methimazole  5 MG tablet Commonly known as: TAPAZOLE  Take 0.5 tablets (2.5 mg total) by mouth daily.   methocarbamol 500 MG tablet Commonly known as: ROBAXIN Take 1 tablet (500 mg total) by mouth every 8 (eight) hours as needed for muscle spasms.   traMADol 50 MG tablet Commonly known as: ULTRAM Take 1 tablet (50 mg total) by mouth every 6 (six) hours as needed (mild pain).        Follow-up Information     Ebbie Cough, MD Follow up in 3 week(s).   Specialty:  General Surgery Contact information: [address] [place] [address] [phone]                 Signed: Donnice Bury 03/17/2024, 7:44 AM

## 2024-03-17 NOTE — Plan of Care (Signed)

## 2024-03-17 NOTE — Progress Notes (Signed)
 JP draining education and demonstration of emptying provided with good understanding verbalized and demonstrated.

## 2024-03-17 NOTE — Anesthesia Postprocedure Evaluation (Signed)
 Anesthesia Post Note  Patient: Victoria Cardenas  Procedure(s) Performed: MASTECTOMY, SIMPLE (Right: Breast)     Patient location during evaluation: PACU Anesthesia Type: General and Regional Level of consciousness: awake Pain management: pain level controlled Vital Signs Assessment: post-procedure vital signs reviewed and stable Respiratory status: spontaneous breathing, nonlabored ventilation and respiratory function stable Cardiovascular status: blood pressure returned to baseline and stable Postop Assessment: no apparent nausea or vomiting Anesthetic complications: no   No notable events documented.  Last Vitals:  Vitals:   03/17/24 0443 03/17/24 0800  BP: 132/76 (!) 177/94  Pulse: 62 69  Resp: 18 16  Temp: 36.8 C 36.6 C  SpO2: 100% 100%    Last Pain:  Vitals:   03/17/24 0816  TempSrc:   PainSc: 0-No pain                 [name]

## 2024-03-18 LAB — SURGICAL PATHOLOGY

## 2024-03-22 ENCOUNTER — Other Ambulatory Visit (HOSPITAL_BASED_OUTPATIENT_CLINIC_OR_DEPARTMENT_OTHER)

## 2024-05-06 ENCOUNTER — Ambulatory Visit (HOSPITAL_BASED_OUTPATIENT_CLINIC_OR_DEPARTMENT_OTHER)

## 2024-05-12 ENCOUNTER — Other Ambulatory Visit

## 2024-05-23 ENCOUNTER — Other Ambulatory Visit (HOSPITAL_COMMUNITY)

## 2024-06-08 ENCOUNTER — Ambulatory Visit (HOSPITAL_COMMUNITY)

## 2024-07-25 ENCOUNTER — Ambulatory Visit: Admitting: Internal Medicine

## 2024-10-11 ENCOUNTER — Other Ambulatory Visit (HOSPITAL_BASED_OUTPATIENT_CLINIC_OR_DEPARTMENT_OTHER)
